# Patient Record
Sex: Female | Born: 1965 | Race: White | Hispanic: No | Marital: Married | Smoking: Current every day smoker
Health system: Southern US, Community
[De-identification: ages and names within clinical notes are randomized; demographics above are authoritative.]

## PROBLEM LIST (undated history)

## (undated) DIAGNOSIS — G4733 Obstructive sleep apnea (adult) (pediatric): Secondary | ICD-10-CM

## (undated) DIAGNOSIS — F17200 Nicotine dependence, unspecified, uncomplicated: Secondary | ICD-10-CM

## (undated) DIAGNOSIS — E039 Hypothyroidism, unspecified: Secondary | ICD-10-CM

## (undated) DIAGNOSIS — J9611 Chronic respiratory failure with hypoxia: Secondary | ICD-10-CM

## (undated) DIAGNOSIS — Z9981 Dependence on supplemental oxygen: Secondary | ICD-10-CM

## (undated) DIAGNOSIS — J449 Chronic obstructive pulmonary disease, unspecified: Secondary | ICD-10-CM

## (undated) DIAGNOSIS — Z6841 Body Mass Index (BMI) 40.0 and over, adult: Secondary | ICD-10-CM

## (undated) DIAGNOSIS — I10 Essential (primary) hypertension: Secondary | ICD-10-CM

## (undated) DIAGNOSIS — Z91148 Patient's other noncompliance with medication regimen for other reason: Secondary | ICD-10-CM

## (undated) DIAGNOSIS — I5030 Unspecified diastolic (congestive) heart failure: Secondary | ICD-10-CM

## (undated) DIAGNOSIS — J45909 Unspecified asthma, uncomplicated: Secondary | ICD-10-CM

## (undated) HISTORY — PX: CHOLECYSTECTOMY: SHX55

## (undated) HISTORY — PX: INNER EAR SURGERY: SHX679

## (undated) HISTORY — DX: Hypothyroidism, unspecified: E03.9

## (undated) HISTORY — PX: FOOT SURGERY: SHX648

## (undated) MED FILL — Medication: Fill #0 | Status: CN

---

## 2004-07-27 ENCOUNTER — Ambulatory Visit: Payer: Self-pay | Admitting: General Surgery

## 2005-09-23 ENCOUNTER — Ambulatory Visit: Payer: Self-pay | Admitting: Family Medicine

## 2005-09-23 ENCOUNTER — Other Ambulatory Visit: Payer: Self-pay

## 2005-10-01 ENCOUNTER — Ambulatory Visit: Payer: Self-pay | Admitting: Podiatry

## 2005-10-07 ENCOUNTER — Ambulatory Visit: Payer: Self-pay | Admitting: Family Medicine

## 2006-03-25 ENCOUNTER — Ambulatory Visit: Payer: Self-pay

## 2006-04-04 ENCOUNTER — Ambulatory Visit: Payer: Self-pay | Admitting: Unknown Physician Specialty

## 2006-05-02 ENCOUNTER — Ambulatory Visit: Payer: Self-pay | Admitting: Unknown Physician Specialty

## 2006-05-12 ENCOUNTER — Emergency Department: Payer: Self-pay | Admitting: Emergency Medicine

## 2009-02-02 ENCOUNTER — Emergency Department: Payer: Self-pay | Admitting: Emergency Medicine

## 2011-01-04 ENCOUNTER — Ambulatory Visit: Payer: Self-pay | Admitting: "Endocrinology

## 2012-09-27 ENCOUNTER — Ambulatory Visit: Payer: Self-pay

## 2013-12-12 ENCOUNTER — Ambulatory Visit: Payer: Self-pay | Admitting: Internal Medicine

## 2014-10-18 ENCOUNTER — Other Ambulatory Visit: Payer: Self-pay | Admitting: Internal Medicine

## 2014-10-18 DIAGNOSIS — R319 Hematuria, unspecified: Secondary | ICD-10-CM

## 2014-10-30 LAB — LIPID PANEL
Cholesterol: 193 mg/dL (ref 0–200)
HDL: 53 mg/dL (ref 35–70)
LDL CALC: 110 mg/dL
TRIGLYCERIDES: 152 mg/dL (ref 40–160)

## 2014-11-06 ENCOUNTER — Ambulatory Visit: Payer: Self-pay | Admitting: Internal Medicine

## 2014-12-16 ENCOUNTER — Other Ambulatory Visit: Payer: Self-pay

## 2014-12-16 ENCOUNTER — Ambulatory Visit
Admission: RE | Admit: 2014-12-16 | Discharge: 2014-12-16 | Disposition: A | Discharge status: Home / Self Care | Payer: Self-pay | Source: Ambulatory Visit | Attending: Internal Medicine | Admitting: Internal Medicine

## 2014-12-16 ENCOUNTER — Ambulatory Visit
Admission: RE | Admit: 2014-12-16 | Discharge: 2014-12-16 | Disposition: A | Discharge status: Home / Self Care | Payer: Self-pay | Source: Ambulatory Visit | Attending: *Deleted | Admitting: *Deleted

## 2014-12-16 DIAGNOSIS — K760 Fatty (change of) liver, not elsewhere classified: Secondary | ICD-10-CM | POA: Insufficient documentation

## 2014-12-16 DIAGNOSIS — R319 Hematuria, unspecified: Secondary | ICD-10-CM | POA: Insufficient documentation

## 2014-12-16 DIAGNOSIS — Z9049 Acquired absence of other specified parts of digestive tract: Secondary | ICD-10-CM | POA: Insufficient documentation

## 2014-12-16 HISTORY — DX: Unspecified asthma, uncomplicated: J45.909

## 2014-12-16 HISTORY — DX: Essential (primary) hypertension: I10

## 2014-12-16 MED ORDER — IOHEXOL 300 MG/ML  SOLN
100.0000 mL | Freq: Once | INTRAMUSCULAR | Status: AC | PRN
  Administered 2014-12-16: 100 mL via INTRAVENOUS

## 2015-02-05 ENCOUNTER — Other Ambulatory Visit: Payer: Self-pay

## 2015-02-12 ENCOUNTER — Ambulatory Visit: Payer: Self-pay | Admitting: Internal Medicine

## 2015-02-12 ENCOUNTER — Ambulatory Visit: Payer: Self-pay

## 2015-02-19 ENCOUNTER — Ambulatory Visit: Payer: Self-pay | Admitting: Internal Medicine

## 2015-02-27 ENCOUNTER — Ambulatory Visit: Payer: Self-pay | Admitting: Ophthalmology

## 2015-03-06 ENCOUNTER — Other Ambulatory Visit: Payer: Self-pay | Admitting: Urology

## 2015-03-06 ENCOUNTER — Ambulatory Visit: Payer: Self-pay | Admitting: Ophthalmology

## 2015-03-06 DIAGNOSIS — R9431 Abnormal electrocardiogram [ECG] [EKG]: Secondary | ICD-10-CM

## 2015-03-13 ENCOUNTER — Encounter
Admission: RE | Admit: 2015-03-13 | Discharge: 2015-03-13 | Disposition: A | Discharge status: Home / Self Care | Payer: Self-pay | Source: Ambulatory Visit | Attending: Urology | Admitting: Urology

## 2015-03-13 DIAGNOSIS — R9431 Abnormal electrocardiogram [ECG] [EKG]: Secondary | ICD-10-CM | POA: Insufficient documentation

## 2015-03-13 LAB — NM MYOCAR MULTI W/SPECT W/WALL MOTION / EF
CHL CUP NUCLEAR SDS: 0
CHL CUP NUCLEAR SSS: 0
CSEPHR: 50 %
LV dias vol: 76 mL
LV sys vol: 16 mL
Peak HR: 86 {beats}/min
Rest HR: 64 {beats}/min
SRS: 3
TID: 0.9

## 2015-03-13 MED ORDER — TECHNETIUM TC 99M SESTAMIBI - CARDIOLITE
13.1300 | Freq: Once | INTRAVENOUS | Status: AC | PRN
  Administered 2015-03-13: 09:00:00 13.13 via INTRAVENOUS

## 2015-03-13 MED ORDER — REGADENOSON 0.4 MG/5ML IV SOLN
0.4000 mg | Freq: Once | INTRAVENOUS | Status: AC
  Administered 2015-03-13: 0.4 mg via INTRAVENOUS

## 2015-03-13 MED ORDER — TECHNETIUM TC 99M SESTAMIBI - CARDIOLITE
29.5100 | Freq: Once | INTRAVENOUS | Status: AC | PRN
  Administered 2015-03-13: 10:00:00 29.51 via INTRAVENOUS

## 2015-03-24 ENCOUNTER — Encounter: Payer: PRIVATE HEALTH INSURANCE | Attending: Internal Medicine | Admitting: Dietician

## 2015-03-24 ENCOUNTER — Encounter: Payer: Self-pay | Admitting: Dietician

## 2015-03-24 DIAGNOSIS — Z713 Dietary counseling and surveillance: Secondary | ICD-10-CM | POA: Insufficient documentation

## 2015-03-24 NOTE — Progress Notes (Signed)
Medical Nutrition Therapy: Visit start time: 1030  end time: 1130  Assessment:  Diagnosis: weight management Past medical history: HTN, severe sleep apnea (using CPAP), hypothyroidism, GERD with hiatal hernia, delayed swallowing, no teeth.  Psychosocial issues/ stress concerns: stress due to living in boarding house; husband drinks Preferred learning method:  Jill Alexanders . Hands-on  Current weight: 187.7lbs  Height: 4'9.25" Medications, supplements: updated list in chart with patient Progress and evaluation: Patient reports recent issues with edema, and has now started on HCTZ. She could not recall the dose, so has not been updated in her medication list.         She is working to follow a low sodium diet, but reports difficulty due to limited finances (gets $173 in SNAP monthly) as well as having no teeth.          She and her husband (who is on disability) are currently living in a boarding house.          Lab report indicates elevated blood glucose of /dl on 1/61/09.   Physical activity: little, due to numbness in feet when she walks.   Dietary Intake:  Usual eating pattern includes 2 meals and 1-2 snacks per day. Dining out frequency: 0-1 meals per week.  Breakfast:  Toast, black coffee, or grits and eggs, occasionally bacon or sausage. sometimes none Snack: none Lunch: sometimes skips; or will eat french fries, or oodles of noodles with slice cheese Snack: none, sometimes naps Supper: fried pork chops with rice with sm amt butter usu no salt, can corn or beets; lasagna; usually no bread unless eaitng hot dogs; occasionally pancakes.  Snack: ranch chips, corn chips, bugles. Occasional ice cream or popcorn- hulless if available; honey-oatmeal-dry milk-peanut butter-balls.  Beverages: water, usually no sodas, some black coffee.   Nutrition Care Education: Topics covered: weight management Basic nutrition: basic food groups, appropriate nutrient balance, appropriate meal and snack  schedule Weight control: benefits of weight control, determining reasonable weight goal, 1200kcal meal plan for weight loss; importance of limiting added fats and sugars in foods. Advanced nutrition: cooking techniques, food label reading Hypertension: identifying high sodium foods, advised limit of  sodium per meal, identifying food sources of potassium, magnesium Other lifestyle changes:  benefits of making changes, options for including physical activity (i.e., chair exercises)  Nutritional Diagnosis:  [place]-3.3 Overweight/obesity As related to hypothyroidism, limited activity, history of excess caloric intake.  As evidenced by patient report, high BMI.  Intervention: Instruction as noted above.   Set goals to eat at regular intervals, eat balanced meals, control food portions, and limit or avoid fried foods.    Encouraged patient to incorporate some chair exercises as able.    Discussed low-cost options for protein sources and fruits and vegetables.   Education Materials given:  . Food lists/ Planning A Balanced Meal . Sample meal pattern/ menus: Quick and Healthy Meal Ideas . Snacking handout . Goals/ instructions  Learner/ who was taught:  . Patient   Level of understanding: . Partial understanding; needs review/ practice  Demonstrated degree of understanding via:   Teach back Learning barriers: . None  Willingness to learn/ readiness for change: . Eager, change in progress   Monitoring and Evaluation:  Dietary intake, exercise, fluid retention and BP control, and body weight      follow up: 04/28/15

## 2015-03-24 NOTE — Patient Instructions (Signed)
   Eat something every 4-5 hours  Include a protein, a healthy carb food, and a vegetable or fruit with each meal.  Do some chair exercises several times a day to boost calorie-burning, metabolism  Measure food portions; keep to 1 cup (fist-size) or less of starchy foods at any one meal. Include generous portions of vegetables to get full.   Cook foods by baking, or sauteing in a small amount of oil. Avoid deep-frying.

## 2015-03-26 ENCOUNTER — Ambulatory Visit: Payer: Self-pay

## 2015-04-02 ENCOUNTER — Ambulatory Visit: Payer: PRIVATE HEALTH INSURANCE | Admitting: Internal Medicine

## 2015-04-28 ENCOUNTER — Encounter: Payer: PRIVATE HEALTH INSURANCE | Attending: Internal Medicine | Admitting: Dietician

## 2015-04-28 DIAGNOSIS — Z713 Dietary counseling and surveillance: Secondary | ICD-10-CM | POA: Insufficient documentation

## 2015-04-28 NOTE — Patient Instructions (Signed)
   Keep a diary of what you eat, include details about portions and any seasoning, sauce, etc. Added.   Keep portions of starches to a fist-size (1 cup) or less. One handful is 1/3 - 1/2 cup. Palm-size portion of meat is about 3oz.   Contact [facility] or [employer] [facility] to see about meeting with a trainer for exercise.   Continue to eat 3 meals a day. Choose foods that are easy to chew without teeth, and make sure to grind and "chew" foods as much as possible before swallowing to improve digestion.

## 2015-04-28 NOTE — Progress Notes (Signed)
Medical Nutrition Therapy: Visit start time: 0930  end time: 1000  Assessment:  Diagnosis: obesity Medical history changes: no changes per patient Psychosocial issues/ stress concerns: no changes, continues to live in boarding house  Current weight: 187.7lbs  Height: 4'9.25" Medications, supplement changes: no changes Progress and evaluation: Patient reports eating smaller portions since previous visit, and avoiding fried foods.           She is including some vegetables or fruit when able to purchase them.          She has not yet increased exercise, but is hoping to meet with a fitness trainer for exercises that won't cause numbness in her legs.           She also reports irregular menses, is perimenopausal.  Physical activity: none  Dietary Intake:  Usual eating pattern includes 2-3 meals and 0-1 snacks per day. Dining out frequency: 0-1 meals per week.  Breakfast: usually oatmeal. Had a slice of cold pizza a few days ago. Occasionally boiled eggs and rice.  Snack: usually none Lunch: sandwich Snack: recently made no-bake oatmeal cookies Supper: husband often cooks. Hot dogs with sauerkraut and baked fries one time, beef stew with rice, variety. Reports feeling full more quickly Snack: sometimes Doritos, small bag, hot tea with milk and 2 tsp sugar, or 1pkt hot chocolate.  Beverages: mostly water  Nutrition Care Education: Topics covered: weight management Basic nutrition:  appropriate meal and snack schedule, general nutrition guidelines    Weight control: food portions, tracking food intake, options for low-cost exercise assistance Other: Discussed swallowing and chewing with no teeth, importance of cutting/chopping food into small bites, using liquids for swallowing, soft/moist foods.   Discussed effects of swallowing un-chewed foods on digestive health and GERD.   Nutritional Diagnosis:  [place]-3.3 Overweight/obesity As related to hypothyroidism, inactivity, history of excess  caloric intake.  As evidenced by patient report, high BMI.  Intervention: Discussion as noted above.    Encouraged patient to begin tracking food intake to increase awareness of eating and maintain control of food portions.    [facility] for [facility] week free use after meeting with trainer.    Education Materials given:  Marland Kitchen. Goals/ instructions  Learner/ who was taught:  . Patient   Level of understanding: Marland Kitchen. Verbalizes/ demonstrates competency   Demonstrated degree of understanding via:   Teach back Learning barriers: . None  Willingness to learn/ readiness for change: . Eager, change in progress   Monitoring and Evaluation:  Dietary intake, exercise, and body weight      follow up: 06/09/15

## 2015-06-09 ENCOUNTER — Ambulatory Visit: Payer: PRIVATE HEALTH INSURANCE | Admitting: Dietician

## 2015-06-16 ENCOUNTER — Encounter: Payer: PRIVATE HEALTH INSURANCE | Attending: Internal Medicine | Admitting: Dietician

## 2015-06-16 DIAGNOSIS — Z713 Dietary counseling and surveillance: Secondary | ICD-10-CM | POA: Insufficient documentation

## 2015-06-16 NOTE — Progress Notes (Signed)
Medical Nutrition Therapy: Visit start time: 1045  end time: 1115  Assessment:  Diagnosis: obesity Medical history changes: none per patient Psychosocial issues/ stress concerns: patient reports busy schedule recently  Current weight: 188.7lbs  Height: 4'9.25" Medications, supplement changes: reviewed list in chart with patient  Progress and evaluation: Patient reports being very busy in the past several weeks, unable to make more diet changes or begin exercise.          She also is unable to afford a varied diet due to tight budget constraints, but is hopeful for some additional income soon.         She reports some weight fluctuation since taking HCTZ.          She is planning to begin working with Systems analystpersonal trainer in January.   Physical activity: none  Dietary Intake:  Usual eating pattern includes 2-3 meals and 1-2 snacks per day. Dining out frequency: 0-1 meals per week.    Once in past 2 weeks -- burger and fries.   Breakfast: crackers or homemade apple turnover, coffee Snack: usually none Lunch: sometimes skips; sandwich or 1/2, or peanut butter crackers Snack: takes afternoon nap 1-3 hours  Sometimes graham crackers if available. Supper: usually baked foods. [facility] meal chicken, potato with butter, peas, corn, small slice pound cake. Could not eat all.      Baked meat with rice or baked potato, fish sticks, sometimes okra (not breaded), steamed vegetables when able to buy.  Snack: sometimes evening snack of 2-3 cookies or graham crackers; flavored oatmeal if wakes up hungry during the night Beverages: water, rarely koolaid with 1/2c sugar for 2 quarts. Coffee black in am 2-3 cups.  Nutrition Care Education: Topics covered: weight management Basic nutrition: appropriate meal and snack schedule, general nutrition guidelines    Weight control:  behavioral changes for weight loss: portion control, structured eating pattern Other lifestyle changes:  Benefits of regular  exercise  Nutritional Diagnosis:  [place]-3.3 Overweight/obesity As related to excess caloric intake, inactivity.  As evidenced by patient report, high BMI.  Intervention: Discussion as noted above.    Patient is having some difficulty implementing changes but has plans to begin with some changes starting in January.   She declined further follow-up at this time due to insurance coverage.   Education Materials given:  Marland Kitchen. Goals/ instructions  Food diary sheets   Learner/ who was taught:  . Patient   Level of understanding: Marland Kitchen. Verbalizes/ demonstrates competency   Demonstrated degree of understanding via:   Teach back Learning barriers: . None   Willingness to learn/ readiness for change: . Acceptance, ready for change   Monitoring and Evaluation:  Dietary intake, exercise, and body weight      follow up: prn

## 2015-06-16 NOTE — Patient Instructions (Signed)
   Keep food portions in control -- not more than 1 cup (ideally less) of starchy foods such as rice, pasta, and potatoes.   Plan to have something to eat every 3-5 hours during the day.   Keep a food diary, listing everything you eat and amounts. This will help you stay aware of what you eat throughout the day.   Contact a physical trainer in January to get started on some exercise.

## 2015-07-09 ENCOUNTER — Ambulatory Visit: Payer: PRIVATE HEALTH INSURANCE

## 2015-07-11 LAB — BASIC METABOLIC PANEL
BUN: 13 mg/dL (ref 4–21)
Creatinine: 0.9 mg/dL (ref <=1.1)
Glucose: 96 mg/dL
Potassium: 4 mmol/L (ref 3.4–5.3)
SODIUM: 139 mmol/L (ref 137–147)

## 2015-07-11 LAB — CBC AND DIFFERENTIAL
HEMATOCRIT: 42 % (ref 36–46)
HEMOGLOBIN: 14.5 g/dL (ref 12.0–16.0)
NEUTROS ABS: 53 /uL
Platelets: 243 10*3/uL (ref 150–399)
WBC: 6.9 10^3/mL

## 2015-07-11 LAB — TSH: TSH: 3.76 u[IU]/mL (ref <=5.90)

## 2015-07-11 LAB — HEMOGLOBIN A1C: HEMOGLOBIN A1C: 6.3

## 2015-07-16 ENCOUNTER — Other Ambulatory Visit: Payer: PRIVATE HEALTH INSURANCE

## 2015-07-23 ENCOUNTER — Ambulatory Visit: Payer: PRIVATE HEALTH INSURANCE | Admitting: Internal Medicine

## 2015-07-23 DIAGNOSIS — G4730 Sleep apnea, unspecified: Secondary | ICD-10-CM | POA: Insufficient documentation

## 2015-07-23 DIAGNOSIS — I10 Essential (primary) hypertension: Secondary | ICD-10-CM | POA: Insufficient documentation

## 2015-07-23 DIAGNOSIS — E039 Hypothyroidism, unspecified: Secondary | ICD-10-CM | POA: Insufficient documentation

## 2015-07-23 LAB — HEPATIC FUNCTION PANEL
ALT: 64 U/L — AB (ref 7–35)
AST: 52 U/L — AB (ref 13–35)
Alkaline Phosphatase: 70 U/L (ref 25–125)
BILIRUBIN, TOTAL: 0.3 mg/dL

## 2015-08-13 ENCOUNTER — Ambulatory Visit: Payer: PRIVATE HEALTH INSURANCE | Admitting: Urology

## 2015-08-13 ENCOUNTER — Ambulatory Visit: Payer: PRIVATE HEALTH INSURANCE | Admitting: Internal Medicine

## 2015-08-20 ENCOUNTER — Ambulatory Visit: Payer: PRIVATE HEALTH INSURANCE

## 2015-08-27 ENCOUNTER — Ambulatory Visit: Payer: PRIVATE HEALTH INSURANCE

## 2015-08-29 DIAGNOSIS — G4730 Sleep apnea, unspecified: Secondary | ICD-10-CM

## 2015-08-29 DIAGNOSIS — E039 Hypothyroidism, unspecified: Secondary | ICD-10-CM

## 2015-08-29 DIAGNOSIS — I10 Essential (primary) hypertension: Secondary | ICD-10-CM

## 2015-09-02 ENCOUNTER — Other Ambulatory Visit: Payer: Self-pay

## 2015-09-02 DIAGNOSIS — R319 Hematuria, unspecified: Secondary | ICD-10-CM

## 2015-09-03 ENCOUNTER — Ambulatory Visit: Payer: PRIVATE HEALTH INSURANCE

## 2015-09-03 DIAGNOSIS — R319 Hematuria, unspecified: Secondary | ICD-10-CM

## 2015-09-04 LAB — URINALYSIS
Bilirubin, UA: NEGATIVE
Glucose, UA: NEGATIVE
Ketones, UA: NEGATIVE
LEUKOCYTES UA: NEGATIVE
Nitrite, UA: NEGATIVE
PH UA: 6.5 (ref 5.0–7.5)
PROTEIN UA: NEGATIVE
RBC UA: NEGATIVE
SPEC GRAV UA: 1.011 (ref 1.005–1.030)
Urobilinogen, Ur: 0.2 mg/dL (ref 0.2–1.0)

## 2015-10-15 ENCOUNTER — Other Ambulatory Visit: Payer: PRIVATE HEALTH INSURANCE

## 2015-10-15 DIAGNOSIS — E079 Disorder of thyroid, unspecified: Secondary | ICD-10-CM

## 2015-10-15 DIAGNOSIS — E119 Type 2 diabetes mellitus without complications: Secondary | ICD-10-CM

## 2015-10-22 ENCOUNTER — Ambulatory Visit: Payer: PRIVATE HEALTH INSURANCE | Admitting: Internal Medicine

## 2015-10-22 ENCOUNTER — Encounter: Payer: Self-pay | Admitting: Internal Medicine

## 2015-10-22 VITALS — BP 144/84 | HR 72 | Temp 97.9°F | Wt 188.0 lb

## 2015-10-22 DIAGNOSIS — E119 Type 2 diabetes mellitus without complications: Secondary | ICD-10-CM

## 2015-10-22 DIAGNOSIS — I10 Essential (primary) hypertension: Secondary | ICD-10-CM

## 2015-10-22 DIAGNOSIS — R7303 Prediabetes: Secondary | ICD-10-CM | POA: Insufficient documentation

## 2015-10-22 DIAGNOSIS — E039 Hypothyroidism, unspecified: Secondary | ICD-10-CM

## 2015-10-22 LAB — CBC WITH DIFFERENTIAL
BASOS: 1 %
Basophils Absolute: 0 10*3/uL (ref 0.0–0.2)
EOS (ABSOLUTE): 0.1 10*3/uL (ref 0.0–0.4)
EOS: 2 %
HEMATOCRIT: 40.8 % (ref 34.0–46.6)
HEMOGLOBIN: 13.8 g/dL (ref 11.1–15.9)
IMMATURE GRANULOCYTES: 1 %
Immature Grans (Abs): 0 10*3/uL (ref 0.0–0.1)
Lymphocytes Absolute: 2.2 10*3/uL (ref 0.7–3.1)
Lymphs: 33 %
MCH: 29.4 pg (ref 26.6–33.0)
MCHC: 33.8 g/dL (ref 31.5–35.7)
MCV: 87 fL (ref 79–97)
MONOCYTES: 5 %
MONOS ABS: 0.4 10*3/uL (ref 0.1–0.9)
NRBC: 0 % (ref 0–0)
Neutrophils Absolute: 3.9 10*3/uL (ref 1.4–7.0)
Neutrophils: 58 %
RBC: 4.69 x10E6/uL (ref 3.77–5.28)
RDW: 14.2 % (ref 12.3–15.4)
WBC: 6.6 10*3/uL (ref 3.4–10.8)

## 2015-10-22 LAB — COMPREHENSIVE METABOLIC PANEL
ALBUMIN: 4.4 g/dL (ref 3.5–5.5)
ALT: 52 IU/L — AB (ref 0–32)
AST: 35 IU/L (ref 0–40)
Albumin/Globulin Ratio: 1.7 (ref 1.2–2.2)
Alkaline Phosphatase: 64 IU/L (ref 39–117)
BUN / CREAT RATIO: 11 (ref 9–23)
BUN: 10 mg/dL (ref 6–24)
Bilirubin Total: 0.3 mg/dL (ref 0.0–1.2)
CALCIUM: 9.4 mg/dL (ref 8.7–10.2)
CHLORIDE: 100 mmol/L (ref 96–106)
CO2: 25 mmol/L (ref 18–29)
CREATININE: 0.89 mg/dL (ref 0.57–1.00)
GFR, EST AFRICAN AMERICAN: 86 mL/min/{1.73_m2} (ref >59)
GFR, EST NON AFRICAN AMERICAN: 75 mL/min/{1.73_m2} (ref >59)
GLUCOSE: 110 mg/dL — AB (ref 65–99)
Globulin, Total: 2.6 g/dL (ref 1.5–4.5)
Potassium: 4.3 mmol/L (ref 3.5–5.2)
Sodium: 142 mmol/L (ref 134–144)
TOTAL PROTEIN: 7 g/dL (ref 6.0–8.5)

## 2015-10-22 LAB — SEDIMENTATION RATE

## 2015-10-22 LAB — HEMOGLOBIN A1C
Est. average glucose Bld gHb Est-mCnc: 140 mg/dL
Hgb A1c MFr Bld: 6.5 % — ABNORMAL HIGH (ref 4.8–5.6)

## 2015-10-22 LAB — GLUCOSE, POCT (MANUAL RESULT ENTRY): POC GLUCOSE: 140 mg/dL — AB (ref 70–99)

## 2015-10-22 LAB — UA/M W/RFLX CULTURE, ROUTINE

## 2015-10-22 LAB — TSH: TSH: 2.43 u[IU]/mL (ref 0.450–4.500)

## 2015-10-22 MED ORDER — HYDROCHLOROTHIAZIDE 25 MG PO TABS: 25.0000 mg | ORAL_TABLET | Freq: Every day | ORAL | Status: DC

## 2015-10-22 NOTE — Assessment & Plan Note (Signed)
At target. 

## 2015-10-22 NOTE — Assessment & Plan Note (Signed)
Stable

## 2015-10-22 NOTE — Assessment & Plan Note (Signed)
A1c at 6.5. Pt is getting more physical activity. Taking a prediabetic class.

## 2015-10-22 NOTE — Progress Notes (Signed)
   Subjective:    Patient ID: Lindsey Benjamin, female    DOB: [birth year], 50 y.o.   MRN: [id number]  HPI  Patient Active Problem List   Diagnosis Date Noted  . Essential hypertension 07/23/2015  . Sleep apnea 07/23/2015  . Hypothyroidism 07/23/2015   Pt presents with a f/u for hypertension. BP this morning was 144/88.  Pt is in a pre-diabetic class at the [facility]. Pt is getting education on nutrition and physical activity. Pt blood sugar this morning was good. Pt has numbness in her legs.    Review of Systems     Objective:   Physical Exam  Constitutional: She is oriented to person, place, and time.  Cardiovascular: Normal rate, regular rhythm and normal heart sounds.   Pulmonary/Chest: Effort normal and breath sounds normal.  Neurological: She is alert and oriented to person, place, and time.      Medication List       This list is accurate as of: 10/22/15  9:55 AM.  Always use your most recent med list.               albuterol (2.5 MG/3ML) 0.083% nebulizer solution  Commonly known as:  PROVENTIL  Take 2.5 mg by nebulization every 6 (six) hours as needed for wheezing or shortness of breath.     amLODipine 5 MG tablet  Commonly known as:  NORVASC  Take 5 mg by mouth.     cetirizine 5 MG tablet  Commonly known as:  ZYRTEC  Take 5 mg by mouth as needed for allergies. Reported on 10/22/2015     FLUoxetine 10 MG capsule  Commonly known as:  PROZAC  Take 10 mg by mouth.     Fluticasone-Salmeterol 100-50 MCG/DOSE Aepb  Commonly known as:  ADVAIR  Inhale 1 puff into the lungs 2 (two) times daily. Reported on 10/22/2015     hydrochlorothiazide 25 MG tablet  Commonly known as:  HYDRODIURIL  Take 25 mg by mouth daily.     levothyroxine 125 MCG tablet  Commonly known as:  SYNTHROID, LEVOTHROID  Take 125 mcg by mouth daily before breakfast.     LORazepam 1 MG tablet  Commonly known as:  ATIVAN  Take 1 mg by mouth.       BP 144/84 mmHg  Pulse 72   Temp(Src) 97.9 F (36.6 C)  Wt 188 lb (85.276 kg)       Assessment & Plan:  Essential hypertension Stable.  Hypothyroidism At target  Prediabetes A1c at 6.5. Pt is getting more physical activity. Taking a prediabetic class.     MD f/u: 3 months Met c, CBC, lipid, a1c

## 2015-11-10 ENCOUNTER — Emergency Department
Admission: EM | Admit: 2015-11-10 | Discharge: 2015-11-10 | Disposition: A | Discharge status: Home / Self Care | Payer: Self-pay | Attending: Emergency Medicine | Admitting: Emergency Medicine

## 2015-11-10 ENCOUNTER — Emergency Department: Payer: Self-pay

## 2015-11-10 ENCOUNTER — Encounter: Payer: Self-pay | Admitting: Emergency Medicine

## 2015-11-10 DIAGNOSIS — E039 Hypothyroidism, unspecified: Secondary | ICD-10-CM | POA: Insufficient documentation

## 2015-11-10 DIAGNOSIS — Z79899 Other long term (current) drug therapy: Secondary | ICD-10-CM | POA: Insufficient documentation

## 2015-11-10 DIAGNOSIS — I10 Essential (primary) hypertension: Secondary | ICD-10-CM | POA: Insufficient documentation

## 2015-11-10 DIAGNOSIS — Z7951 Long term (current) use of inhaled steroids: Secondary | ICD-10-CM | POA: Insufficient documentation

## 2015-11-10 DIAGNOSIS — F17210 Nicotine dependence, cigarettes, uncomplicated: Secondary | ICD-10-CM | POA: Insufficient documentation

## 2015-11-10 DIAGNOSIS — J40 Bronchitis, not specified as acute or chronic: Secondary | ICD-10-CM | POA: Insufficient documentation

## 2015-11-10 MED ORDER — AZITHROMYCIN 250 MG PO TABS
ORAL_TABLET | ORAL | Status: AC
Start: 2015-11-10 — End: 2015-11-15

## 2015-11-10 MED ORDER — PSEUDOEPH-BROMPHEN-DM 30-2-10 MG/5ML PO SYRP: 5.0000 mL | ORAL_SOLUTION | Freq: Four times a day (QID) | ORAL | Status: DC | PRN

## 2015-11-10 NOTE — ED Provider Notes (Signed)
[facility] Emergency Department Provider Note   ____________________________________________  Time seen: Approximately 7:26 AM  I have reviewed the triage vital signs and the nursing notes.   HISTORY  Chief Complaint URI    HPI Lindsey Benjamin is a 50 y.o. female patient complaining of productive cough for 2 weeks. Patient states only mild transient relief with over-the-counter cough medications. Patient denies any fever or chills associated with this complaint. Patient denies any nausea vomiting diarrhea. Patient rates her pain discomfort as 8/10. No other palliative measures for this complaint.   Past Medical History  Diagnosis Date  . Asthma   . Hypertension   . Hypothyroidism     Patient Active Problem List   Diagnosis Date Noted  . Prediabetes 10/22/2015  . Essential hypertension 07/23/2015  . Sleep apnea 07/23/2015  . Hypothyroidism 07/23/2015    History reviewed. No pertinent past surgical history.  Current Outpatient Rx  Name  Route  Sig  Dispense  Refill  . albuterol (PROVENTIL) (2.5 MG/3ML) 0.083% nebulizer solution   Nebulization   Take 2.5 mg by nebulization every 6 (six) hours as needed for wheezing or shortness of breath.         Marland Kitchen amLODipine (NORVASC) 5 MG tablet   Oral   Take 5 mg by mouth.         Marland Kitchen azithromycin (ZITHROMAX Z-PAK) 250 MG tablet      Take 2 tablets (500 mg) on  Day 1,  followed by 1 tablet (250 mg) once daily on Days 2 through 5.   6 each   0   . brompheniramine-pseudoephedrine-DM 30-2-10 MG/5ML syrup   Oral   Take 5 mLs by mouth 4 (four) times daily as needed.   120 mL   0   . cetirizine (ZYRTEC) 5 MG tablet   Oral   Take 5 mg by mouth as needed for allergies. Reported on 10/22/2015         . FLUoxetine (PROZAC) 10 MG capsule   Oral   Take 10 mg by mouth.         . Fluticasone-Salmeterol (ADVAIR) 100-50 MCG/DOSE AEPB   Inhalation   Inhale 1 puff into the lungs 2 (two) times  daily. Reported on 10/22/2015         . hydrochlorothiazide (HYDRODIURIL) 25 MG tablet   Oral   Take 1 tablet (25 mg total) by mouth daily.   90 tablet   3   . levothyroxine (SYNTHROID, LEVOTHROID) 125 MCG tablet   Oral   Take 125 mcg by mouth daily before breakfast.         . LORazepam (ATIVAN) 1 MG tablet   Oral   Take 1 mg by mouth.           Allergies Ceclor; Levaquin; Penicillins; and Chamomile  History reviewed. No pertinent family history.  Social History Social History  Substance Use Topics  . Smoking status: Current Every Day Smoker -- 0.50 packs/day    Types: Cigarettes  . Smokeless tobacco: None  . Alcohol Use: 0.0 - 0.6 oz/week    0-1 Standard drinks or equivalent per week     Comment: occasionally    Review of Systems Constitutional: No fever/chills Eyes: No visual changes. ENT: No sore throat. Cardiovascular: Denies chest pain. Respiratory: Denies shortness of breath. Gastrointestinal: No abdominal pain.  No nausea, no vomiting.  No diarrhea.  No constipation. Genitourinary: Negative for dysuria. Musculoskeletal: Negative for back pain. Skin: Negative for rash.  Neurological: Negative for headaches, focal weakness or numbness. Endocrine:Hypertension, diabetes, and hypothyroidism. Allergic/Immunilogical: See medication list ____________________________________________   PHYSICAL EXAM:  VITAL SIGNS: ED Triage Vitals  Enc Vitals Group     BP 11/10/15 0709 136/88 mmHg     Pulse Rate 11/10/15 0709 78     Resp 11/10/15 0709 20     Temp 11/10/15 0709 98.2 F (36.8 C)     Temp Source 11/10/15 0709 Oral     SpO2 11/10/15 0709 97 %     Weight 11/10/15 0709 182 lb (82.555 kg)     Height 11/10/15 0709 4\' 9"  (1.448 m)     Head Cir --      Peak Flow --      Pain Score 11/10/15 0709 8     Pain Loc --      Pain Edu? --      Excl. in GC? --     Constitutional: Alert and oriented. Well appearing and in no acute distress. Eyes: Conjunctivae are  normal. PERRL. EOMI. Head: Atraumatic. Nose: No congestion/rhinnorhea. Mouth/Throat: Mucous membranes are moist.  Oropharynx non-erythematous. Neck: No stridor.  No cervical spine tenderness to palpation. Hematological/Lymphatic/Immunilogical: No cervical lymphadenopathy. Cardiovascular: Normal rate, regular rhythm. Grossly normal heart sounds.  Good peripheral circulation. Respiratory: Normal respiratory effort.  No retractions. Lungs bilateral  rhonchi  Gastrointestinal: Soft and nontender. No distention. No abdominal bruits. No CVA tenderness. Musculoskeletal: No lower extremity tenderness nor edema.  No joint effusions. Neurologic:  Normal speech and language. No gross focal neurologic deficits are appreciated. No gait instability. Skin:  Skin is warm, dry and intact. No rash noted. Psychiatric: Mood and affect are normal. Speech and behavior are normal.  ____________________________________________   LABS (all labs ordered are listed, but only abnormal results are displayed)  Labs Reviewed - No data to display ____________________________________________  EKG   ____________________________________________  RADIOLOGY  No acute findings on chest x-ray. ____________________________________________   PROCEDURES  Procedure(s) performed: None  Critical Care performed: No  ____________________________________________   INITIAL IMPRESSION / ASSESSMENT AND PLAN / ED COURSE  Pertinent labs & imaging results that were available during my care of the patient were reviewed by me and considered in my medical decision making (see chart for details).  Bronchitis. Discussed x-ray finding with patient. Patient given a prescription for Zithromax and Bromfed-DM. Patient advised follow-up with family doctor if no improvement 3-5 days. ER condition worsens. ____________________________________________   FINAL CLINICAL IMPRESSION(S) / ED DIAGNOSES  Final diagnoses:  Bronchitis       NEW MEDICATIONS STARTED DURING THIS VISIT:  New Prescriptions   AZITHROMYCIN (ZITHROMAX Z-PAK) 250 MG TABLET    Take 2 tablets (500 mg) on  Day 1,  followed by 1 tablet (250 mg) once daily on Days 2 through 5.   BROMPHENIRAMINE-PSEUDOEPHEDRINE-DM 30-2-10 MG/5ML SYRUP    Take 5 mLs by mouth 4 (four) times daily as needed.     Note:  This document was prepared using Dragon voice recognition software and may include unintentional dictation errors.     Joni Reiningonald K , PA-C 11/10/15 [id number]  Governor Rooksebecca Lord, MD 11/10/15 [phone]

## 2015-11-10 NOTE — ED Notes (Signed)
Pt c/o cold, cough symptoms x 2 weeks; has taken otc cold meds with no relief.

## 2015-11-10 NOTE — ED Notes (Signed)
Cough for about 2 weeks   States min relief with use of her inhaler unsure of fever  And states cough is occasional prod  Clear phlegm

## 2015-12-03 ENCOUNTER — Other Ambulatory Visit: Payer: Self-pay | Admitting: Ophthalmology

## 2015-12-03 DIAGNOSIS — I159 Secondary hypertension, unspecified: Secondary | ICD-10-CM

## 2015-12-03 DIAGNOSIS — G4730 Sleep apnea, unspecified: Secondary | ICD-10-CM

## 2016-01-14 ENCOUNTER — Other Ambulatory Visit: Payer: PRIVATE HEALTH INSURANCE

## 2016-01-14 DIAGNOSIS — R7303 Prediabetes: Secondary | ICD-10-CM

## 2016-01-14 DIAGNOSIS — I10 Essential (primary) hypertension: Secondary | ICD-10-CM

## 2016-01-15 LAB — CBC WITH DIFFERENTIAL/PLATELET
BASOS ABS: 0 10*3/uL (ref 0.0–0.2)
Basos: 1 %
EOS (ABSOLUTE): 0.1 10*3/uL (ref 0.0–0.4)
EOS: 2 %
HEMATOCRIT: 43.1 % (ref 34.0–46.6)
HEMOGLOBIN: 14.4 g/dL (ref 11.1–15.9)
IMMATURE GRANS (ABS): 0 10*3/uL (ref 0.0–0.1)
IMMATURE GRANULOCYTES: 0 %
LYMPHS: 34 %
Lymphocytes Absolute: 2.3 10*3/uL (ref 0.7–3.1)
MCH: 29.8 pg (ref 26.6–33.0)
MCHC: 33.4 g/dL (ref 31.5–35.7)
MCV: 89 fL (ref 79–97)
MONOCYTES: 9 %
Monocytes Absolute: 0.6 10*3/uL (ref 0.1–0.9)
NEUTROS PCT: 54 %
Neutrophils Absolute: 3.7 10*3/uL (ref 1.4–7.0)
Platelets: 206 10*3/uL (ref 150–379)
RBC: 4.83 x10E6/uL (ref 3.77–5.28)
RDW: 14.2 % (ref 12.3–15.4)
WBC: 6.7 10*3/uL (ref 3.4–10.8)

## 2016-01-15 LAB — COMPREHENSIVE METABOLIC PANEL
A/G RATIO: 1.7 (ref 1.2–2.2)
ALBUMIN: 4.3 g/dL (ref 3.5–5.5)
ALK PHOS: 61 IU/L (ref 39–117)
ALT: 50 IU/L — ABNORMAL HIGH (ref 0–32)
AST: 42 IU/L — ABNORMAL HIGH (ref 0–40)
BILIRUBIN TOTAL: 0.3 mg/dL (ref 0.0–1.2)
BUN / CREAT RATIO: 16 (ref 9–23)
BUN: 13 mg/dL (ref 6–24)
CHLORIDE: 97 mmol/L (ref 96–106)
CO2: 22 mmol/L (ref 18–29)
Calcium: 8.9 mg/dL (ref 8.7–10.2)
Creatinine, Ser: 0.82 mg/dL (ref 0.57–1.00)
GFR calc non Af Amer: 83 mL/min/{1.73_m2} (ref >59)
GFR, EST AFRICAN AMERICAN: 95 mL/min/{1.73_m2} (ref >59)
GLOBULIN, TOTAL: 2.5 g/dL (ref 1.5–4.5)
Glucose: 97 mg/dL (ref 65–99)
Potassium: 4.4 mmol/L (ref 3.5–5.2)
SODIUM: 138 mmol/L (ref 134–144)
TOTAL PROTEIN: 6.8 g/dL (ref 6.0–8.5)

## 2016-01-15 LAB — HEMOGLOBIN A1C
Est. average glucose Bld gHb Est-mCnc: 117 mg/dL
Hgb A1c MFr Bld: 5.7 % — ABNORMAL HIGH (ref 4.8–5.6)

## 2016-01-15 LAB — LIPID PANEL
CHOL/HDL RATIO: 4.2 ratio (ref 0.0–4.4)
CHOLESTEROL TOTAL: 208 mg/dL — AB (ref 100–199)
HDL: 49 mg/dL (ref >39)
LDL CALC: 131 mg/dL — AB (ref 0–99)
TRIGLYCERIDES: 141 mg/dL (ref 0–149)
VLDL Cholesterol Cal: 28 mg/dL (ref 5–40)

## 2016-01-20 ENCOUNTER — Ambulatory Visit: Payer: PRIVATE HEALTH INSURANCE

## 2016-01-20 ENCOUNTER — Ambulatory Visit: Payer: Disability Insurance | Attending: Ophthalmology

## 2016-01-20 DIAGNOSIS — G4730 Sleep apnea, unspecified: Secondary | ICD-10-CM

## 2016-01-20 DIAGNOSIS — I159 Secondary hypertension, unspecified: Secondary | ICD-10-CM | POA: Diagnosis not present

## 2016-01-21 ENCOUNTER — Other Ambulatory Visit: Payer: PRIVATE HEALTH INSURANCE

## 2016-01-28 ENCOUNTER — Ambulatory Visit: Payer: PRIVATE HEALTH INSURANCE | Admitting: Internal Medicine

## 2016-01-28 VITALS — BP 153/90 | HR 68 | Temp 98.1°F | Wt 184.0 lb

## 2016-01-28 DIAGNOSIS — R748 Abnormal levels of other serum enzymes: Secondary | ICD-10-CM

## 2016-01-28 NOTE — Patient Instructions (Signed)
Patient to follow up in 3 months with labs prior to [facility], CBC, A1C, Hep A, B & C immunity status.

## 2016-01-28 NOTE — Progress Notes (Signed)
   Subjective:    Patient ID: Lindsey Benjamin, female    DOB: 1965/08/26, 50 y.o.   MRN: [id number]  HPI  Patient Active Problem List   Diagnosis Date Noted  . Prediabetes 10/22/2015  . Essential hypertension 07/23/2015  . Sleep apnea 07/23/2015  . Hypothyroidism 07/23/2015   Patient complains of fatigue.   Review of Systems Blood glucose is doing better. Pulmonary function test appears normal. Patient has abnormal LFT test.    Objective:   Physical Exam  Constitutional: She is oriented to person, place, and time.  Cardiovascular: Normal rate, regular rhythm and normal heart sounds.   Pulmonary/Chest: Effort normal and breath sounds normal.  Neurological: She is alert and oriented to person, place, and time.      Medication List       Accurate as of 01/28/16 10:05 AM. Always use your most recent med list.          albuterol (2.5 MG/3ML) 0.083% nebulizer solution Commonly known as:  PROVENTIL Take 2.5 mg by nebulization every 6 (six) hours as needed for wheezing or shortness of breath.   amLODipine 5 MG tablet Commonly known as:  NORVASC Take 5 mg by mouth.   brompheniramine-pseudoephedrine-DM 30-2-10 MG/5ML syrup Take 5 mLs by mouth 4 (four) times daily as needed.   cetirizine 5 MG tablet Commonly known as:  ZYRTEC Take 5 mg by mouth as needed for allergies. Reported on 10/22/2015   FLUoxetine 10 MG capsule Commonly known as:  PROZAC Take 10 mg by mouth.   Fluticasone-Salmeterol 100-50 MCG/DOSE Aepb Commonly known as:  ADVAIR Inhale 1 puff into the lungs 2 (two) times daily. Reported on 10/22/2015   hydrochlorothiazide 25 MG tablet Commonly known as:  HYDRODIURIL Take 1 tablet (25 mg total) by mouth daily.   levothyroxine 125 MCG tablet Commonly known as:  SYNTHROID, LEVOTHROID Take 125 mcg by mouth daily before breakfast.   LORazepam 1 MG tablet Commonly known as:  ATIVAN Take 1 mg by mouth.      BP (!) 153/90   Pulse 68   Temp 98.1 F (36.7  C)   Wt 184 lb (83.5 kg)   BMI 39.82 kg/m        Assessment & Plan:  Patient to follow up in 3 months with labs prior to [facility], CBC, A1C, Hep A, B & C immunity status.

## 2016-01-29 NOTE — Addendum Note (Signed)
Addended by: Bo Mcclintock on: 01/29/2016 05:29 PM   Modules accepted: Orders

## 2016-03-11 ENCOUNTER — Ambulatory Visit: Payer: Self-pay | Admitting: Ophthalmology

## 2016-03-11 ENCOUNTER — Ambulatory Visit: Payer: PRIVATE HEALTH INSURANCE | Admitting: Ophthalmology

## 2016-05-05 ENCOUNTER — Other Ambulatory Visit: Payer: PRIVATE HEALTH INSURANCE

## 2016-05-12 ENCOUNTER — Ambulatory Visit: Payer: PRIVATE HEALTH INSURANCE | Admitting: Internal Medicine

## 2016-05-12 ENCOUNTER — Other Ambulatory Visit: Payer: PRIVATE HEALTH INSURANCE

## 2016-05-13 ENCOUNTER — Other Ambulatory Visit: Payer: PRIVATE HEALTH INSURANCE

## 2016-05-13 DIAGNOSIS — R748 Abnormal levels of other serum enzymes: Secondary | ICD-10-CM

## 2016-05-14 LAB — CBC WITH DIFFERENTIAL/PLATELET
BASOS ABS: 0 10*3/uL (ref 0.0–0.2)
Basos: 1 %
EOS (ABSOLUTE): 0.1 10*3/uL (ref 0.0–0.4)
Eos: 2 %
HEMOGLOBIN: 14.2 g/dL (ref 11.1–15.9)
Hematocrit: 42.6 % (ref 34.0–46.6)
IMMATURE GRANS (ABS): 0 10*3/uL (ref 0.0–0.1)
Immature Granulocytes: 0 %
LYMPHS: 34 %
Lymphocytes Absolute: 2.2 10*3/uL (ref 0.7–3.1)
MCH: 29.3 pg (ref 26.6–33.0)
MCHC: 33.3 g/dL (ref 31.5–35.7)
MCV: 88 fL (ref 79–97)
MONOCYTES: 7 %
Monocytes Absolute: 0.4 10*3/uL (ref 0.1–0.9)
NEUTROS ABS: 3.7 10*3/uL (ref 1.4–7.0)
Neutrophils: 56 %
Platelets: 231 10*3/uL (ref 150–379)
RBC: 4.84 x10E6/uL (ref 3.77–5.28)
RDW: 14.4 % (ref 12.3–15.4)
WBC: 6.4 10*3/uL (ref 3.4–10.8)

## 2016-05-14 LAB — HEPATITIS A ANTIBODY, IGM: Hep A IgM: NEGATIVE

## 2016-05-14 LAB — COMPREHENSIVE METABOLIC PANEL
ALT: 41 IU/L — AB (ref 0–32)
AST: 37 IU/L (ref 0–40)
Albumin/Globulin Ratio: 1.6 (ref 1.2–2.2)
Albumin: 4.4 g/dL (ref 3.5–5.5)
Alkaline Phosphatase: 65 IU/L (ref 39–117)
BILIRUBIN TOTAL: 0.4 mg/dL (ref 0.0–1.2)
BUN/Creatinine Ratio: 13 (ref 9–23)
BUN: 11 mg/dL (ref 6–24)
CALCIUM: 9.1 mg/dL (ref 8.7–10.2)
CHLORIDE: 95 mmol/L — AB (ref 96–106)
CO2: 26 mmol/L (ref 18–29)
CREATININE: 0.84 mg/dL (ref 0.57–1.00)
GFR, EST AFRICAN AMERICAN: 92 mL/min/{1.73_m2} (ref >59)
GFR, EST NON AFRICAN AMERICAN: 80 mL/min/{1.73_m2} (ref >59)
GLUCOSE: 137 mg/dL — AB (ref 65–99)
Globulin, Total: 2.8 g/dL (ref 1.5–4.5)
Potassium: 3.5 mmol/L (ref 3.5–5.2)
Sodium: 138 mmol/L (ref 134–144)
TOTAL PROTEIN: 7.2 g/dL (ref 6.0–8.5)

## 2016-05-14 LAB — HEMOGLOBIN A1C
ESTIMATED AVERAGE GLUCOSE: 126 mg/dL
HEMOGLOBIN A1C: 6 % — AB (ref 4.8–5.6)

## 2016-05-14 LAB — HEPATITIS B CORE ANTIBODY, IGM: HEP B C IGM: NEGATIVE

## 2016-05-14 LAB — HEPATITIS C ANTIBODY

## 2016-05-19 ENCOUNTER — Encounter: Payer: Self-pay | Admitting: Internal Medicine

## 2016-05-19 ENCOUNTER — Ambulatory Visit: Payer: PRIVATE HEALTH INSURANCE | Admitting: Internal Medicine

## 2016-05-19 VITALS — BP 186/99 | HR 98 | Temp 97.7°F | Wt 187.0 lb

## 2016-05-19 DIAGNOSIS — I10 Essential (primary) hypertension: Secondary | ICD-10-CM

## 2016-05-19 MED ORDER — AMLODIPINE BESYLATE 5 MG PO TABS: 10.0000 mg | ORAL_TABLET | Freq: Every day | ORAL | 1 refills | Status: DC

## 2016-05-19 NOTE — Patient Instructions (Addendum)
  BP medication increased to 10mg  from 5mg  because of increase in BP and intolerance to ACE inhibitors because of cough.    Pt f/u in 1 month w/ labs

## 2016-05-19 NOTE — Progress Notes (Signed)
   Subjective:    Patient ID: Lindsey Benjamin, female    DOB: 08/25/1965, 50 y.o.   MRN: [id number]  HPI   Pt f/u for lab results.  Pt reports [facility] on [facility]iat[facility]. Pt BP elevated at 186/99.  No other complaints.   Patient Active Problem List   Diagnosis Date Noted  . Prediabetes 10/22/2015  . Essential hypertension 07/23/2015  . Sleep apnea 07/23/2015  . Hypothyroidism 07/23/2015   Current Outpatient Prescriptions on File Prior to Visit  Medication Sig Dispense Refill  . albuterol (PROVENTIL) (2.5 MG/3ML) 0.083% nebulizer solution Take 2.5 mg by nebulization every 6 (six) hours as needed for wheezing or shortness of breath.    Marland Kitchen. amLODipine (NORVASC) 5 MG tablet Take 5 mg by mouth.    . cetirizine (ZYRTEC) 5 MG tablet Take 5 mg by mouth as needed for allergies. Reported on 10/22/2015    . FLUoxetine (PROZAC) 10 MG capsule Take 10 mg by mouth.    . Fluticasone-Salmeterol (ADVAIR) 100-50 MCG/DOSE AEPB Inhale 1 puff into the lungs 2 (two) times daily. Reported on 10/22/2015    . hydrochlorothiazide (HYDRODIURIL) 25 MG tablet Take 1 tablet (25 mg total) by mouth daily. 90 tablet 3  . levothyroxine (SYNTHROID, LEVOTHROID) 125 MCG tablet Take 125 mcg by mouth daily before breakfast.    . LORazepam (ATIVAN) 1 MG tablet Take 1 mg by mouth.     No current facility-administered medications on file prior to visit.      Review of Systems     Objective:   Physical Exam  Constitutional: She is oriented to person, place, and time.  Cardiovascular: Normal rate, regular rhythm and normal heart sounds.   Pulmonary/Chest: Effort normal and breath sounds normal.  Neurological: She is alert and oriented to person, place, and time.    BP (!) 186/99   Pulse 98   Temp 97.7 F (36.5 C) (Oral)   Wt 187 lb (84.8 kg)   LMP 11/17/2015 (Within Weeks)   BMI 40.47 kg/m   BP rechecked at 148/94.     Assessment & Plan:   BP medication increased to 10mg   because of increase in BP and intolerance to ACE inhibitors because of cough.    Pt f/u in 1 month w/ labs: T4/TSH, Clean catch Ua, UA culture  Please have her note and lab results sent to [facility].

## 2016-06-16 ENCOUNTER — Other Ambulatory Visit: Payer: PRIVATE HEALTH INSURANCE

## 2016-06-16 DIAGNOSIS — I10 Essential (primary) hypertension: Secondary | ICD-10-CM

## 2016-06-17 ENCOUNTER — Other Ambulatory Visit: Payer: PRIVATE HEALTH INSURANCE

## 2016-06-17 DIAGNOSIS — I10 Essential (primary) hypertension: Secondary | ICD-10-CM

## 2016-06-17 LAB — URINALYSIS, ROUTINE W REFLEX MICROSCOPIC
Bilirubin, UA: NEGATIVE
GLUCOSE, UA: NEGATIVE
KETONES UA: NEGATIVE
LEUKOCYTES UA: NEGATIVE
Nitrite, UA: NEGATIVE
PROTEIN UA: NEGATIVE
RBC, UA: NEGATIVE
SPEC GRAV UA: 1.007 (ref 1.005–1.030)
Urobilinogen, Ur: 0.2 mg/dL (ref 0.2–1.0)
pH, UA: 6 (ref 5.0–7.5)

## 2016-06-18 LAB — T3 UPTAKE: T3 Uptake Ratio: 28 % (ref 24–39)

## 2016-06-18 LAB — TSH: TSH: 7.98 u[IU]/mL — ABNORMAL HIGH (ref 0.450–4.500)

## 2016-06-23 ENCOUNTER — Encounter: Payer: Self-pay | Admitting: Internal Medicine

## 2016-06-23 ENCOUNTER — Ambulatory Visit: Payer: PRIVATE HEALTH INSURANCE | Admitting: Internal Medicine

## 2016-06-23 VITALS — BP 128/84 | HR 70 | Temp 98.2°F | Wt 190.0 lb

## 2016-06-23 DIAGNOSIS — E7800 Pure hypercholesterolemia, unspecified: Secondary | ICD-10-CM

## 2016-06-23 DIAGNOSIS — E039 Hypothyroidism, unspecified: Secondary | ICD-10-CM

## 2016-06-23 NOTE — Patient Instructions (Signed)
RTC in 3 months labs met c, Ua, CBC, tsh, T4, and lipid.

## 2016-06-23 NOTE — Progress Notes (Signed)
   Subjective:    Patient ID: Lindsey Benjamin, female    DOB: January 22, 1966, 50 y.o.   MRN: [id number]  HPI BP (!) 160/87   Pulse 70   Temp 98.2 F (36.8 C) (Oral)   Wt 190 lb (86.2 kg)   LMP 12/23/2015 (Within Weeks)   BMI 41.12 kg/m   Allergies as of 06/23/2016      Reactions   Ceclor [cefaclor] Hives   Penicillins    Chamomile Rash   Levaquin [levofloxacin In D5w] Rash   Peel her skin   Tomato Rash      Medication List       Accurate as of 06/23/16 10:57 AM. Always use your most recent med list.          acetaminophen 500 MG tablet Commonly known as:  TYLENOL Take 500 mg by mouth every 6 (six) hours as needed.   albuterol (2.5 MG/3ML) 0.083% nebulizer solution Commonly known as:  PROVENTIL Take 2.5 mg by nebulization every 6 (six) hours as needed for wheezing or shortness of breath.   amLODipine 5 MG tablet Commonly known as:  NORVASC Take 2 tablets (10 mg total) by mouth daily.   cetirizine 5 MG tablet Commonly known as:  ZYRTEC Take 10 mg by mouth as needed for allergies. Reported on 10/22/2015   FLUoxetine 10 MG capsule Commonly known as:  PROZAC Take 10 mg by mouth.   Fluticasone-Salmeterol 100-50 MCG/DOSE Aepb Commonly known as:  ADVAIR Inhale 1 puff into the lungs 2 (two) times daily. Reported on 10/22/2015   hydrochlorothiazide 25 MG tablet Commonly known as:  HYDRODIURIL Take 1 tablet (25 mg total) by mouth daily.   levothyroxine 125 MCG tablet Commonly known as:  SYNTHROID, LEVOTHROID Take 125 mcg by mouth daily before breakfast.   LORazepam 1 MG tablet Commonly known as:  ATIVAN Take 1 mg by mouth.      Pt presents with elevated bp. We increased bp meds last Ov. Thyroid lab elevated.     Review of Systems     Objective:   Physical Exam  Constitutional: She is oriented to person, place, and time.  Cardiovascular: Normal rate, regular rhythm and normal heart sounds.   Pulmonary/Chest: Effort normal and breath sounds normal.   Neurological: She is alert and oriented to person, place, and time. She has normal reflexes.          Plan:  Rechecking bp today. Bp changed to 128/84. Pt needs to be careful with salt in diet. Thyroid is not elevated enough to change med dosage. Pt is overweight which is why liver enzymes could be elevated. RTC in 3 months. Labs prior tsh, t4, met c, cbc, Ua, and lipid.

## 2016-07-28 ENCOUNTER — Ambulatory Visit: Payer: PRIVATE HEALTH INSURANCE

## 2016-07-28 VITALS — Wt 188.0 lb

## 2016-07-28 DIAGNOSIS — E118 Type 2 diabetes mellitus with unspecified complications: Secondary | ICD-10-CM

## 2016-08-13 NOTE — Progress Notes (Unsigned)
Patient came to the [facility] Class at [facility]. 

## 2016-09-01 ENCOUNTER — Ambulatory Visit: Payer: PRIVATE HEALTH INSURANCE

## 2016-09-10 ENCOUNTER — Telehealth: Payer: Self-pay | Admitting: Pharmacist

## 2016-09-10 NOTE — Telephone Encounter (Signed)
Faxed refill request to [facility] for Prozac 10mg  - Take 1 capsule once daily, #120.

## 2016-09-15 ENCOUNTER — Other Ambulatory Visit: Payer: PRIVATE HEALTH INSURANCE

## 2016-09-15 DIAGNOSIS — E039 Hypothyroidism, unspecified: Secondary | ICD-10-CM

## 2016-09-15 DIAGNOSIS — E7800 Pure hypercholesterolemia, unspecified: Secondary | ICD-10-CM

## 2016-09-15 DIAGNOSIS — R7303 Prediabetes: Secondary | ICD-10-CM

## 2016-09-16 LAB — CBC WITH DIFFERENTIAL
BASOS ABS: 0.1 10*3/uL (ref 0.0–0.2)
Basos: 1 %
EOS (ABSOLUTE): 0.1 10*3/uL (ref 0.0–0.4)
EOS: 1 %
HEMATOCRIT: 42.8 % (ref 34.0–46.6)
HEMOGLOBIN: 14.1 g/dL (ref 11.1–15.9)
IMMATURE GRANS (ABS): 0 10*3/uL (ref 0.0–0.1)
IMMATURE GRANULOCYTES: 0 %
LYMPHS ABS: 2.6 10*3/uL (ref 0.7–3.1)
LYMPHS: 37 %
MCH: 29.3 pg (ref 26.6–33.0)
MCHC: 32.9 g/dL (ref 31.5–35.7)
MCV: 89 fL (ref 79–97)
MONOCYTES: 9 %
Monocytes Absolute: 0.7 10*3/uL (ref 0.1–0.9)
Neutrophils Absolute: 3.6 10*3/uL (ref 1.4–7.0)
Neutrophils: 52 %
RBC: 4.81 x10E6/uL (ref 3.77–5.28)
RDW: 13.8 % (ref 12.3–15.4)
WBC: 7 10*3/uL (ref 3.4–10.8)

## 2016-09-16 LAB — COMPREHENSIVE METABOLIC PANEL
A/G RATIO: 1.7 (ref 1.2–2.2)
ALBUMIN: 4.3 g/dL (ref 3.5–5.5)
ALT: 43 IU/L — ABNORMAL HIGH (ref 0–32)
AST: 34 IU/L (ref 0–40)
Alkaline Phosphatase: 73 IU/L (ref 39–117)
BILIRUBIN TOTAL: 0.3 mg/dL (ref 0.0–1.2)
BUN/Creatinine Ratio: 16 (ref 9–23)
BUN: 14 mg/dL (ref 6–24)
CHLORIDE: 96 mmol/L (ref 96–106)
CO2: 30 mmol/L — ABNORMAL HIGH (ref 18–29)
Calcium: 9.1 mg/dL (ref 8.7–10.2)
Creatinine, Ser: 0.88 mg/dL (ref 0.57–1.00)
GFR, EST AFRICAN AMERICAN: 87 mL/min/{1.73_m2} (ref >59)
GFR, EST NON AFRICAN AMERICAN: 75 mL/min/{1.73_m2} (ref >59)
Globulin, Total: 2.5 g/dL (ref 1.5–4.5)
Glucose: 107 mg/dL — ABNORMAL HIGH (ref 65–99)
POTASSIUM: 4.2 mmol/L (ref 3.5–5.2)
Sodium: 138 mmol/L (ref 134–144)
TOTAL PROTEIN: 6.8 g/dL (ref 6.0–8.5)

## 2016-09-16 LAB — URINALYSIS
BILIRUBIN UA: NEGATIVE
Glucose, UA: NEGATIVE
Ketones, UA: NEGATIVE
NITRITE UA: NEGATIVE
PH UA: 7 (ref 5.0–7.5)
PROTEIN UA: NEGATIVE
SPEC GRAV UA: 1.014 (ref 1.005–1.030)
UUROB: 0.2 mg/dL (ref 0.2–1.0)

## 2016-09-16 LAB — TSH: TSH: 13.4 u[IU]/mL — ABNORMAL HIGH (ref 0.450–4.500)

## 2016-09-16 LAB — LIPID PANEL
CHOLESTEROL TOTAL: 191 mg/dL (ref 100–199)
Chol/HDL Ratio: 4.4 ratio units (ref 0.0–4.4)
HDL: 43 mg/dL (ref >39)
LDL CALC: 99 mg/dL (ref 0–99)
Triglycerides: 246 mg/dL — ABNORMAL HIGH (ref 0–149)
VLDL CHOLESTEROL CAL: 49 mg/dL — AB (ref 5–40)

## 2016-09-16 LAB — HEMOGLOBIN A1C
Est. average glucose Bld gHb Est-mCnc: 128 mg/dL
HEMOGLOBIN A1C: 6.1 % — AB (ref 4.8–5.6)

## 2016-09-16 LAB — T4: T4, Total: 7.6 ug/dL (ref 4.5–12.0)

## 2016-09-22 ENCOUNTER — Ambulatory Visit: Payer: PRIVATE HEALTH INSURANCE | Admitting: Internal Medicine

## 2016-09-29 ENCOUNTER — Other Ambulatory Visit: Payer: Self-pay | Admitting: Internal Medicine

## 2016-10-06 ENCOUNTER — Encounter: Payer: Self-pay | Admitting: Internal Medicine

## 2016-10-06 ENCOUNTER — Ambulatory Visit: Payer: PRIVATE HEALTH INSURANCE | Admitting: Internal Medicine

## 2016-10-06 VITALS — BP 169/101 | HR 72 | Temp 98.0°F | Wt 190.0 lb

## 2016-10-06 DIAGNOSIS — E119 Type 2 diabetes mellitus without complications: Secondary | ICD-10-CM

## 2016-10-06 DIAGNOSIS — I10 Essential (primary) hypertension: Secondary | ICD-10-CM

## 2016-10-06 LAB — GLUCOSE, POCT (MANUAL RESULT ENTRY): POC Glucose: 133 mg/dl — AB (ref 70–99)

## 2016-10-06 MED ORDER — AMLODIPINE BESYLATE 5 MG PO TABS: 10.0000 mg | ORAL_TABLET | Freq: Every day | ORAL | 3 refills | Status: DC

## 2016-10-06 MED ORDER — LEVOTHYROXINE SODIUM 175 MCG PO TABS: 175.0000 ug | ORAL_TABLET | Freq: Every day | ORAL | 3 refills | Status: DC

## 2016-10-06 NOTE — Patient Instructions (Signed)
F/u in 4 mo w/ labs

## 2016-10-06 NOTE — Progress Notes (Signed)
   Subjective:    Patient ID: Lindsey Benjamin, female    DOB: 04/08/1966, 51 y.o.   MRN: [id number]  HPI   Pt presents for 3 mo f/u  Pt complains of hip pain and osteoarthritis.  Pt reports stopped taking HCTZ b/c of excessive urinating.  Pt reports CPAP machine hasn't been working and so she hasn't been using it at night.   Patient Active Problem List   Diagnosis Date Noted  . Prediabetes 10/22/2015  . Essential hypertension 07/23/2015  . Sleep apnea 07/23/2015  . Hypothyroidism 07/23/2015   Allergies as of 10/06/2016      Reactions   Penicillins Anaphylaxis   Ceclor [cefaclor] Hives   Chamomile Rash   Levaquin [levofloxacin In D5w] Rash   Peel her skin   Tomato Rash      Medication List       Accurate as of 10/06/16  9:49 AM. Always use your most recent med list.          acetaminophen 500 MG tablet Commonly known as:  TYLENOL Take 500 mg by mouth every 6 (six) hours as needed.   albuterol (2.5 MG/3ML) 0.083% nebulizer solution Commonly known as:  PROVENTIL Take 2.5 mg by nebulization every 6 (six) hours as needed for wheezing or shortness of breath.   amLODipine 5 MG tablet Commonly known as:  NORVASC Take 2 tablets (10 mg total) by mouth daily.   cetirizine 5 MG tablet Commonly known as:  ZYRTEC Take 10 mg by mouth as needed for allergies. Reported on 10/22/2015   FLUoxetine 10 MG capsule Commonly known as:  PROZAC Take 10 mg by mouth.   Fluticasone-Salmeterol 100-50 MCG/DOSE Aepb Commonly known as:  ADVAIR Inhale 1 puff into the lungs 2 (two) times daily. Reported on 10/22/2015   levothyroxine 125 MCG tablet Commonly known as:  SYNTHROID, LEVOTHROID TAKE 1 TABLET BY MOUTH EVERY DAY   LORazepam 1 MG tablet Commonly known as:  ATIVAN Take 1 mg by mouth.        Review of Systems     Objective:   Physical Exam  Constitutional: She is oriented to person, place, and time.  Cardiovascular: Normal rate, regular rhythm and normal heart sounds.    Pulmonary/Chest: Effort normal and breath sounds normal.  Neurological: She is alert and oriented to person, place, and time.    BP (!) 169/101   Pulse 72   Temp 98 F (36.7 C) (Oral)   Wt 190 lb (86.2 kg)   BMI 41.12 kg/m   Rechecked BP at 158/80. BP borderline elevated Off her diuretic b/c aggrevates her bladder incontinence. Would like to restart HCTZ 3 times/week, M, W, F.      Assessment & Plan:   F/u in 4 mo w/ labs: CBC, Met C, TSH

## 2016-12-16 ENCOUNTER — Telehealth: Payer: Self-pay | Admitting: Pharmacy Technician

## 2016-12-16 NOTE — Telephone Encounter (Signed)
Patient eligible to receive medication assistance at [facility] until 09/02/17 as long as eligibility requirements continue to be met.   J.  Care Manager [facility] 

## 2016-12-24 ENCOUNTER — Telehealth: Payer: Self-pay | Admitting: Pharmacist

## 2016-12-24 NOTE — Telephone Encounter (Signed)
12/24/16 Faxed Refill request to [facility] for Prozac 10mg  (Take 1 capsule by mouth daily).Forde RadonAJ

## 2016-12-29 ENCOUNTER — Telehealth: Payer: Self-pay | Admitting: Pharmacy Technician

## 2016-12-29 NOTE — Telephone Encounter (Signed)
Patient eligible to receive medication assistance at [facility] until 08/05/17 as long as eligibility requirements continue to be met.   J.  Care Manager [facility] 

## 2017-01-11 ENCOUNTER — Ambulatory Visit: Payer: PRIVATE HEALTH INSURANCE | Admitting: Adult Health Nurse Practitioner

## 2017-01-11 VITALS — BP 187/102 | HR 72 | Temp 98.2°F | Wt 186.3 lb

## 2017-01-11 DIAGNOSIS — I10 Essential (primary) hypertension: Secondary | ICD-10-CM

## 2017-01-11 DIAGNOSIS — J329 Chronic sinusitis, unspecified: Secondary | ICD-10-CM | POA: Insufficient documentation

## 2017-01-11 DIAGNOSIS — J0110 Acute frontal sinusitis, unspecified: Secondary | ICD-10-CM

## 2017-01-11 NOTE — Progress Notes (Signed)
Patient: Lindsey Benjamin Female    DOB: Sep 18, 1965   50 y.o.   MRN: [id number] Visit Date: 01/11/2017  Today's Provider: [facility]   Chief Complaint  Patient presents with  . Ear Drainage    right ear   Subjective:    HPI   Last visit BP was 169/101.  HCTZ was restarted taking- pt states that she is not taking due to wearing CPAP and she states it "regulates her BP".  Fu next month with labs.   Pt states that she has had pale yellow frothy drainage from the r ear with a slight odor.  Started a month ago, denies pain- states its just aggravating.  Denies fever, chills, N/V/D.  Does take zyrtec PRN- not taking recently.  Increased sneezing.  Lots of sinus drainage.    Allergies  Allergen Reactions  . Penicillins Anaphylaxis  . Ceclor [Cefaclor] Hives  . Levaquin [Levofloxacin In D5w] Rash    Peel her skin  . Tomato Rash   Previous Medications   ACETAMINOPHEN (TYLENOL) 500 MG TABLET    Take 500 mg by mouth every 6 (six) hours as needed.   ALBUTEROL (PROVENTIL) (2.5 MG/3ML) 0.083% NEBULIZER SOLUTION    Take 2.5 mg by nebulization every 6 (six) hours as needed for wheezing or shortness of breath.   AMLODIPINE (NORVASC) 5 MG TABLET    Take 2 tablets (10 mg total) by mouth daily.   CETIRIZINE (ZYRTEC) 5 MG TABLET    Take 10 mg by mouth as needed for allergies. Reported on 10/22/2015   FLUOXETINE (PROZAC) 10 MG CAPSULE    Take 10 mg by mouth.   FLUTICASONE-SALMETEROL (ADVAIR) 100-50 MCG/DOSE AEPB    Inhale 1 puff into the lungs 2 (two) times daily. Reported on 10/22/2015   LEVOTHYROXINE (SYNTHROID, LEVOTHROID) 175 MCG TABLET    Take 1 tablet (175 mcg total) by mouth daily before breakfast.   LORAZEPAM (ATIVAN) 1 MG TABLET    Take 1 mg by mouth.    Review of Systems  All other systems reviewed and are negative.   Social History  Substance Use Topics  . Smoking status: Current Every Day Smoker    Packs/day: 0.50    Years: 20.00    Types: Cigarettes  .  Smokeless tobacco: Never Used  . Alcohol use 0.0 - 0.6 oz/week     Comment: occasionally   Objective:   BP (!) 187/102   Pulse 72   Temp 98.2 F (36.8 C)   Wt 186 lb 4.8 oz (84.5 kg)   BMI 40.31 kg/m   Physical Exam  Constitutional: She appears well-developed and well-nourished.  HENT:  Head: Normocephalic and atraumatic.  Right Ear: Hearing, tympanic membrane, external ear and ear canal normal. No drainage.  Left Ear: Hearing, tympanic membrane, external ear and ear canal normal. No drainage.  Nose: Right sinus exhibits frontal sinus tenderness. Right sinus exhibits no maxillary sinus tenderness. Left sinus exhibits frontal sinus tenderness. Left sinus exhibits no maxillary sinus tenderness.  Eyes: Pupils are equal, round, and reactive to light.  Cardiovascular: Normal rate, regular rhythm and normal heart sounds.   Pulmonary/Chest: Effort normal and breath sounds normal.  Lymphadenopathy:    She has no cervical adenopathy.  Vitals reviewed.       Assessment & Plan:         HTN:   Not controlled.  Goal BP <140/90.  Restart HCTZ daily.  Encourage low salt diet and exercise.  FU as directed.  Sinusitis: Take Z-pack as indicated. Zyrtec given x 14 days.  Supportive care.    [facility]   [facility]

## 2017-01-13 ENCOUNTER — Telehealth: Payer: Self-pay

## 2017-01-13 NOTE — Telephone Encounter (Signed)
Received PAP application from [facility] for Ventolin and Advair placed for provider to sign. 

## 2017-01-18 IMAGING — CR DG CHEST 2V
1 series · 2 of 2 positions shown · non-contrast
Comparison: February 02, 2009

CLINICAL DATA: Cough and fever with headache and dizziness for 2
weeks

EXAM:
CHEST  2 VIEW

[Series 1: dg chest 2 view · 0.14mm/px · 2 of 2 slices shown]
[im 1/2]
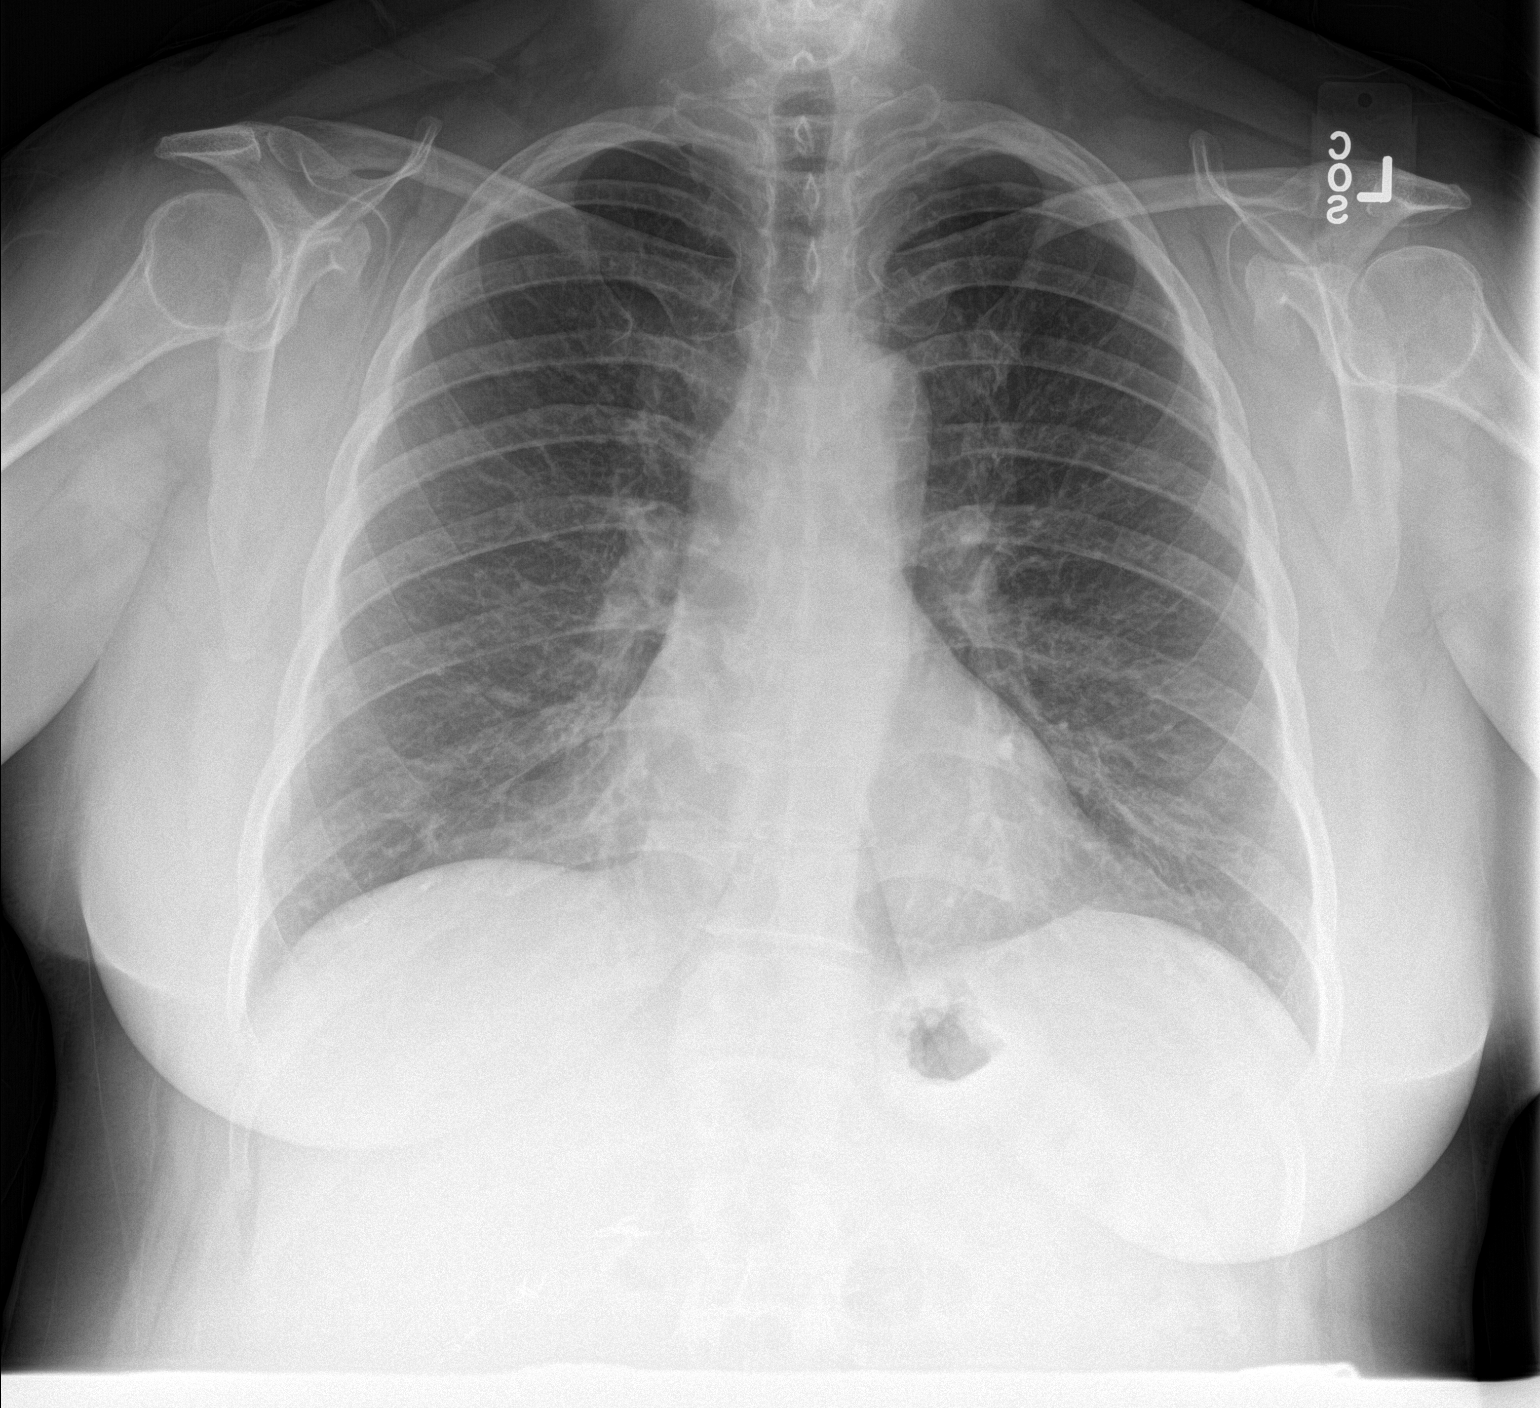
[im 2/2]
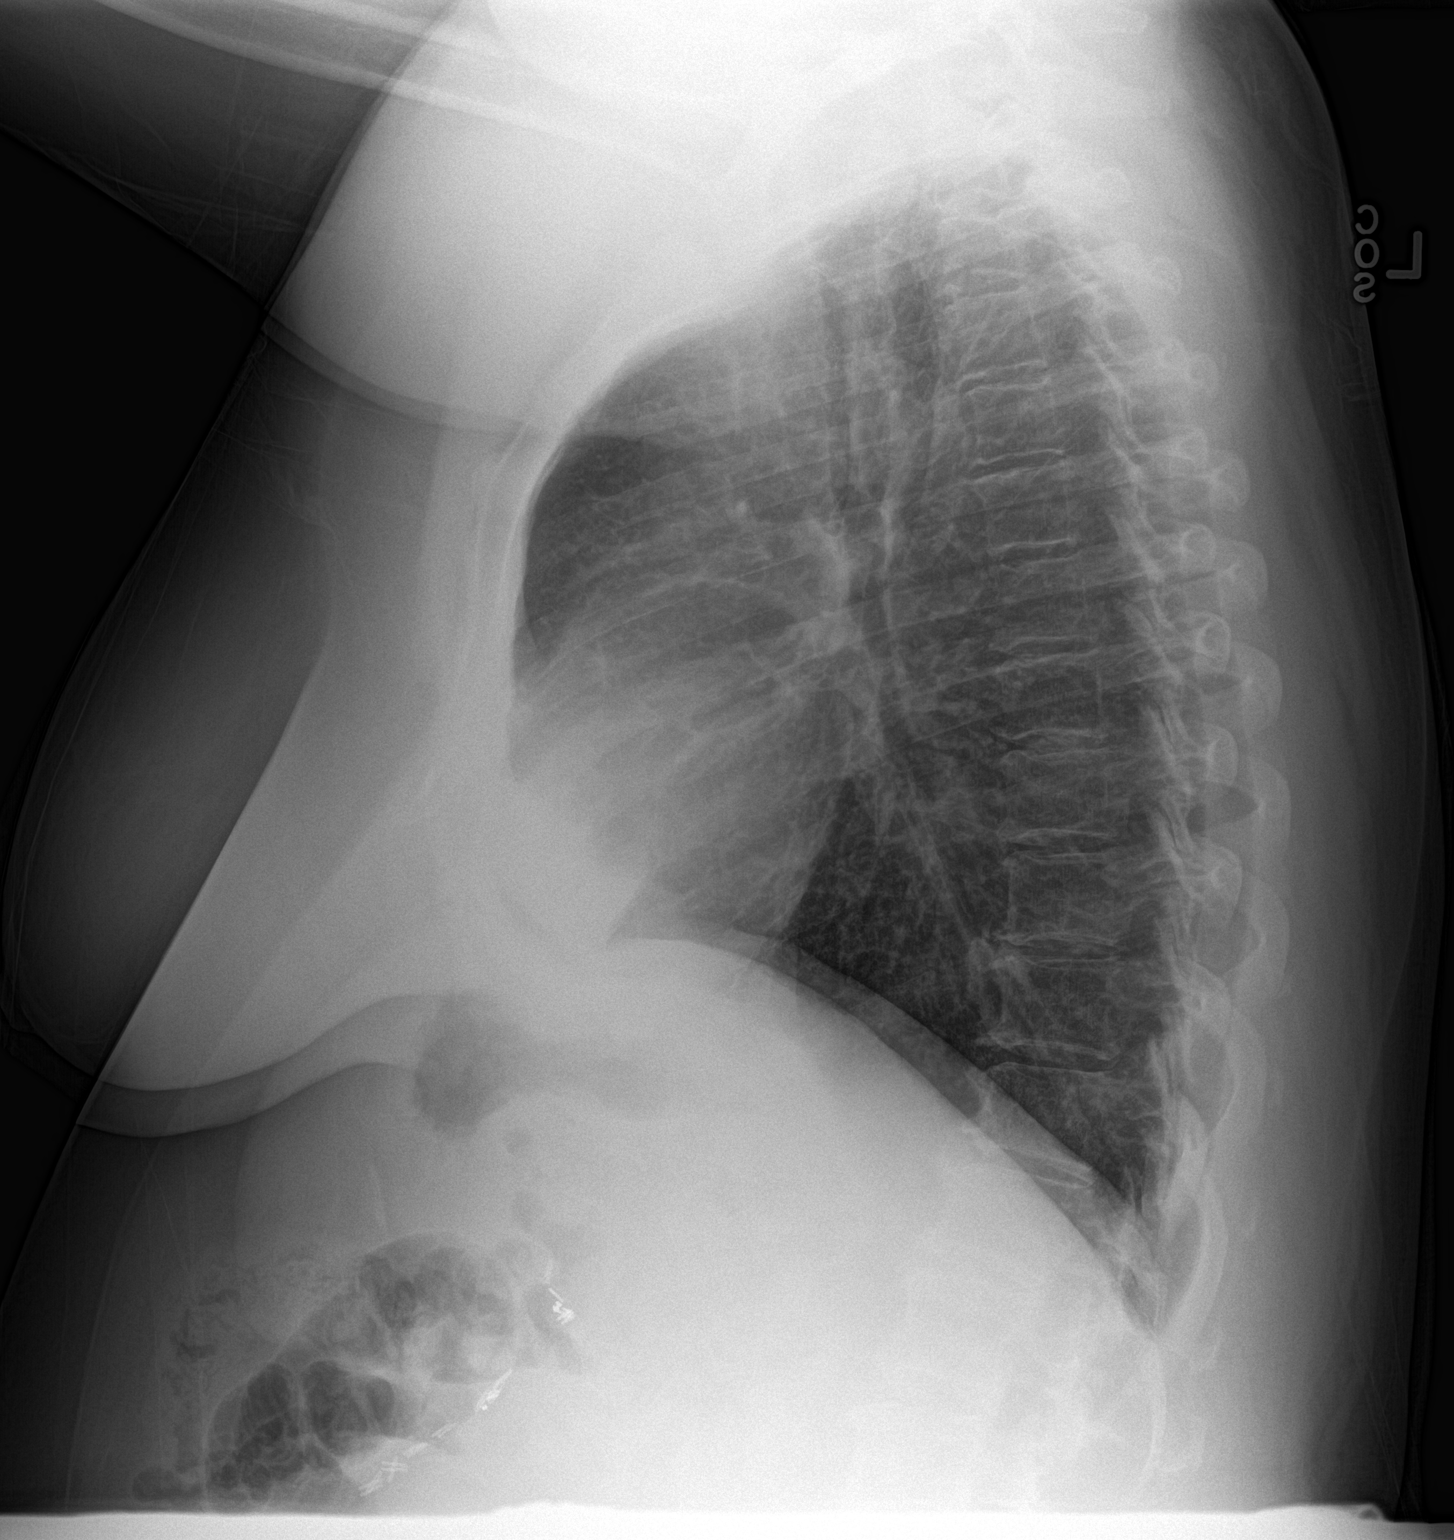

[2 of 2 positions shown; findings below may reference images not displayed]

FINDINGS: Lungs are clear. The heart size and pulmonary vascularity are
normal. No adenopathy. No bone lesions. There are surgical clips in
the upper abdomen.
IMPRESSION: No edema or consolidation.

## 2017-01-18 NOTE — Telephone Encounter (Signed)
Placed signed application/script in [facility] folder for pickup. 

## 2017-01-26 ENCOUNTER — Other Ambulatory Visit: Payer: PRIVATE HEALTH INSURANCE

## 2017-01-26 DIAGNOSIS — I10 Essential (primary) hypertension: Secondary | ICD-10-CM

## 2017-01-27 ENCOUNTER — Telehealth: Payer: Self-pay | Admitting: Pharmacist

## 2017-01-27 LAB — COMPREHENSIVE METABOLIC PANEL
A/G RATIO: 1.6 (ref 1.2–2.2)
ALT: 92 IU/L — ABNORMAL HIGH (ref 0–32)
AST: 68 IU/L — AB (ref 0–40)
Albumin: 4.1 g/dL (ref 3.5–5.5)
Alkaline Phosphatase: 78 IU/L (ref 39–117)
BUN / CREAT RATIO: 15 (ref 9–23)
BUN: 11 mg/dL (ref 6–24)
Bilirubin Total: 0.4 mg/dL (ref 0.0–1.2)
CALCIUM: 8.8 mg/dL (ref 8.7–10.2)
CO2: 25 mmol/L (ref 20–29)
Chloride: 99 mmol/L (ref 96–106)
Creatinine, Ser: 0.75 mg/dL (ref 0.57–1.00)
GFR, EST AFRICAN AMERICAN: 105 mL/min/{1.73_m2} (ref >59)
GFR, EST NON AFRICAN AMERICAN: 91 mL/min/{1.73_m2} (ref >59)
GLOBULIN, TOTAL: 2.6 g/dL (ref 1.5–4.5)
Glucose: 146 mg/dL — ABNORMAL HIGH (ref 65–99)
POTASSIUM: 4.1 mmol/L (ref 3.5–5.2)
SODIUM: 139 mmol/L (ref 134–144)
Total Protein: 6.7 g/dL (ref 6.0–8.5)

## 2017-01-27 LAB — CBC
HEMATOCRIT: 41.8 % (ref 34.0–46.6)
Hemoglobin: 13.5 g/dL (ref 11.1–15.9)
MCH: 27.7 pg (ref 26.6–33.0)
MCHC: 32.3 g/dL (ref 31.5–35.7)
MCV: 86 fL (ref 79–97)
Platelets: 197 10*3/uL (ref 150–379)
RBC: 4.87 x10E6/uL (ref 3.77–5.28)
RDW: 14 % (ref 12.3–15.4)
WBC: 6 10*3/uL (ref 3.4–10.8)

## 2017-01-27 LAB — TSH: TSH: 0.631 u[IU]/mL (ref 0.450–4.500)

## 2017-01-27 NOTE — Telephone Encounter (Signed)
01/27/17 Faxed [facility] Re Enrollment application with patient's signature page and scripts to complete Re Enrollment for Advair 100/50 Inhale 1 puff every 12 hours & Ventolin HFA Inhale 2 puffs four times a day.Forde RadonAJ

## 2017-02-02 ENCOUNTER — Ambulatory Visit: Payer: PRIVATE HEALTH INSURANCE | Admitting: Internal Medicine

## 2017-02-10 ENCOUNTER — Ambulatory Visit: Payer: PRIVATE HEALTH INSURANCE | Admitting: Ophthalmology

## 2017-02-23 ENCOUNTER — Ambulatory Visit: Payer: PRIVATE HEALTH INSURANCE | Admitting: Internal Medicine

## 2017-02-23 VITALS — BP 178/101 | HR 74 | Temp 98.4°F | Wt 188.7 lb

## 2017-02-23 DIAGNOSIS — I10 Essential (primary) hypertension: Secondary | ICD-10-CM

## 2017-02-23 DIAGNOSIS — H9211 Otorrhea, right ear: Secondary | ICD-10-CM

## 2017-02-23 DIAGNOSIS — R7303 Prediabetes: Secondary | ICD-10-CM

## 2017-02-23 LAB — GLUCOSE, POCT (MANUAL RESULT ENTRY): POC Glucose: 121 mg/dl — AB (ref 70–99)

## 2017-02-23 MED ORDER — SULFAMETHOXAZOLE-TRIMETHOPRIM 800-160 MG PO TABS: 1.0000 | ORAL_TABLET | Freq: Two times a day (BID) | ORAL | 0 refills | Status: AC

## 2017-02-23 MED ORDER — LOSARTAN POTASSIUM 50 MG PO TABS: 50.0000 mg | ORAL_TABLET | Freq: Every day | ORAL | 4 refills | Status: DC

## 2017-02-23 NOTE — Progress Notes (Signed)
   Subjective:    Patient ID: Lindsey Benjamin, female    DOB: 06/20/1966, 51 y.o.   MRN: [id number]   HPI  Pt has been having problems with right ear drainage. She was put on antibiotics, but the drainage has come back. Pt describes drainage as a dark color with a bad odor. Pt previously ruptured her right ear drum.   Pt is experiencing pain in her right shoulder.     Ear Drainage     Patient Active Problem List   Diagnosis Date Noted  . Sinusitis 01/11/2017  . Prediabetes 10/22/2015  . Essential hypertension 07/23/2015  . Sleep apnea 07/23/2015  . Hypothyroidism 07/23/2015    Allergies as of 02/23/2017      Reactions   Penicillins Anaphylaxis   Ceclor [cefaclor] Hives   Levaquin [levofloxacin In D5w] Rash   Peel her skin   Tomato Rash      Medication List       Accurate as of 02/23/17  9:23 AM. Always use your most recent med list.          acetaminophen 500 MG tablet Commonly known as:  TYLENOL Take 500 mg by mouth every 6 (six) hours as needed.   albuterol (2.5 MG/3ML) 0.083% nebulizer solution Commonly known as:  PROVENTIL Take 2.5 mg by nebulization every 6 (six) hours as needed for wheezing or shortness of breath.   amLODipine 5 MG tablet Commonly known as:  NORVASC Take 2 tablets (10 mg total) by mouth daily.   cetirizine 5 MG tablet Commonly known as:  ZYRTEC Take 10 mg by mouth as needed for allergies. Reported on 10/22/2015   FLUoxetine 10 MG capsule Commonly known as:  PROZAC Take 10 mg by mouth.   Fluticasone-Salmeterol 100-50 MCG/DOSE Aepb Commonly known as:  ADVAIR Inhale 1 puff into the lungs 2 (two) times daily. Reported on 10/22/2015   levothyroxine 175 MCG tablet Commonly known as:  SYNTHROID, LEVOTHROID Take 1 tablet (175 mcg total) by mouth daily before breakfast.   LORazepam 1 MG tablet Commonly known as:  ATIVAN Take 1 mg by mouth.            Discharge Care Instructions        Start     Ordered   02/23/17 0000  POCT  Glucose (CBG)     02/23/17 0918       Review of Systems     Objective:   Physical Exam  Constitutional: She is oriented to person, place, and time.  Cardiovascular: Normal rate, regular rhythm and normal heart sounds.   Pulmonary/Chest: Effort normal and breath sounds normal.  Neurological: She is alert and oriented to person, place, and time.   Right tempratic membrane has a lot of scar tissue. Clear drainage in right ear.   BP (!) 178/101 (BP Location: Left Arm, Patient Position: Sitting, Cuff Size: Normal)   Pulse 74   Temp 98.4 F (36.9 C)   Wt 188 lb 11.2 oz (85.6 kg)   BMI 40.83 kg/m    Manual BP recorded during encounter: 160/96     Assessment & Plan:     F/U in 4 weeks for BP. No labs needed for F/U visit.  Start losartan 50 mg once daily; Pt is intolerant of lisinopril and HCTZ.  Start bactrim ds, septra ds for ear drainage

## 2017-03-10 ENCOUNTER — Ambulatory Visit: Payer: PRIVATE HEALTH INSURANCE | Admitting: Ophthalmology

## 2017-03-16 ENCOUNTER — Telehealth: Payer: Self-pay | Admitting: Pharmacist

## 2017-03-16 NOTE — Telephone Encounter (Signed)
03/16/17 Faxed [employer] re enrollment application for Prozac 10mg  Take once capsule by mouth daily, # 120.Forde RadonAJ

## 2017-03-29 ENCOUNTER — Telehealth: Payer: Self-pay | Admitting: Pharmacist

## 2017-03-29 NOTE — Telephone Encounter (Signed)
03/29/17 Placed refill online with [facility] for Advair & Ventolin, to release 05/02/17, order# [id number].Forde Radon

## 2017-04-13 ENCOUNTER — Ambulatory Visit: Payer: PRIVATE HEALTH INSURANCE | Admitting: Internal Medicine

## 2017-04-13 ENCOUNTER — Encounter: Payer: Self-pay | Admitting: Internal Medicine

## 2017-04-13 VITALS — BP 157/97 | HR 77 | Temp 98.1°F | Wt 193.1 lb

## 2017-04-13 DIAGNOSIS — I10 Essential (primary) hypertension: Secondary | ICD-10-CM

## 2017-04-13 DIAGNOSIS — E119 Type 2 diabetes mellitus without complications: Secondary | ICD-10-CM

## 2017-04-13 LAB — POCT GLUCOSE (DEVICE FOR HOME USE): Glucose Fasting, POC: 107 mg/dL — AB (ref 70–99)

## 2017-04-13 MED ORDER — LOSARTAN POTASSIUM 50 MG PO TABS: 100.0000 mg | ORAL_TABLET | Freq: Every day | ORAL | 3 refills | Status: DC

## 2017-04-13 NOTE — Progress Notes (Signed)
   Subjective:    Patient ID: Lindsey Benjamin, female    DOB: May 19, 1966, 51 y.o.   MRN: [id number]  HPI  Pt reports no issues with BP medication (losartan ), but she has diarrhea from the antibiotics.  Pt reports that her right ear is painful and is still draining.   Patient Active Problem List   Diagnosis Date Noted  . Sinusitis 01/11/2017  . Prediabetes 10/22/2015  . Essential hypertension 07/23/2015  . Sleep apnea 07/23/2015  . Hypothyroidism 07/23/2015   Allergies as of 04/13/2017      Reactions   Penicillins Anaphylaxis   Lisinopril Cough   Ceclor [cefaclor] Hives   Levaquin [levofloxacin In D5w] Rash   Peel her skin   Tomato Rash      Medication List       Accurate as of 04/13/17  9:46 AM. Always use your most recent med list.          acetaminophen 500 MG tablet Commonly known as:  TYLENOL Take 500 mg by mouth every 6 (six) hours as needed.   albuterol (2.5 MG/3ML) 0.083% nebulizer solution Commonly known as:  PROVENTIL Take 2.5 mg by nebulization every 6 (six) hours as needed for wheezing or shortness of breath.   amLODipine 5 MG tablet Commonly known as:  NORVASC Take 2 tablets (10 mg total) by mouth daily.   cetirizine 5 MG tablet Commonly known as:  ZYRTEC Take 10 mg by mouth as needed for allergies. Reported on 10/22/2015   FLUoxetine 10 MG capsule Commonly known as:  PROZAC Take 10 mg by mouth.   Fluticasone-Salmeterol 100-50 MCG/DOSE Aepb Commonly known as:  ADVAIR Inhale 1 puff into the lungs 2 (two) times daily. Reported on 10/22/2015   levothyroxine 175 MCG tablet Commonly known as:  SYNTHROID, LEVOTHROID Take 1 tablet (175 mcg total) by mouth daily before breakfast.   LORazepam 1 MG tablet Commonly known as:  ATIVAN Take 1 mg by mouth.   losartan 50 MG tablet Commonly known as:  COZAAR Take 1 tablet (50 mg total) by mouth daily.         Review of Systems     Objective:   Physical Exam  Constitutional: She is  oriented to person, place, and time.  Cardiovascular: Normal rate, regular rhythm and normal heart sounds.   Pulmonary/Chest: Effort normal and breath sounds normal.  Neurological: She is alert and oriented to person, place, and time.     BP (!) 157/97   Pulse 77   Temp 98.1 F (36.7 C) (Oral)   Wt 193 lb 1.6 oz (87.6 kg)   BMI 41.79 kg/m     BP taken manually in room: 150/88 (sitting)       Assessment & Plan:    Increase losartan  to  daily for BP.  Follow up in 2 months with labs a week prior.  Provide a referral for [facility] care [facility] for ENT specialist (chronic otitis of left ear)

## 2017-04-20 ENCOUNTER — Ambulatory Visit: Payer: Self-pay | Attending: Oncology | Admitting: *Deleted

## 2017-04-20 ENCOUNTER — Ambulatory Visit
Admission: RE | Admit: 2017-04-20 | Discharge: 2017-04-20 | Disposition: A | Discharge status: Home / Self Care | Payer: Self-pay | Source: Ambulatory Visit | Attending: Oncology | Admitting: Oncology

## 2017-04-20 ENCOUNTER — Encounter: Payer: Self-pay | Admitting: *Deleted

## 2017-04-20 VITALS — BP 171/113 | HR 70 | Temp 97.7°F | Ht <= 58 in | Wt 189.0 lb

## 2017-04-20 DIAGNOSIS — Z0000 Encounter for general adult medical examination without abnormal findings: Secondary | ICD-10-CM

## 2017-04-20 NOTE — Patient Instructions (Signed)
Gave patient hand-out, Women Staying Healthy, Active and Well from [facility], with education on breast health, pap smears, heart and colon health. 

## 2017-04-20 NOTE — Progress Notes (Signed)
Subjective:     Patient ID: Lindsey Benjamin, female   DOB: 03/21/1966, 51 y.o.   MRN: [id number]  HPI   Review of Systems     Objective:   Physical Exam  Pulmonary/Chest: Right breast exhibits no inverted nipple, no mass, no nipple discharge, no skin change and no tenderness. Left breast exhibits no inverted nipple, no mass, no nipple discharge, no skin change and no tenderness. Breasts are asymmetrical.  Right breast is larger than the left  Abdominal: There is no splenomegaly.    Genitourinary: No labial fusion. There is no rash, tenderness, lesion or injury on the right labia. There is no rash, tenderness, lesion or injury on the left labia. Cervix exhibits no motion tenderness, no discharge and no friability. Right adnexum displays no mass, no tenderness and no fullness. Left adnexum displays no mass, no tenderness and no fullness. No erythema, tenderness or bleeding in the vagina. No foreign body in the vagina. No signs of injury around the vagina. No vaginal discharge found.         Assessment:     51 year old White female referred to [facility] by [facility] for clinical breast exam, pap and mammogram.  Clinical breast exam unremarkable.  Taught self breast awareness.  Specimen collected for pap without difficulty.  Patient refused rectal exam.  States "I have had diarrhea for 3 days".  Blood pressure elevated at 171/113.  She is to recheck her blood pressure at [facility] or [facility], and if remains higher than 140/90 she is to follow-up with her primary care provider.  Hand out on hypertention given to patient. Patient has been screened for eligibility.  She does not have any insurance, Medicare or Medicaid.  She also meets financial eligibility.  Hand-out given on the Affordable Care Act.    Plan:     Screening mammogram ordered.

## 2017-04-26 LAB — PAP LB AND HPV HIGH-RISK
HPV, HIGH-RISK: NEGATIVE
PAP Smear Comment: 0

## 2017-05-11 ENCOUNTER — Encounter: Payer: Self-pay | Admitting: *Deleted

## 2017-05-11 NOTE — Progress Notes (Signed)
Letter mailed to inform patient of her normal mammogram and pap smear.  To follow up with next mammo in one year, and pap in 5 years. [facility] to [place].

## 2017-06-06 ENCOUNTER — Telehealth: Payer: Self-pay | Admitting: Pharmacist

## 2017-06-06 NOTE — Telephone Encounter (Signed)
06/06/17 Placed refill online with [facility] for Advair 100/50 & Ventolin HFA, to ship 07/04/2017, order# [id number].Forde RadonAJ

## 2017-07-06 ENCOUNTER — Other Ambulatory Visit: Payer: PRIVATE HEALTH INSURANCE

## 2017-07-06 DIAGNOSIS — I10 Essential (primary) hypertension: Secondary | ICD-10-CM

## 2017-07-07 LAB — COMPREHENSIVE METABOLIC PANEL
A/G RATIO: 1.7 (ref 1.2–2.2)
ALBUMIN: 4.5 g/dL (ref 3.5–5.5)
ALT: 71 IU/L — ABNORMAL HIGH (ref 0–32)
AST: 59 IU/L — ABNORMAL HIGH (ref 0–40)
Alkaline Phosphatase: 81 IU/L (ref 39–117)
BUN/Creatinine Ratio: 14 (ref 9–23)
BUN: 12 mg/dL (ref 6–24)
Bilirubin Total: 0.3 mg/dL (ref 0.0–1.2)
CALCIUM: 9 mg/dL (ref 8.7–10.2)
CO2: 22 mmol/L (ref 20–29)
CREATININE: 0.86 mg/dL (ref 0.57–1.00)
Chloride: 102 mmol/L (ref 96–106)
GFR, EST AFRICAN AMERICAN: 89 mL/min/{1.73_m2} (ref >59)
GFR, EST NON AFRICAN AMERICAN: 77 mL/min/{1.73_m2} (ref >59)
GLOBULIN, TOTAL: 2.6 g/dL (ref 1.5–4.5)
Glucose: 100 mg/dL — ABNORMAL HIGH (ref 65–99)
POTASSIUM: 4.6 mmol/L (ref 3.5–5.2)
SODIUM: 141 mmol/L (ref 134–144)
TOTAL PROTEIN: 7.1 g/dL (ref 6.0–8.5)

## 2017-07-07 LAB — CBC
HEMATOCRIT: 42.4 % (ref 34.0–46.6)
Hemoglobin: 13.9 g/dL (ref 11.1–15.9)
MCH: 28.7 pg (ref 26.6–33.0)
MCHC: 32.8 g/dL (ref 31.5–35.7)
MCV: 87 fL (ref 79–97)
Platelets: 205 10*3/uL (ref 150–379)
RBC: 4.85 x10E6/uL (ref 3.77–5.28)
RDW: 14.6 % (ref 12.3–15.4)
WBC: 5.2 10*3/uL (ref 3.4–10.8)

## 2017-07-13 ENCOUNTER — Ambulatory Visit: Payer: PRIVATE HEALTH INSURANCE | Admitting: Internal Medicine

## 2017-07-13 ENCOUNTER — Encounter: Payer: Self-pay | Admitting: Internal Medicine

## 2017-07-13 VITALS — BP 151/95 | HR 83 | Temp 98.2°F | Wt 195.2 lb

## 2017-07-13 DIAGNOSIS — I10 Essential (primary) hypertension: Secondary | ICD-10-CM

## 2017-07-13 MED ORDER — HYDROCHLOROTHIAZIDE 25 MG PO TABS: 25.0000 mg | ORAL_TABLET | Freq: Every day | ORAL | 3 refills | Status: DC

## 2017-07-13 MED ORDER — LEVOTHYROXINE SODIUM 175 MCG PO TABS: 175.0000 ug | ORAL_TABLET | Freq: Every day | ORAL | 3 refills | Status: DC

## 2017-07-13 MED ORDER — PANTOPRAZOLE SODIUM 20 MG PO TBEC: 20.0000 mg | DELAYED_RELEASE_TABLET | Freq: Every day | ORAL | Status: AC

## 2017-07-13 NOTE — Progress Notes (Signed)
   Subjective:    Patient ID: Lindsey Benjamin, female    DOB: 05/10/1966, 52 y.o.   MRN: [id number]  HPI   Pt is here for a 3 month f/u. Pt presents with symptoms related to acid reflux. She states that she gets a burning sensation in her throat after she eats. The symptoms will go away after a while.   Pt has pain her right shoulder. She previously had a rotator cuff injury and broke her right arm.  Pt continues to have high BP despite taking 100mg /daily of losartan.    Allergies as of 07/13/2017      Reactions   Penicillins Anaphylaxis   Lisinopril Cough   Ceclor [cefaclor] Hives   Levaquin [levofloxacin In D5w] Rash   Peel her skin   Tomato Rash      Medication List        Accurate as of 07/13/17 10:16 AM. Always use your most recent med list.          acetaminophen 500 MG tablet Commonly known as:  TYLENOL Take 500 mg by mouth every 6 (six) hours as needed.   albuterol (2.5 MG/3ML) 0.083% nebulizer solution Commonly known as:  PROVENTIL Take 2.5 mg by nebulization every 6 (six) hours as needed for wheezing or shortness of breath.   cetirizine 5 MG tablet Commonly known as:  ZYRTEC Take 10 mg by mouth as needed for allergies. Reported on 10/22/2015   FLUoxetine 10 MG capsule Commonly known as:  PROZAC Take 10 mg by mouth.   Fluticasone-Salmeterol 100-50 MCG/DOSE Aepb Commonly known as:  ADVAIR Inhale 1 puff into the lungs 2 (two) times daily. Reported on 10/22/2015   levothyroxine 175 MCG tablet Commonly known as:  SYNTHROID, LEVOTHROID Take 1 tablet (175 mcg total) by mouth daily before breakfast.   LORazepam 1 MG tablet Commonly known as:  ATIVAN Take 1 mg by mouth.   losartan 50 MG tablet Commonly known as:  COZAAR Take 2 tablets (100 mg total) by mouth daily.      Patient Active Problem List   Diagnosis Date Noted  . Sinusitis 01/11/2017  . Prediabetes 10/22/2015  . Essential hypertension 07/23/2015  . Sleep apnea 07/23/2015  . Hypothyroidism  07/23/2015     Review of Systems     Objective:   Physical Exam  Constitutional: She is oriented to person, place, and time.  Cardiovascular: Normal rate, regular rhythm and normal heart sounds.  Pulmonary/Chest: Effort normal and breath sounds normal.  Neurological: She is alert and oriented to person, place, and time.    BP (!) 151/95 (BP Location: Left Arm, Patient Position: Sitting, Cuff Size: Normal)   Pulse 83   Temp 98.2 F (36.8 C) (Oral)   Wt 195 lb 3.2 oz (88.5 kg)   LMP 07/18/2016 (Exact Date)   BMI 43.76 kg/m   BP taken manually in room: 156/84    Assessment & Plan:   Pt continues to have high BP despite taking 100mg /daily of losartan.Several months ago pt was switched to losartan to attempt to reduce swelling in her feet. This seems to have helped the swelling, but her BP is still not at target. Prescribed hydrochlorothiazide for BP.   F/u in 3 months with labs a week prior: CMET, CBC, Lipid panel, TSH, UA, Heptatitis A,B, C immunity status  Prescribed protonix 20 mg/daily for acid reflux symptoms.

## 2017-07-19 ENCOUNTER — Other Ambulatory Visit: Payer: Self-pay

## 2017-07-19 DIAGNOSIS — K219 Gastro-esophageal reflux disease without esophagitis: Secondary | ICD-10-CM

## 2017-07-19 MED ORDER — PANTOPRAZOLE SODIUM 20 MG PO TBEC: 20.0000 mg | DELAYED_RELEASE_TABLET | Freq: Every day | ORAL | 12 refills | Status: DC

## 2017-08-17 ENCOUNTER — Telehealth: Payer: Self-pay | Admitting: Pharmacy Technician

## 2017-08-17 NOTE — Telephone Encounter (Signed)
Patient still needs to provide 2018 tax return.  Betty J. Kluttz Care Manager [facility] 

## 2017-09-29 ENCOUNTER — Telehealth: Payer: Self-pay | Admitting: Pharmacist

## 2017-09-29 NOTE — Telephone Encounter (Signed)
09/29/2017 8:52:17 AM - Advair 100/50 & Ventolin refills  09/29/17 Placed refill with [facility] online for Advair 100/50 & Ventolin HFA, to release 10/12/17, order#[id number].Forde RadonAJ

## 2017-10-05 ENCOUNTER — Other Ambulatory Visit: Payer: PRIVATE HEALTH INSURANCE

## 2017-10-05 DIAGNOSIS — I10 Essential (primary) hypertension: Secondary | ICD-10-CM

## 2017-10-07 LAB — URINALYSIS
BILIRUBIN UA: NEGATIVE
GLUCOSE, UA: NEGATIVE
Ketones, UA: NEGATIVE
LEUKOCYTES UA: NEGATIVE
Nitrite, UA: NEGATIVE
PROTEIN UA: NEGATIVE
RBC UA: NEGATIVE
SPEC GRAV UA: 1.011 (ref 1.005–1.030)
Urobilinogen, Ur: 0.2 mg/dL (ref 0.2–1.0)
pH, UA: 7 (ref 5.0–7.5)

## 2017-10-07 LAB — HEPATITIS B CORE ANTIBODY, TOTAL: HEP B C TOTAL AB: NEGATIVE

## 2017-10-07 LAB — HEPATITIS A ANTIBODY, TOTAL: Hep A Total Ab: NEGATIVE

## 2017-10-07 LAB — HEPATITIS C ANTIBODY

## 2017-10-11 ENCOUNTER — Other Ambulatory Visit: Payer: PRIVATE HEALTH INSURANCE

## 2017-10-11 DIAGNOSIS — Z0000 Encounter for general adult medical examination without abnormal findings: Secondary | ICD-10-CM

## 2017-10-12 ENCOUNTER — Ambulatory Visit: Payer: PRIVATE HEALTH INSURANCE | Admitting: Internal Medicine

## 2017-10-12 LAB — CBC
Hematocrit: 41.2 % (ref 34.0–46.6)
Hemoglobin: 13.9 g/dL (ref 11.1–15.9)
MCH: 28.6 pg (ref 26.6–33.0)
MCHC: 33.7 g/dL (ref 31.5–35.7)
MCV: 85 fL (ref 79–97)
PLATELETS: 201 10*3/uL (ref 150–379)
RBC: 4.86 x10E6/uL (ref 3.77–5.28)
RDW: 14.6 % (ref 12.3–15.4)
WBC: 6.8 10*3/uL (ref 3.4–10.8)

## 2017-10-12 LAB — LIPID PANEL
CHOL/HDL RATIO: 4.7 ratio — AB (ref 0.0–4.4)
Cholesterol, Total: 213 mg/dL — ABNORMAL HIGH (ref 100–199)
HDL: 45 mg/dL (ref >39)
LDL CALC: 131 mg/dL — AB (ref 0–99)
TRIGLYCERIDES: 187 mg/dL — AB (ref 0–149)
VLDL Cholesterol Cal: 37 mg/dL (ref 5–40)

## 2017-10-12 LAB — COMPREHENSIVE METABOLIC PANEL
ALK PHOS: 88 IU/L (ref 39–117)
ALT: 68 IU/L — ABNORMAL HIGH (ref 0–32)
AST: 47 IU/L — AB (ref 0–40)
Albumin/Globulin Ratio: 1.5 (ref 1.2–2.2)
Albumin: 4.3 g/dL (ref 3.5–5.5)
BUN/Creatinine Ratio: 16 (ref 9–23)
BUN: 17 mg/dL (ref 6–24)
Bilirubin Total: 0.4 mg/dL (ref 0.0–1.2)
CO2: 25 mmol/L (ref 20–29)
CREATININE: 1.07 mg/dL — AB (ref 0.57–1.00)
Calcium: 9.3 mg/dL (ref 8.7–10.2)
Chloride: 95 mmol/L — ABNORMAL LOW (ref 96–106)
GFR calc Af Amer: 68 mL/min/{1.73_m2} (ref >59)
GFR calc non Af Amer: 59 mL/min/{1.73_m2} — ABNORMAL LOW (ref >59)
GLUCOSE: 108 mg/dL — AB (ref 65–99)
Globulin, Total: 2.9 g/dL (ref 1.5–4.5)
Potassium: 4 mmol/L (ref 3.5–5.2)
SODIUM: 139 mmol/L (ref 134–144)
Total Protein: 7.2 g/dL (ref 6.0–8.5)

## 2017-10-12 LAB — TSH: TSH: 1.86 u[IU]/mL (ref 0.450–4.500)

## 2017-10-19 ENCOUNTER — Ambulatory Visit: Payer: PRIVATE HEALTH INSURANCE | Admitting: Internal Medicine

## 2017-10-19 ENCOUNTER — Encounter: Payer: Self-pay | Admitting: Internal Medicine

## 2017-10-19 VITALS — BP 147/93 | HR 78 | Wt 201.7 lb

## 2017-10-19 DIAGNOSIS — I10 Essential (primary) hypertension: Secondary | ICD-10-CM

## 2017-10-19 DIAGNOSIS — K760 Fatty (change of) liver, not elsewhere classified: Secondary | ICD-10-CM

## 2017-10-19 NOTE — Progress Notes (Signed)
   Subjective:    Patient ID: Lindsey Benjamin, female    DOB: 07/03/1966, 52 y.o.   MRN: [id number]  HPI   Pt is here for a f/u on labs. No new complaints.  Allergies as of 10/19/2017      Reactions   Penicillins Anaphylaxis   Lisinopril Cough   Ceclor [cefaclor] Hives   Levaquin [levofloxacin In D5w] Rash   Peel her skin   Tomato Rash      Medication List        Accurate as of 10/19/17  9:32 AM. Always use your most recent med list.          acetaminophen 500 MG tablet Commonly known as:  TYLENOL Take 500 mg by mouth every 6 (six) hours as needed.   cetirizine 5 MG tablet Commonly known as:  ZYRTEC Take 10 mg by mouth as needed for allergies. Reported on 10/22/2015   FLUoxetine 10 MG capsule Commonly known as:  PROZAC Take 10 mg by mouth.   Fluticasone-Salmeterol 100-50 MCG/DOSE Aepb Commonly known as:  ADVAIR Inhale 1 puff into the lungs 2 (two) times daily. Reported on 10/22/2015   hydrochlorothiazide 25 MG tablet Commonly known as:  HYDRODIURIL Take 1 tablet (25 mg total) by mouth daily. Take 1/2 tablet daily.   levothyroxine 175 MCG tablet Commonly known as:  SYNTHROID, LEVOTHROID Take 1 tablet (175 mcg total) by mouth daily before breakfast.   LORazepam 1 MG tablet Commonly known as:  ATIVAN Take 1 mg by mouth.   losartan 50 MG tablet Commonly known as:  COZAAR Take 2 tablets (100 mg total) by mouth daily.   pantoprazole 20 MG tablet Commonly known as:  PROTONIX Take 1 tablet (20 mg total) by mouth daily.   VENTOLIN HFA 108 (90 Base) MCG/ACT inhaler Generic drug:  albuterol Inhale into the lungs.      Patient Active Problem List   Diagnosis Date Noted  . Sinusitis 01/11/2017  . Prediabetes 10/22/2015  . Essential hypertension 07/23/2015  . Sleep apnea 07/23/2015  . Hypothyroidism 07/23/2015     Review of Systems     Objective:   Physical Exam  Constitutional: She is oriented to person, place, and time.  Cardiovascular: Normal  rate, regular rhythm and normal heart sounds.  Pulmonary/Chest: Effort normal and breath sounds normal.  Neurological: She is alert and oriented to person, place, and time.    BP (!) 147/93   Pulse 78   Wt 201 lb 11.2 oz (91.5 kg)   BMI 45.22 kg/m         Assessment & Plan:   BP continues to be mildly elevated. Because of frequent urination, she is reluctant to increase diuretic.   Hopefully she will qualify for Medicaid within the next couple of months to establish a primary care physician.   F/u in 3 months with labs a week prior.

## 2017-12-15 ENCOUNTER — Telehealth: Payer: Self-pay | Admitting: Pharmacist

## 2017-12-15 NOTE — Telephone Encounter (Signed)
12/15/2017 12:15:35 PM - [employer] renewal  12/15/17 Receive notice from [employer] for patient to renewal, patient has signed her portion of application, printed scripts for Ventolin & Advair 100/50[facility].Forde RadonAJ

## 2017-12-23 ENCOUNTER — Telehealth: Payer: Self-pay | Admitting: Pharmacist

## 2017-12-23 NOTE — Telephone Encounter (Signed)
12/23/2017 9:30:17 AM - Ventolin & Advair to [facility]  12/23/17 Faxed [facility] renewal application with scripts for Ventolin HFA 90mcg Inhale 2 puffs four times a day, & Advair 100/50 mcg Inhale 1 puff every 12 hours. Patient ID# [id number].Forde RadonAJ

## 2018-01-11 ENCOUNTER — Other Ambulatory Visit: Payer: PRIVATE HEALTH INSURANCE

## 2018-01-11 DIAGNOSIS — E119 Type 2 diabetes mellitus without complications: Secondary | ICD-10-CM

## 2018-01-11 DIAGNOSIS — I10 Essential (primary) hypertension: Secondary | ICD-10-CM

## 2018-01-12 LAB — COMPREHENSIVE METABOLIC PANEL
A/G RATIO: 1.8 (ref 1.2–2.2)
ALBUMIN: 4.6 g/dL (ref 3.5–5.5)
ALK PHOS: 82 IU/L (ref 39–117)
ALT: 54 IU/L — ABNORMAL HIGH (ref 0–32)
AST: 43 IU/L — ABNORMAL HIGH (ref 0–40)
BILIRUBIN TOTAL: 0.4 mg/dL (ref 0.0–1.2)
BUN / CREAT RATIO: 14 (ref 9–23)
BUN: 14 mg/dL (ref 6–24)
CHLORIDE: 97 mmol/L (ref 96–106)
CO2: 24 mmol/L (ref 20–29)
CREATININE: 0.99 mg/dL (ref 0.57–1.00)
Calcium: 9.4 mg/dL (ref 8.7–10.2)
GFR calc Af Amer: 75 mL/min/{1.73_m2} (ref >59)
GFR calc non Af Amer: 65 mL/min/{1.73_m2} (ref >59)
GLOBULIN, TOTAL: 2.5 g/dL (ref 1.5–4.5)
Glucose: 112 mg/dL — ABNORMAL HIGH (ref 65–99)
POTASSIUM: 4.1 mmol/L (ref 3.5–5.2)
SODIUM: 139 mmol/L (ref 134–144)
Total Protein: 7.1 g/dL (ref 6.0–8.5)

## 2018-01-12 LAB — CBC
HEMATOCRIT: 44 % (ref 34.0–46.6)
Hemoglobin: 14.2 g/dL (ref 11.1–15.9)
MCH: 28.7 pg (ref 26.6–33.0)
MCHC: 32.3 g/dL (ref 31.5–35.7)
MCV: 89 fL (ref 79–97)
Platelets: 214 10*3/uL (ref 150–450)
RBC: 4.95 x10E6/uL (ref 3.77–5.28)
RDW: 13.8 % (ref 12.3–15.4)
WBC: 6.1 10*3/uL (ref 3.4–10.8)

## 2018-01-12 LAB — HEMOGLOBIN A1C
Est. average glucose Bld gHb Est-mCnc: 146 mg/dL
Hgb A1c MFr Bld: 6.7 % — ABNORMAL HIGH (ref 4.8–5.6)

## 2018-01-18 ENCOUNTER — Ambulatory Visit: Payer: PRIVATE HEALTH INSURANCE | Admitting: Internal Medicine

## 2018-02-01 ENCOUNTER — Ambulatory Visit: Payer: PRIVATE HEALTH INSURANCE | Admitting: Internal Medicine

## 2018-02-01 ENCOUNTER — Encounter: Payer: Self-pay | Admitting: Internal Medicine

## 2018-02-01 VITALS — BP 174/95 | Temp 98.2°F | Ht <= 58 in | Wt 203.9 lb

## 2018-02-01 DIAGNOSIS — R053 Chronic cough: Secondary | ICD-10-CM

## 2018-02-01 DIAGNOSIS — M25511 Pain in right shoulder: Secondary | ICD-10-CM

## 2018-02-01 DIAGNOSIS — R05 Cough: Secondary | ICD-10-CM

## 2018-02-01 DIAGNOSIS — I10 Essential (primary) hypertension: Secondary | ICD-10-CM

## 2018-02-01 DIAGNOSIS — E039 Hypothyroidism, unspecified: Secondary | ICD-10-CM

## 2018-02-01 MED ORDER — HYDROCHLOROTHIAZIDE 25 MG PO TABS: 25.0000 mg | ORAL_TABLET | Freq: Every day | ORAL | 3 refills | Status: DC

## 2018-02-01 NOTE — Progress Notes (Signed)
   Subjective:    Patient ID: Lindsey Benjamin, female    DOB: 07/01/1966, 52 y.o.   MRN: [id number]  HPI  Pt reports pain in her R arm and is triggered by cold temperature. Pain is worse at bed time. Pain can worsen with movement.   Pt's cough has persisted.   Pt's BP remains at nontherapeutic level.    Review of Systems   Patient Active Problem List   Diagnosis Date Noted  . Hepatic steatosis 10/19/2017  . Sinusitis 01/11/2017  . Prediabetes 10/22/2015  . Essential hypertension 07/23/2015  . Sleep apnea 07/23/2015  . Hypothyroidism 07/23/2015   Allergies as of 02/01/2018      Reactions   Penicillins Anaphylaxis   Lisinopril Cough   Ceclor [cefaclor] Hives   Levaquin [levofloxacin In D5w] Rash   Peel her skin   Tomato Rash      Medication List        Accurate as of 02/01/18 11:03 AM. Always use your most recent med list.          acetaminophen 500 MG tablet Commonly known as:  TYLENOL Take 500 mg by mouth every 6 (six) hours as needed.   cetirizine 5 MG tablet Commonly known as:  ZYRTEC Take 10 mg by mouth as needed for allergies. Reported on 10/22/2015   FLUoxetine 10 MG capsule Commonly known as:  PROZAC Take 10 mg by mouth.   Fluticasone-Salmeterol 100-50 MCG/DOSE Aepb Commonly known as:  ADVAIR Inhale 1 puff into the lungs 2 (two) times daily. Reported on 10/22/2015   hydrochlorothiazide 25 MG tablet Commonly known as:  HYDRODIURIL Take 1 tablet (25 mg total) by mouth daily. Take 1/2 tablet daily.   levothyroxine 175 MCG tablet Commonly known as:  SYNTHROID, LEVOTHROID Take 1 tablet (175 mcg total) by mouth daily before breakfast.   LORazepam 1 MG tablet Commonly known as:  ATIVAN Take 1 mg by mouth.   losartan 50 MG tablet Commonly known as:  COZAAR Take 2 tablets (100 mg total) by mouth daily.   pantoprazole 20 MG tablet Commonly known as:  PROTONIX Take 1 tablet (20 mg total) by mouth daily.   VENTOLIN HFA 108 (90 Base) MCG/ACT  inhaler Generic drug:  albuterol Inhale into the lungs.           Objective:   Physical Exam  Constitutional: She is oriented to person, place, and time.  Cardiovascular: Normal rate, regular rhythm and normal heart sounds.  Pulmonary/Chest: Effort normal and breath sounds normal.  Neurological: She is alert and oriented to person, place, and time.   Examination of R arm: normal with good ROM in shoulder  BP recorded in exam room @ 11:10 AM: 180/90  BP (!) 174/95 (BP Location: Left Arm, Patient Position: Sitting)   Temp 98.2 F (36.8 C) (Oral)   Ht 4' 9.75" (1.467 m)   Wt 203 lb 14.4 oz (92.5 kg)   BMI 42.98 kg/m        Assessment & Plan:   Ordered X-ray of R Shoulder for one month of unexplained pain and chest X-ray for chronic cough.   Increase HCTZ to 25 mg daily. She was previously taking 1/2 a tablet (12.5 mg).   Return in 3 months with labs week prior.

## 2018-02-09 ENCOUNTER — Ambulatory Visit: Payer: PRIVATE HEALTH INSURANCE | Admitting: Ophthalmology

## 2018-02-16 ENCOUNTER — Ambulatory Visit
Admission: RE | Admit: 2018-02-16 | Discharge: 2018-02-16 | Disposition: A | Discharge status: Home / Self Care | Payer: Self-pay | Source: Ambulatory Visit | Attending: Internal Medicine | Admitting: Internal Medicine

## 2018-02-16 DIAGNOSIS — R053 Chronic cough: Secondary | ICD-10-CM

## 2018-02-16 DIAGNOSIS — R05 Cough: Secondary | ICD-10-CM | POA: Insufficient documentation

## 2018-02-16 DIAGNOSIS — M25511 Pain in right shoulder: Secondary | ICD-10-CM | POA: Insufficient documentation

## 2018-02-16 DIAGNOSIS — R918 Other nonspecific abnormal finding of lung field: Secondary | ICD-10-CM | POA: Insufficient documentation

## 2018-02-16 DIAGNOSIS — M25711 Osteophyte, right shoulder: Secondary | ICD-10-CM | POA: Insufficient documentation

## 2018-02-24 ENCOUNTER — Other Ambulatory Visit: Payer: Self-pay | Admitting: Internal Medicine

## 2018-02-24 DIAGNOSIS — I10 Essential (primary) hypertension: Secondary | ICD-10-CM

## 2018-03-07 ENCOUNTER — Telehealth: Payer: Self-pay | Admitting: Pharmacist

## 2018-03-07 NOTE — Telephone Encounter (Signed)
03/07/2018 12:15:17 PM - Advair & Ventolin refills  03/07/18 placed refill online with [facility] for Ventolin HFA & Advair 100/50, to release 03/20/18, Order# [id number].Forde Radon

## 2018-03-08 ENCOUNTER — Telehealth: Payer: Self-pay | Admitting: Internal Medicine

## 2018-03-08 NOTE — Telephone Encounter (Signed)
Called pt to discuss re-scheduling two appointments in October 2019. Left message for pt to return our phone call at her earliest convenience.

## 2018-04-05 ENCOUNTER — Other Ambulatory Visit: Payer: PRIVATE HEALTH INSURANCE

## 2018-04-05 DIAGNOSIS — E039 Hypothyroidism, unspecified: Secondary | ICD-10-CM

## 2018-04-05 DIAGNOSIS — I10 Essential (primary) hypertension: Secondary | ICD-10-CM

## 2018-04-06 LAB — COMPREHENSIVE METABOLIC PANEL
A/G RATIO: 1.7 (ref 1.2–2.2)
ALT: 101 IU/L — ABNORMAL HIGH (ref 0–32)
AST: 68 IU/L — ABNORMAL HIGH (ref 0–40)
Albumin: 4.5 g/dL (ref 3.5–5.5)
Alkaline Phosphatase: 98 IU/L (ref 39–117)
BILIRUBIN TOTAL: 0.4 mg/dL (ref 0.0–1.2)
BUN/Creatinine Ratio: 15 (ref 9–23)
BUN: 15 mg/dL (ref 6–24)
CALCIUM: 9.3 mg/dL (ref 8.7–10.2)
CO2: 28 mmol/L (ref 20–29)
Chloride: 97 mmol/L (ref 96–106)
Creatinine, Ser: 0.98 mg/dL (ref 0.57–1.00)
GFR, EST AFRICAN AMERICAN: 76 mL/min/{1.73_m2} (ref >59)
GFR, EST NON AFRICAN AMERICAN: 66 mL/min/{1.73_m2} (ref >59)
GLOBULIN, TOTAL: 2.6 g/dL (ref 1.5–4.5)
Glucose: 117 mg/dL — ABNORMAL HIGH (ref 65–99)
POTASSIUM: 4.5 mmol/L (ref 3.5–5.2)
SODIUM: 140 mmol/L (ref 134–144)
TOTAL PROTEIN: 7.1 g/dL (ref 6.0–8.5)

## 2018-04-06 LAB — TSH: TSH: 2.23 u[IU]/mL (ref 0.450–4.500)

## 2018-04-06 LAB — HEMOGLOBIN A1C
Est. average glucose Bld gHb Est-mCnc: 143 mg/dL
Hgb A1c MFr Bld: 6.6 % — ABNORMAL HIGH (ref 4.8–5.6)

## 2018-04-12 ENCOUNTER — Encounter: Payer: Self-pay | Admitting: Internal Medicine

## 2018-04-12 ENCOUNTER — Ambulatory Visit: Payer: PRIVATE HEALTH INSURANCE | Admitting: Internal Medicine

## 2018-04-12 VITALS — BP 196/97 | HR 68 | Temp 98.1°F | Resp 14 | Wt 206.0 lb

## 2018-04-12 DIAGNOSIS — E039 Hypothyroidism, unspecified: Secondary | ICD-10-CM

## 2018-04-12 DIAGNOSIS — R0602 Shortness of breath: Secondary | ICD-10-CM

## 2018-04-12 DIAGNOSIS — I10 Essential (primary) hypertension: Secondary | ICD-10-CM

## 2018-04-12 NOTE — Progress Notes (Signed)
Subjective:    Patient ID: Lindsey Benjamin, female    DOB: [birth year], 52 y.o.   MRN: [id number]  HPI   Pt has a broken ear drum from an ear infection. Her tympanic membrane repair surgery is scheduled.   Pt reports that she is still smoking cigarettes and experiencing SOB.   Pt has hx of sleep apnea and using a machine while sleeping.   No chief complaint on file.  Review of Systems Patient Active Problem List   Diagnosis Date Noted  . Hepatic steatosis 10/19/2017  . Sinusitis 01/11/2017  . Prediabetes 10/22/2015  . Essential hypertension 07/23/2015  . Sleep apnea 07/23/2015  . Hypothyroidism 07/23/2015   Allergies as of 04/12/2018      Reactions   Penicillins Anaphylaxis   Lisinopril Cough   Ceclor [cefaclor] Hives   Levaquin [levofloxacin In D5w] Rash   Peel her skin   Tomato Rash      Medication List        Accurate as of 04/12/18 11:52 AM. Always use your most recent med list.          acetaminophen 500 MG tablet Commonly known as:  TYLENOL Take 500 mg by mouth every 6 (six) hours as needed.   cetirizine 5 MG tablet Commonly known as:  ZYRTEC Take 10 mg by mouth as needed for allergies. Reported on 10/22/2015   cetirizine 10 MG tablet Commonly known as:  ZYRTEC Take 1 tablet by mouth daily.   FLUoxetine 10 MG capsule Commonly known as:  PROZAC Take 10 mg by mouth.   Fluticasone-Salmeterol 100-50 MCG/DOSE Aepb Commonly known as:  ADVAIR Inhale 1 puff into the lungs 2 (two) times daily. Reported on 10/22/2015   hydrochlorothiazide 25 MG tablet Commonly known as:  HYDRODIURIL Take 1 tablet (25 mg total) by mouth daily.   levothyroxine 175 MCG tablet Commonly known as:  SYNTHROID, LEVOTHROID Take 1 tablet (175 mcg total) by mouth daily before breakfast.   LORazepam 1 MG tablet Commonly known as:  ATIVAN Take 1 mg by mouth.   losartan 50 MG tablet Commonly known as:  COZAAR TAKE TWO TABLETS BY MOUTH EVERY DAY   pantoprazole 20 MG  tablet Commonly known as:  PROTONIX Take 1 tablet (20 mg total) by mouth daily.   VENTOLIN HFA 108 (90 Base) MCG/ACT inhaler Generic drug:  albuterol Inhale into the lungs.          Objective:   Physical Exam  Constitutional: She is oriented to person, place, and time.  Cardiovascular: Normal rate, regular rhythm and normal heart sounds.  Pulmonary/Chest: Effort normal and breath sounds normal. She has no wheezes.  Neurological: She is alert and oriented to person, place, and time.  Peripheral edema present.   BP (!) 196/97 (BP Location: Right Wrist, Patient Position: Sitting)   Pulse 68   Temp 98.1 F (36.7 C)   Resp 14   Wt 206 lb (93.4 kg)   SpO2 96%   BMI 43.43 kg/m        Assessment & Plan:   Hypertension: No changes to medication until further evaluation after tympanic membrane repair surgery April 25, 2018. Despite increasing BP medications at last visit, BP remains uncontrolled and increasing. Pt asked to bring in at-home BP cuff at next appointment.   Shortness of Breath: Referral for pulmonologist at next appointment after tympanic membrane repairsurgery April 25, 2018.   Hypothyroidism: Controlled.   04/05/2018 Lab Results were reviewed during the encounter.   Return  in 8 weeks with no labs prior.

## 2018-04-12 NOTE — Progress Notes (Signed)
Wrong note, not this note.

## 2018-04-26 ENCOUNTER — Other Ambulatory Visit: Payer: PRIVATE HEALTH INSURANCE

## 2018-05-03 ENCOUNTER — Ambulatory Visit: Payer: PRIVATE HEALTH INSURANCE | Admitting: Internal Medicine

## 2018-06-14 ENCOUNTER — Ambulatory Visit: Payer: PRIVATE HEALTH INSURANCE | Admitting: Internal Medicine

## 2018-06-14 ENCOUNTER — Encounter: Payer: Self-pay | Admitting: Internal Medicine

## 2018-06-14 VITALS — BP 164/99 | HR 71 | Temp 98.1°F | Ht <= 58 in | Wt 200.5 lb

## 2018-06-14 DIAGNOSIS — I10 Essential (primary) hypertension: Secondary | ICD-10-CM

## 2018-06-14 DIAGNOSIS — K219 Gastro-esophageal reflux disease without esophagitis: Secondary | ICD-10-CM

## 2018-06-14 DIAGNOSIS — K449 Diaphragmatic hernia without obstruction or gangrene: Secondary | ICD-10-CM

## 2018-06-14 DIAGNOSIS — E039 Hypothyroidism, unspecified: Secondary | ICD-10-CM

## 2018-06-14 MED ORDER — PANTOPRAZOLE SODIUM 40 MG PO TBEC: 40.0000 mg | DELAYED_RELEASE_TABLET | Freq: Every day | ORAL | 3 refills | Status: DC

## 2018-06-14 NOTE — Progress Notes (Signed)
Subjective:    Patient ID: Lindsey Benjamin, female    DOB: 07/13/1965, 52 y.o.   MRN: [id number]  HPI   Pt is a [age] year old female who is at [facility] for a follow up appointment. Pt's skin graft, ear s  Chief Complaint  Patient presents with  . Follow-up    BP check and pt concerned about acid reflux     Review of Systems Patient Active Problem List   Diagnosis Date Noted  . Hepatic steatosis 10/19/2017  . Sinusitis 01/11/2017  . Prediabetes 10/22/2015  . Essential hypertension 07/23/2015  . Sleep apnea 07/23/2015  . Hypothyroidism 07/23/2015   Allergies as of 06/14/2018      Reactions   Penicillins Anaphylaxis   Lisinopril Cough   Ceclor [cefaclor] Hives   Levaquin [levofloxacin In D5w] Rash   Peel her skin   Tomato Rash      Medication List        Accurate as of 06/14/18 10:40 AM. Always use your most recent med list.          acetaminophen 500 MG tablet Commonly known as:  TYLENOL Take 500 mg by mouth every 6 (six) hours as needed.   cetirizine 5 MG tablet Commonly known as:  ZYRTEC Take 10 mg by mouth as needed for allergies. Reported on 10/22/2015   cetirizine 10 MG tablet Commonly known as:  ZYRTEC Take 1 tablet by mouth daily.   FLUoxetine 10 MG capsule Commonly known as:  PROZAC Take 10 mg by mouth.   Fluticasone-Salmeterol 100-50 MCG/DOSE Aepb Commonly known as:  ADVAIR Inhale 1 puff into the lungs 2 (two) times daily. Reported on 10/22/2015   hydrochlorothiazide 25 MG tablet Commonly known as:  HYDRODIURIL Take 1 tablet (25 mg total) by mouth daily.   levothyroxine 175 MCG tablet Commonly known as:  SYNTHROID, LEVOTHROID Take 1 tablet (175 mcg total) by mouth daily before breakfast.   LORazepam 1 MG tablet Commonly known as:  ATIVAN Take 1 mg by mouth.   losartan 50 MG tablet Commonly known as:  COZAAR TAKE TWO TABLETS BY MOUTH EVERY DAY   pantoprazole 20 MG tablet Commonly known as:  PROTONIX Take 1 tablet (20 mg  total) by mouth daily.   VENTOLIN HFA 108 (90 Base) MCG/ACT inhaler Generic drug:  albuterol Inhale into the lungs.          Objective:   Physical Exam  Constitutional: She is oriented to person, place, and time.  Cardiovascular: Normal rate, regular rhythm and normal heart sounds.  Pulmonary/Chest: Effort normal and breath sounds normal.  Neurological: She is alert and oriented to person, place, and time.    BP (!) 164/99   Pulse 71   Temp 98.1 F (36.7 C) (Oral)   Ht 4\' 10"  (1.473 m)   Wt 200 lb 8 oz (90.9 kg)   LMP 07/07/2016 (Within Weeks)   BMI 41.90 kg/m        Assessment & Plan:   1. Essential hypertension Pt unable to tolerate arm BP cuff and uses wrist BP cuff. Diastolic readings are still staying above 90, but pt prefers to not start any additional hypertension.  Continue to stress importance of low-sodium diet and will continue to encourage her to consider additional medication at future visits.  Check in 3 Months:  - Comprehensive metabolic panel; Future - CBC; Future - Urinalysis; Future - Lipid panel; Future  2. Hypothyroidism, unspecified type Check in 3 Months: - TSH; Future  3. Hiatal hernia with gastroesophageal reflux Pt is not experiencing much relief even when sleeping at an incline. Increase Protonix from 20 mg daily to 4o mg daily.   Start Today:  - pantoprazole (PROTONIX) 40 MG tablet; Take 1 tablet (40 mg total) by mouth daily.  Dispense: 90 tablet; Refill: 3   Follow up in 3 months with labs a week prior.

## 2018-06-23 ENCOUNTER — Telehealth: Payer: Self-pay | Admitting: Pharmacist

## 2018-06-23 NOTE — Telephone Encounter (Signed)
06/23/2018 3:55:46 PM - Anora Ellipta refill  °06/23/18 Placed refill online with [facility] for Anora Ellipta 62.5-25 mcg, to release 08/01/18 order# [id number]  ° °

## 2018-08-16 ENCOUNTER — Telehealth: Payer: Self-pay | Admitting: Pharmacy Technician

## 2018-08-16 ENCOUNTER — Ambulatory Visit: Payer: Self-pay | Attending: Oncology | Admitting: *Deleted

## 2018-08-16 ENCOUNTER — Encounter: Payer: Self-pay | Admitting: *Deleted

## 2018-08-16 ENCOUNTER — Other Ambulatory Visit: Payer: Self-pay

## 2018-08-16 ENCOUNTER — Ambulatory Visit
Admission: RE | Admit: 2018-08-16 | Discharge: 2018-08-16 | Disposition: A | Discharge status: Home / Self Care | Payer: Self-pay | Source: Ambulatory Visit | Attending: Oncology | Admitting: Oncology

## 2018-08-16 VITALS — BP 160/104 | HR 69 | Temp 96.3°F | Ht <= 58 in | Wt 201.0 lb

## 2018-08-16 DIAGNOSIS — Z0000 Encounter for general adult medical examination without abnormal findings: Secondary | ICD-10-CM

## 2018-08-16 NOTE — Progress Notes (Signed)
  Subjective:     Patient ID: Lindsey Benjamin, female   DOB: 02-11-1966, 53 y.o.   MRN: [id number]  HPI   Review of Systems     Objective:   Physical Exam Chest:     Breasts:        Right: No swelling, bleeding, inverted nipple, mass, nipple discharge, skin change or tenderness.        Left: No swelling, bleeding, inverted nipple, mass, nipple discharge, skin change or tenderness.    Lymphadenopathy:     Upper Body:     Right upper body: No supraclavicular or axillary adenopathy.     Left upper body: No supraclavicular or axillary adenopathy.        Assessment:     53 year old White female returns to [facility] for annual screening.  Clinical breast with redness noted under bilateral breast.  Patient states she sweats under her breast.  Encouraged patient to wear a good support bra and to keep the area dry.  She states she cannot afford support bras and wears a "cami".  States she usually uses deodorant under her breast for the sweating, but could not today due to getting her mammogram.  Taught self breast awareness.  Last pap on 04/20/17 was negative / negative.  Next pap due in 2023.  Patient has been screened for eligibility.  She does not have any insurance, Medicare or Medicaid.  She also meets financial eligibility.  Hand-out given on the Affordable Care Act. Risk Assessment    Risk Scores      08/16/2018   Last edited by: Scarlett Presto, RN   5-year risk: 1.1 %   Lifetime risk: 7.4 %            Plan:     Screening mammogram ordered.  Will follow-up per [facility] protocol.

## 2018-08-16 NOTE — Telephone Encounter (Signed)
Received 2020 proof of income.  Patient eligible to receive medication assistance at [facility] as long as eligibility requirements continue to be met.  [facility]

## 2018-08-16 NOTE — Patient Instructions (Signed)
Gave patient hand-out, Women Staying Healthy, Active and Well from [facility], with education on breast health, pap smears, heart and colon health. 

## 2018-08-17 ENCOUNTER — Encounter: Payer: Self-pay | Admitting: *Deleted

## 2018-08-17 NOTE — Progress Notes (Signed)
Letter mailed from the [facility] to inform patient of her normal mammogram results.  Patient is to follow-up with annual screening in one year.  [facility] to [facility]. 

## 2018-08-18 ENCOUNTER — Telehealth: Payer: Self-pay | Admitting: Pharmacist

## 2018-08-18 NOTE — Telephone Encounter (Signed)
08/18/2018 10:21:32 AM - Prozac refill  08/18/2018 Faxed [employer] refill request for Prozac 10mg  Take 1 capsule by mouth every day, #120.Forde Radon

## 2018-08-28 ENCOUNTER — Telehealth: Payer: Self-pay | Admitting: Pharmacist

## 2018-08-28 NOTE — Telephone Encounter (Signed)
08/28/2018 2:39:50 PM - Advair & Ventolin scripts to [facility]  08/28/2018 Printed scripts for Advair 100/50 & Ventolin HFA 90-taking to [facility] for provider to sign for refill.Forde Radon

## 2018-09-06 ENCOUNTER — Other Ambulatory Visit: Payer: PRIVATE HEALTH INSURANCE

## 2018-09-06 DIAGNOSIS — I10 Essential (primary) hypertension: Secondary | ICD-10-CM

## 2018-09-06 DIAGNOSIS — E039 Hypothyroidism, unspecified: Secondary | ICD-10-CM

## 2018-09-07 ENCOUNTER — Encounter: Payer: Self-pay | Admitting: Pharmacist

## 2018-09-07 LAB — COMPREHENSIVE METABOLIC PANEL
ALK PHOS: 104 IU/L (ref 39–117)
ALT: 63 IU/L — ABNORMAL HIGH (ref 0–32)
AST: 42 IU/L — ABNORMAL HIGH (ref 0–40)
Albumin/Globulin Ratio: 1.6 (ref 1.2–2.2)
Albumin: 4.6 g/dL (ref 3.8–4.9)
BUN/Creatinine Ratio: 15 (ref 9–23)
BUN: 16 mg/dL (ref 6–24)
Bilirubin Total: 0.4 mg/dL (ref 0.0–1.2)
CO2: 24 mmol/L (ref 20–29)
Calcium: 9.7 mg/dL (ref 8.7–10.2)
Chloride: 98 mmol/L (ref 96–106)
Creatinine, Ser: 1.06 mg/dL — ABNORMAL HIGH (ref 0.57–1.00)
GFR calc Af Amer: 68 mL/min/{1.73_m2} (ref >59)
GFR calc non Af Amer: 59 mL/min/{1.73_m2} — ABNORMAL LOW (ref >59)
Globulin, Total: 2.8 g/dL (ref 1.5–4.5)
Glucose: 108 mg/dL — ABNORMAL HIGH (ref 65–99)
Potassium: 4.9 mmol/L (ref 3.5–5.2)
Sodium: 138 mmol/L (ref 134–144)
Total Protein: 7.4 g/dL (ref 6.0–8.5)

## 2018-09-07 LAB — LIPID PANEL
CHOLESTEROL TOTAL: 224 mg/dL — AB (ref 100–199)
Chol/HDL Ratio: 5.6 ratio — ABNORMAL HIGH (ref 0.0–4.4)
HDL: 40 mg/dL (ref >39)
LDL Calculated: 145 mg/dL — ABNORMAL HIGH (ref 0–99)
Triglycerides: 193 mg/dL — ABNORMAL HIGH (ref 0–149)
VLDL Cholesterol Cal: 39 mg/dL (ref 5–40)

## 2018-09-07 LAB — CBC
Hematocrit: 42.6 % (ref 34.0–46.6)
Hemoglobin: 14.6 g/dL (ref 11.1–15.9)
MCH: 29.4 pg (ref 26.6–33.0)
MCHC: 34.3 g/dL (ref 31.5–35.7)
MCV: 86 fL (ref 79–97)
PLATELETS: 231 10*3/uL (ref 150–450)
RBC: 4.96 x10E6/uL (ref 3.77–5.28)
RDW: 13.6 % (ref 11.7–15.4)
WBC: 6.7 10*3/uL (ref 3.4–10.8)

## 2018-09-07 LAB — SPECIMEN STATUS REPORT

## 2018-09-07 LAB — TSH: TSH: 4.1 u[IU]/mL (ref 0.450–4.500)

## 2018-09-08 ENCOUNTER — Telehealth: Payer: Self-pay | Admitting: Pharmacist

## 2018-09-08 NOTE — Telephone Encounter (Signed)
09/08/2018 11:46:49 AM - Refills-Ventolin & Advair 100/50  09/08/2018 Faxed scripts to [facility] for refills on Ventolin HFA Inhale 2 puffs four times a day & Advair 100/50 Inhale 1 puff every 12 hours.Forde Radon

## 2018-09-11 LAB — URINALYSIS, ROUTINE W REFLEX MICROSCOPIC
Bilirubin, UA: NEGATIVE
Glucose, UA: NEGATIVE
Ketones, UA: NEGATIVE
Leukocytes, UA: NEGATIVE
Nitrite, UA: NEGATIVE
Protein, UA: NEGATIVE
RBC, UA: NEGATIVE
Urobilinogen, Ur: 0.2 mg/dL (ref 0.2–1.0)
pH, UA: 7 (ref 5.0–7.5)

## 2018-09-11 LAB — SPECIMEN STATUS REPORT

## 2018-09-13 ENCOUNTER — Ambulatory Visit: Payer: PRIVATE HEALTH INSURANCE | Admitting: Internal Medicine

## 2018-09-19 ENCOUNTER — Other Ambulatory Visit: Payer: Self-pay

## 2018-09-19 ENCOUNTER — Ambulatory Visit: Payer: Self-pay | Admitting: Pharmacist

## 2018-09-19 DIAGNOSIS — Z79899 Other long term (current) drug therapy: Secondary | ICD-10-CM

## 2018-09-19 NOTE — Progress Notes (Signed)
  [facility] Visit Note  Patient: Lindsey Benjamin MRN: [id number] Date of Birth: 12/04/1965 PCP: Department, [facility]   Lindsey Benjamin 53 y.o. female presents for a Medication Management visit today.  There were no vitals taken for this visit.  Patient Information   Past Medical History:  Diagnosis Date  . Asthma   . Hypertension   . Hypothyroidism      No past surgical history on file.   Family History  Problem Relation Age of Onset  . Breast cancer Neg Hx     New Diagnoses (since last visit):            Social History   Substance and Sexual Activity  Alcohol Use Yes  . Alcohol/week: 0.0 - 1.0 standard drinks   Comment: occasionally      Social History   Tobacco Use  Smoking Status Current Every Day Smoker  . Packs/day: 0.50  . Years: 20.00  . Pack years: 10.00  . Types: Cigarettes  Smokeless Tobacco Never Used      Health Maintenance  Topic Date Due  . PNEUMOCOCCAL POLYSACCHARIDE VACCINE AGE 15-64 HIGH RISK  08/13/1965  . FOOT EXAM  08/13/1973  . OPHTHALMOLOGY EXAM  08/13/1973  . HIV Screening  08/13/1978  . TETANUS/TDAP  08/13/1982  . COLONOSCOPY  08/13/2013  . HEMOGLOBIN A1C  10/05/2018  . PAP SMEAR-Modifier  04/20/2020  . MAMMOGRAM  08/16/2020  . INFLUENZA VACCINE  Completed  . Hepatitis C Screening  Completed     Assessment and Plan: 1. Hypertension (losartan, HCTZ): has BP machine 120-130/80s. States it gets high when she has pain. Is only taking HCTZ as needed due to frequent urination. Discussed that it is a daily medication and is not intended for as needed use unless directed by a physician.  2. Smoking: has tried to chantix in the past but caused severe N/V. Is not ready to quit at this time due to being around many folks in her residence smoking.  3. Hypothyroidism (levothyroxine): endorses she is taking on an empty stomach prior to eating.  This was a telephone encounter as patient overslept  this AM. She is seeing PCP tomorrow (labs done on 3/4), and has scheduled ear surgery on Monday. Patient had no further questions regarding medications.  Mauri Reading, PharmD Pharmacy Resident  09/19/2018 9:39 AM

## 2018-09-20 ENCOUNTER — Encounter: Payer: Self-pay | Admitting: Internal Medicine

## 2018-09-20 ENCOUNTER — Ambulatory Visit: Payer: PRIVATE HEALTH INSURANCE | Admitting: Internal Medicine

## 2018-09-20 ENCOUNTER — Other Ambulatory Visit: Payer: Self-pay

## 2018-09-20 VITALS — BP 197/129 | HR 75 | Ht <= 58 in | Wt 196.7 lb

## 2018-09-20 DIAGNOSIS — E039 Hypothyroidism, unspecified: Secondary | ICD-10-CM

## 2018-09-20 DIAGNOSIS — I10 Essential (primary) hypertension: Secondary | ICD-10-CM

## 2018-09-20 DIAGNOSIS — J302 Other seasonal allergic rhinitis: Secondary | ICD-10-CM

## 2018-09-20 DIAGNOSIS — R7303 Prediabetes: Secondary | ICD-10-CM

## 2018-09-20 MED ORDER — LOSARTAN POTASSIUM 100 MG PO TABS: 100.0000 mg | ORAL_TABLET | Freq: Every day | ORAL | 3 refills | Status: DC

## 2018-09-20 MED ORDER — AMLODIPINE BESYLATE 5 MG PO TABS: 5.0000 mg | ORAL_TABLET | Freq: Every day | ORAL | 1 refills | Status: DC

## 2018-09-20 MED ORDER — CETIRIZINE HCL 10 MG PO TABS: 10.0000 mg | ORAL_TABLET | Freq: Every day | ORAL | 1 refills | Status: DC

## 2018-09-20 NOTE — Progress Notes (Signed)
Subjective:    Patient ID: Lindsey Benjamin, female    DOB: Jun 11, 1966, 53 y.o.   MRN: [id number]  HPI   Patient is a 53 year old female who presents for a six month follow up. Patient reports that she is scheduled to have surgery on her ear at [facility] on Monday, 09/25/2018.    Chief Complaint  Patient presents with  . Follow-up    high blood pressure, review labs   Review of Systems Patient Active Problem List   Diagnosis Date Noted  . Hepatic steatosis 10/19/2017  . Sinusitis 01/11/2017  . Prediabetes 10/22/2015  . Essential hypertension 07/23/2015  . Sleep apnea 07/23/2015  . Hypothyroidism 07/23/2015   Allergies as of 09/20/2018      Reactions   Penicillins Anaphylaxis   Lisinopril Cough   Ceclor [cefaclor] Hives   Zantac [ranitidine Hcl]    hives   Levaquin [levofloxacin In D5w] Rash   Peel her skin   Tomato Rash      Medication List       Accurate as of September 20, 2018 10:31 AM. Always use your most recent med list.        acetaminophen 500 MG tablet Commonly known as:  TYLENOL Take 500 mg by mouth every 6 (six) hours as needed.   cetirizine 10 MG tablet Commonly known as:  ZYRTEC Take 1 tablet by mouth daily.   FLUoxetine 10 MG capsule Commonly known as:  PROZAC Take 10 mg by mouth.   Fluticasone-Salmeterol 100-50 MCG/DOSE Aepb Commonly known as:  ADVAIR Inhale 1 puff into the lungs 2 (two) times daily. Reported on 10/22/2015   hydrochlorothiazide 25 MG tablet Commonly known as:  HYDRODIURIL Take 1 tablet (25 mg total) by mouth daily.   levothyroxine 175 MCG tablet Commonly known as:  SYNTHROID, LEVOTHROID Take 1 tablet (175 mcg total) by mouth daily before breakfast.   LORazepam 1 MG tablet Commonly known as:  ATIVAN Take 1 mg by mouth.   losartan 50 MG tablet Commonly known as:  COZAAR TAKE TWO TABLETS BY MOUTH EVERY DAY   pantoprazole 40 MG tablet Commonly known as:  Protonix Take 1 tablet (40 mg total) by mouth daily.   Ventolin HFA  108 (90 Base) MCG/ACT inhaler Generic drug:  albuterol Inhale into the lungs.         Objective:   Physical Exam  BP (!) 197/129 (BP Location: Left Arm, Patient Position: Sitting)   Pulse 75   Ht 4\' 9"  (1.448 m)   Wt 196 lb 11.2 oz (89.2 kg)   SpO2 95%   BMI 42.57 kg/m     Assessment & Plan:   1. Essential hypertension Not at target.  START TODAY: - amLODipine (NORVASC) 5 MG tablet; Take 1 tablet (5 mg total) by mouth daily.  Dispense: 90 tablet; Refill: 1  Refill Today:  - losartan (COZAAR) 100 MG tablet; Take 1 tablet (100 mg total) by mouth daily.  Dispense: 90 tablet; Refill: 3  Check in 2 Months:  - Comprehensive metabolic panel; Future  2. Seasonal allergies Patient reports symptoms such as watery eyes, itchy eyes, cough, and sneezing.   Refill Today:  - cetirizine (ZYRTEC) 10 MG tablet; Take 1 tablet (10 mg total) by mouth daily.  Dispense: 90 tablet; Refill: 1  3. Hypothyroidism, unspecified type Controlled. Last TSH was 4.100 (09/06/2018)   Check in 2 Months:  - TSH; Future  4. Prediabetes Last Glucose was 108 (09/06/2018)   Check in 2  Months: - Hemoglobin A1c; Future    RTE in May with labs a week prior.

## 2018-10-24 ENCOUNTER — Other Ambulatory Visit: Payer: Self-pay

## 2018-10-24 MED ORDER — LEVOTHYROXINE SODIUM 175 MCG PO TABS: 175.0000 ug | ORAL_TABLET | Freq: Every day | ORAL | 0 refills | Status: DC

## 2018-10-24 NOTE — Progress Notes (Signed)
Sent Rx for 30 days until repeat TSH is done on 5/6 and followup appt with Dr Candelaria Stagers on 5/13.

## 2018-11-08 ENCOUNTER — Other Ambulatory Visit: Payer: PRIVATE HEALTH INSURANCE

## 2018-11-08 ENCOUNTER — Other Ambulatory Visit: Payer: Self-pay

## 2018-11-08 DIAGNOSIS — E039 Hypothyroidism, unspecified: Secondary | ICD-10-CM

## 2018-11-08 DIAGNOSIS — I10 Essential (primary) hypertension: Secondary | ICD-10-CM

## 2018-11-08 DIAGNOSIS — R7303 Prediabetes: Secondary | ICD-10-CM

## 2018-11-10 LAB — COMPREHENSIVE METABOLIC PANEL
ALT: 39 IU/L — ABNORMAL HIGH (ref 0–32)
AST: 39 IU/L (ref 0–40)
Albumin/Globulin Ratio: 1.6 (ref 1.2–2.2)
Albumin: 4.6 g/dL (ref 3.8–4.9)
Alkaline Phosphatase: 84 IU/L (ref 39–117)
BUN/Creatinine Ratio: 10 (ref 9–23)
BUN: 9 mg/dL (ref 6–24)
Bilirubin Total: 0.4 mg/dL (ref 0.0–1.2)
CO2: 19 mmol/L — ABNORMAL LOW (ref 20–29)
Calcium: 9 mg/dL (ref 8.7–10.2)
Chloride: 101 mmol/L (ref 96–106)
Creatinine, Ser: 0.86 mg/dL (ref 0.57–1.00)
GFR calc Af Amer: 88 mL/min/{1.73_m2} (ref >59)
GFR calc non Af Amer: 76 mL/min/{1.73_m2} (ref >59)
Globulin, Total: 2.8 g/dL (ref 1.5–4.5)
Glucose: 117 mg/dL — ABNORMAL HIGH (ref 65–99)
Potassium: 4.3 mmol/L (ref 3.5–5.2)
Sodium: 139 mmol/L (ref 134–144)
Total Protein: 7.4 g/dL (ref 6.0–8.5)

## 2018-11-10 LAB — HEMOGLOBIN A1C
Est. average glucose Bld gHb Est-mCnc: 134 mg/dL
Hgb A1c MFr Bld: 6.3 % — ABNORMAL HIGH (ref 4.8–5.6)

## 2018-11-10 LAB — TSH: TSH: 19.34 u[IU]/mL — ABNORMAL HIGH (ref 0.450–4.500)

## 2018-11-15 ENCOUNTER — Ambulatory Visit: Payer: PRIVATE HEALTH INSURANCE | Admitting: Internal Medicine

## 2018-11-15 DIAGNOSIS — I10 Essential (primary) hypertension: Secondary | ICD-10-CM

## 2018-11-15 DIAGNOSIS — E039 Hypothyroidism, unspecified: Secondary | ICD-10-CM

## 2018-11-15 NOTE — Progress Notes (Signed)
Subjective:    Patient ID: Lindsey Benjamin, female    DOB: 1966/01/19, 53 y.o.   MRN: [id number]  HPI  Patient is a 53 year old female who presents for a telephonic follow up. Patient states that she has not been able to take Amlodipine 5 mg because she tries to swallow it and it dissolves in her mouth inducing her to vomit. She states that she is having issues with her sleep apnea and needs to get a new PAP machine. Patient also explained that she only received a 30 day supply of Synthroid from pharmacy and she sometimes skips a dose when she's feeling good.    Review of Systems   Patient Active Problem List   Diagnosis Date Noted  . Hepatic steatosis 10/19/2017  . Sinusitis 01/11/2017  . Prediabetes 10/22/2015  . Essential hypertension 07/23/2015  . Sleep apnea 07/23/2015  . Hypothyroidism 07/23/2015   Allergies as of 11/15/2018      Reactions   Penicillins Anaphylaxis   Lisinopril Cough   Ceclor [cefaclor] Hives   Zantac [ranitidine Hcl]    hives   Levaquin [levofloxacin In D5w] Rash   Peel her skin   Tomato Rash      Medication List       Accurate as of Nov 15, 2018  9:46 AM. If you have any questions, ask your nurse or doctor.        acetaminophen 500 MG tablet Commonly known as:  TYLENOL Take 500 mg by mouth every 6 (six) hours as needed.   amLODipine 5 MG tablet Commonly known as:  NORVASC Take 1 tablet (5 mg total) by mouth daily.   cetirizine 10 MG tablet Commonly known as:  ZYRTEC Take 1 tablet (10 mg total) by mouth daily.   FLUoxetine 10 MG capsule Commonly known as:  PROZAC Take 10 mg by mouth.   Fluticasone-Salmeterol 100-50 MCG/DOSE Aepb Commonly known as:  ADVAIR Inhale 1 puff into the lungs 2 (two) times daily. Reported on 10/22/2015   hydrochlorothiazide 25 MG tablet Commonly known as:  HYDRODIURIL Take 1 tablet (25 mg total) by mouth daily. What changed:  additional instructions   levothyroxine 175 MCG tablet Commonly known as:   SYNTHROID Take 1 tablet (175 mcg total) by mouth daily before breakfast for 30 days.   LORazepam 1 MG tablet Commonly known as:  ATIVAN Take 1 mg by mouth.   losartan 100 MG tablet Commonly known as:  COZAAR Take 1 tablet (100 mg total) by mouth daily.   pantoprazole 40 MG tablet Commonly known as:  Protonix Take 1 tablet (40 mg total) by mouth daily.   Ventolin HFA 108 (90 Base) MCG/ACT inhaler Generic drug:  albuterol Inhale into the lungs.          Objective:   Physical Exam  Telephonic Visit       Assessment & Plan:   1. Essential hypertension Patient has been unable to check BP at home because her machine needs batteries. She will try to get new batteries tomorrow. She stopped taking her Amlodipine 5 mg because she states she has difficulty swallowing it. Suggested that patient try to take amlodipine with applesauce or piece of bread to help with swallowing. Check BP at next lab appointment.     2. Hypothyroidism, unspecified type TSH increased to 19.340, encouraged patient to take all doses of prescribed  Synthroid on time. Will evaluate  medication dosage after next lab check.   Recheck in 2 Weeks:  -  T4 AND TSH; Future     FU in 2 weeks for labs and BP check

## 2018-11-17 ENCOUNTER — Telehealth: Payer: Self-pay | Admitting: Pharmacist

## 2018-11-17 NOTE — Telephone Encounter (Signed)
VENTOLIN HFA RENEWAL GSK -- 11/17/2018 Faxed GSK renewal application for Ventolin HFA Inhale 2 puffs four times a day.Forde Radon

## 2018-11-29 ENCOUNTER — Other Ambulatory Visit: Payer: PRIVATE HEALTH INSURANCE

## 2018-11-30 ENCOUNTER — Telehealth: Payer: Self-pay | Admitting: Pharmacist

## 2018-11-30 NOTE — Telephone Encounter (Signed)
11/30/2018 9:17:43 AM - Advair 100/50 & Ventolin refills  11/30/2018 Placed refills online with [facility] for Advair 100/50 & Ventolin HFA, to ship 12/18/2018, order# [id number].Forde Radon

## 2018-12-13 ENCOUNTER — Other Ambulatory Visit: Payer: PRIVATE HEALTH INSURANCE

## 2018-12-13 ENCOUNTER — Other Ambulatory Visit: Payer: Self-pay

## 2018-12-20 ENCOUNTER — Other Ambulatory Visit: Payer: PRIVATE HEALTH INSURANCE

## 2019-01-10 ENCOUNTER — Other Ambulatory Visit: Payer: PRIVATE HEALTH INSURANCE

## 2019-01-10 ENCOUNTER — Other Ambulatory Visit: Payer: Self-pay

## 2019-01-10 DIAGNOSIS — E039 Hypothyroidism, unspecified: Secondary | ICD-10-CM

## 2019-01-11 LAB — T4 AND TSH
T4, Total: 13.1 ug/dL — ABNORMAL HIGH (ref 4.5–12.0)
TSH: 4.16 u[IU]/mL (ref 0.450–4.500)

## 2019-01-16 ENCOUNTER — Other Ambulatory Visit: Payer: Self-pay | Admitting: Internal Medicine

## 2019-02-15 ENCOUNTER — Ambulatory Visit: Payer: PRIVATE HEALTH INSURANCE | Admitting: Ophthalmology

## 2019-03-28 ENCOUNTER — Telehealth: Payer: Self-pay | Admitting: Pharmacist

## 2019-03-28 NOTE — Telephone Encounter (Signed)
03/28/2019 10:49:47 AM - Advair & Ventolin time for renewal  03/28/2019 Mailing [place] form to patient to sign & return to renew for Advair & Ventolin--printed scripts for these meds also for Benjamine Mola to sign tomorrow.Delos Haring

## 2019-04-04 ENCOUNTER — Other Ambulatory Visit: Payer: Self-pay

## 2019-04-04 ENCOUNTER — Ambulatory Visit: Payer: PRIVATE HEALTH INSURANCE | Admitting: Internal Medicine

## 2019-04-04 DIAGNOSIS — G4730 Sleep apnea, unspecified: Secondary | ICD-10-CM

## 2019-04-04 DIAGNOSIS — J0191 Acute recurrent sinusitis, unspecified: Secondary | ICD-10-CM

## 2019-04-04 DIAGNOSIS — E039 Hypothyroidism, unspecified: Secondary | ICD-10-CM

## 2019-04-04 DIAGNOSIS — I10 Essential (primary) hypertension: Secondary | ICD-10-CM

## 2019-04-04 MED ORDER — LEVOTHYROXINE SODIUM 175 MCG PO TABS: ORAL_TABLET | ORAL | 0 refills | Status: DC

## 2019-04-04 MED ORDER — ATENOLOL 25 MG PO TABS: 25.0000 mg | ORAL_TABLET | Freq: Every day | ORAL | 3 refills | Status: DC

## 2019-04-04 NOTE — Progress Notes (Signed)
   Subjective:    Patient ID: Lindsey Benjamin, female    DOB: 09-04-1965, 53 y.o.   MRN: [id number]  HPI  Patient is a 53 year old female presenting for a telephonic visit. Patient reports she has been tracking her blood pressure. Patient reports she has a scheduled CAT scan to view her ear drum.   Review of Systems      Patient Active Problem List   Diagnosis Date Noted  . Hepatic steatosis 10/19/2017  . Sinusitis 01/11/2017  . Prediabetes 10/22/2015  . Essential hypertension 07/23/2015  . Sleep apnea 07/23/2015  . Hypothyroidism 07/23/2015   Allergies as of 04/04/2019      Reactions   Penicillins Anaphylaxis   Lisinopril Cough   Ceclor [cefaclor] Hives   Zantac [ranitidine Hcl]    hives   Levaquin [levofloxacin In D5w] Rash   Peel her skin   Tomato Rash      Medication List       Accurate as of April 04, 2019 11:52 AM. If you have any questions, ask your nurse or doctor.        STOP taking these medications   amLODipine 5 MG tablet Commonly known as: NORVASC Stopped by: Tawni Millers, MD     TAKE these medications   acetaminophen 500 MG tablet Commonly known as: TYLENOL Take 500 mg by mouth every 6 (six) hours as needed.   atenolol 25 MG tablet Commonly known as: TENORMIN Take 1 tablet (25 mg total) by mouth daily. Started by: Tawni Millers, MD   cetirizine 10 MG tablet Commonly known as: ZYRTEC Take 1 tablet (10 mg total) by mouth daily.   FLUoxetine 10 MG capsule Commonly known as: PROZAC Take 10 mg by mouth.   Fluticasone-Salmeterol 100-50 MCG/DOSE Aepb Commonly known as: ADVAIR Inhale 1 puff into the lungs 2 (two) times daily. Reported on 10/22/2015   hydrochlorothiazide 25 MG tablet Commonly known as: HYDRODIURIL Take 1 tablet (25 mg total) by mouth daily. What changed: additional instructions   levothyroxine 175 MCG tablet Commonly known as: SYNTHROID TAKE ONE TABLET BY MOUTH EVERY DAY BEFORE BREAKFAST   LORazepam 1 MG tablet  Commonly known as: ATIVAN Take 1 mg by mouth.   losartan 100 MG tablet Commonly known as: COZAAR Take 1 tablet (100 mg total) by mouth daily.   pantoprazole 40 MG tablet Commonly known as: Protonix Take 1 tablet (40 mg total) by mouth daily.   Ventolin HFA 108 (90 Base) MCG/ACT inhaler Generic drug: albuterol Inhale into the lungs.       Objective:   Physical Exam   No PE - telephonic visit     Assessment & Plan:  1. Essential Hypertension Patient reporst she is doing well with diastolic blood pressure in the upper 80s and pulse rate in the 70s. Despite efforts we have been unable to improve the nausea associated with amolpodipine. Will replace it with atenolol 25mg  daily and evaluate in a few weeks.   2. Sinuses Ongoing evaluation due to perforated ear drums by [facility] specialist.  3. Sleep apnea Pt has received new machine. She is working on getting a new face mask.  4. Hypothyroidism Labs in July appear to be stable    Labs scheduled for next week. Has fu appt with me in January with labs a week prior  No other med changes recommended.

## 2019-04-06 ENCOUNTER — Telehealth: Payer: Self-pay | Admitting: Pharmacist

## 2019-04-06 NOTE — Telephone Encounter (Signed)
04/06/2019 10:10:09 AM - [place] renewal faxed for Ventolin & Advair  [phone] Faxed GSK application for renewal on Advair 100/50 Inhale 1 puff every 12 hours, & Ventolin HFA 35mcg Inhale 2 puffs four times a day.Delos Haring

## 2019-04-11 ENCOUNTER — Other Ambulatory Visit: Payer: Self-pay | Admitting: Gerontology

## 2019-04-11 ENCOUNTER — Other Ambulatory Visit: Payer: Self-pay

## 2019-04-11 ENCOUNTER — Other Ambulatory Visit: Payer: PRIVATE HEALTH INSURANCE

## 2019-04-11 DIAGNOSIS — Z0000 Encounter for general adult medical examination without abnormal findings: Secondary | ICD-10-CM

## 2019-04-11 DIAGNOSIS — E039 Hypothyroidism, unspecified: Secondary | ICD-10-CM

## 2019-04-11 DIAGNOSIS — I10 Essential (primary) hypertension: Secondary | ICD-10-CM

## 2019-04-11 NOTE — Progress Notes (Unsigned)
cmp

## 2019-04-12 ENCOUNTER — Ambulatory Visit: Payer: PRIVATE HEALTH INSURANCE

## 2019-04-12 LAB — COMPREHENSIVE METABOLIC PANEL
ALT: 64 IU/L — ABNORMAL HIGH (ref 0–32)
AST: 55 IU/L — ABNORMAL HIGH (ref 0–40)
Albumin/Globulin Ratio: 1.5 (ref 1.2–2.2)
Albumin: 4.2 g/dL (ref 3.8–4.9)
Alkaline Phosphatase: 97 IU/L (ref 39–117)
BUN/Creatinine Ratio: 14 (ref 9–23)
BUN: 14 mg/dL (ref 6–24)
Bilirubin Total: 0.6 mg/dL (ref 0.0–1.2)
CO2: 26 mmol/L (ref 20–29)
Calcium: 8.9 mg/dL (ref 8.7–10.2)
Chloride: 100 mmol/L (ref 96–106)
Creatinine, Ser: 1.01 mg/dL — ABNORMAL HIGH (ref 0.57–1.00)
GFR calc Af Amer: 72 mL/min/{1.73_m2} (ref >59)
GFR calc non Af Amer: 63 mL/min/{1.73_m2} (ref >59)
Globulin, Total: 2.8 g/dL (ref 1.5–4.5)
Glucose: 127 mg/dL — ABNORMAL HIGH (ref 65–99)
Potassium: 4.3 mmol/L (ref 3.5–5.2)
Sodium: 140 mmol/L (ref 134–144)
Total Protein: 7 g/dL (ref 6.0–8.5)

## 2019-04-12 LAB — HEMOGLOBIN A1C
Est. average glucose Bld gHb Est-mCnc: 143 mg/dL
Hgb A1c MFr Bld: 6.6 % — ABNORMAL HIGH (ref 4.8–5.6)

## 2019-04-12 LAB — T4: T4, Total: 14.8 ug/dL — ABNORMAL HIGH (ref 4.5–12.0)

## 2019-04-12 LAB — TSH: TSH: 2.1 u[IU]/mL (ref 0.450–4.500)

## 2019-04-18 ENCOUNTER — Ambulatory Visit: Payer: Self-pay | Admitting: Internal Medicine

## 2019-04-25 ENCOUNTER — Telehealth: Payer: Self-pay | Admitting: Pharmacist

## 2019-04-25 NOTE — Telephone Encounter (Signed)
04/25/2019 2:05:26 PM - Ventolin refill online with [facility]  04/25/2019 Placed refill online with [facility] for Ventolin, to ship 05/15/2019, order# [id number].Delos Haring

## 2019-04-30 ENCOUNTER — Encounter: Payer: Self-pay | Admitting: Podiatry

## 2019-04-30 ENCOUNTER — Ambulatory Visit (INDEPENDENT_AMBULATORY_CARE_PROVIDER_SITE_OTHER): Payer: Self-pay | Admitting: Podiatry

## 2019-04-30 ENCOUNTER — Other Ambulatory Visit: Payer: Self-pay

## 2019-04-30 DIAGNOSIS — M79674 Pain in right toe(s): Secondary | ICD-10-CM

## 2019-04-30 DIAGNOSIS — B351 Tinea unguium: Secondary | ICD-10-CM

## 2019-04-30 DIAGNOSIS — L608 Other nail disorders: Secondary | ICD-10-CM

## 2019-04-30 DIAGNOSIS — M79675 Pain in left toe(s): Secondary | ICD-10-CM

## 2019-04-30 DIAGNOSIS — Q72892 Other reduction defects of left lower limb: Secondary | ICD-10-CM | POA: Insufficient documentation

## 2019-04-30 NOTE — Progress Notes (Signed)
Complaint:  Visit Type: Patient presents  to my office for  preventative foot care services. Complaint: Patient states" my nails have grown long and thick and become painful to walk and wear shoes" .  She says she had nail surgery on both great toes at [facility].   The patient presents for preventative foot care services.  Podiatric Exam: Vascular: dorsalis pedis and posterior tibial pulses are palpable bilateral. Capillary return is immediate. Temperature gradient is WNL. Skin turgor WNL  Sensorium: Normal Semmes Weinstein monofilament test. Normal tactile sensation bilaterally. Nail Exam: Pt has thick disfigured discolored nails with subungual debris noted bilateral entire nail hallux through fifth toenails Ulcer Exam: There is no evidence of ulcer or pre-ulcerative changes or infection. Orthopedic Exam: Muscle tone and strength are WNL. No limitations in general ROM. No crepitus or effusions noted. Foot type and digits show no abnormalities. 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 Skin: No Porokeratosis. No infection or ulcers  Diagnosis:  Onychomycosis,   Pincer nails  B/L., Pain in right toe, pain in left toes  Treatment & Plan Procedures and Treatment: Consent by patient was obtained for treatment procedures.   Debridement of mycotic and hypertrophic toenails, 1 through 5 bilateral and clearing of subungual debris. No ulceration, no infection noted. Patient said dhe pulled out piece of nail right hallux this morning. Return Visit-Office Procedure: Patient instructed to return to the office for a follow up visit 3 months for continued evaluation and treatment.    Gardiner Barefoot DPM

## 2019-06-07 ENCOUNTER — Other Ambulatory Visit: Payer: Self-pay | Admitting: Internal Medicine

## 2019-07-11 ENCOUNTER — Other Ambulatory Visit: Payer: PRIVATE HEALTH INSURANCE

## 2019-07-11 ENCOUNTER — Other Ambulatory Visit: Payer: Self-pay

## 2019-07-11 DIAGNOSIS — Z0000 Encounter for general adult medical examination without abnormal findings: Secondary | ICD-10-CM

## 2019-07-12 LAB — COMPREHENSIVE METABOLIC PANEL
ALT: 57 IU/L — ABNORMAL HIGH (ref 0–32)
AST: 57 IU/L — ABNORMAL HIGH (ref 0–40)
Albumin/Globulin Ratio: 1.6 (ref 1.2–2.2)
Albumin: 4.3 g/dL (ref 3.8–4.9)
Alkaline Phosphatase: 81 IU/L (ref 39–117)
BUN/Creatinine Ratio: 16 (ref 9–23)
BUN: 15 mg/dL (ref 6–24)
Bilirubin Total: 0.4 mg/dL (ref 0.0–1.2)
CO2: 26 mmol/L (ref 20–29)
Calcium: 9.1 mg/dL (ref 8.7–10.2)
Chloride: 98 mmol/L (ref 96–106)
Creatinine, Ser: 0.96 mg/dL (ref 0.57–1.00)
GFR calc Af Amer: 77 mL/min/{1.73_m2} (ref >59)
GFR calc non Af Amer: 67 mL/min/{1.73_m2} (ref >59)
Globulin, Total: 2.7 g/dL (ref 1.5–4.5)
Glucose: 122 mg/dL — ABNORMAL HIGH (ref 65–99)
Potassium: 4.6 mmol/L (ref 3.5–5.2)
Sodium: 138 mmol/L (ref 134–144)
Total Protein: 7 g/dL (ref 6.0–8.5)

## 2019-07-12 LAB — HEMOGLOBIN A1C
Est. average glucose Bld gHb Est-mCnc: 137 mg/dL
Hgb A1c MFr Bld: 6.4 % — ABNORMAL HIGH (ref 4.8–5.6)

## 2019-07-12 LAB — TSH: TSH: 7.64 u[IU]/mL — ABNORMAL HIGH (ref 0.450–4.500)

## 2019-07-12 LAB — T4: T4, Total: 10.5 ug/dL (ref 4.5–12.0)

## 2019-07-13 ENCOUNTER — Other Ambulatory Visit: Payer: Self-pay

## 2019-07-16 ENCOUNTER — Other Ambulatory Visit: Payer: Self-pay | Admitting: *Deleted

## 2019-07-16 DIAGNOSIS — Z0000 Encounter for general adult medical examination without abnormal findings: Secondary | ICD-10-CM

## 2019-07-17 ENCOUNTER — Ambulatory Visit: Payer: Self-pay

## 2019-07-18 ENCOUNTER — Ambulatory Visit: Payer: Self-pay | Admitting: Internal Medicine

## 2019-07-30 ENCOUNTER — Ambulatory Visit: Payer: Self-pay | Admitting: Podiatry

## 2019-08-01 ENCOUNTER — Other Ambulatory Visit: Payer: Self-pay

## 2019-08-01 ENCOUNTER — Ambulatory Visit: Payer: Self-pay | Admitting: Internal Medicine

## 2019-08-01 VITALS — BP 191/97 | HR 60 | Temp 96.4°F | Ht <= 58 in | Wt 200.9 lb

## 2019-08-01 DIAGNOSIS — E039 Hypothyroidism, unspecified: Secondary | ICD-10-CM

## 2019-08-01 DIAGNOSIS — I10 Essential (primary) hypertension: Secondary | ICD-10-CM

## 2019-08-01 DIAGNOSIS — K760 Fatty (change of) liver, not elsewhere classified: Secondary | ICD-10-CM

## 2019-08-01 DIAGNOSIS — G4730 Sleep apnea, unspecified: Secondary | ICD-10-CM

## 2019-08-01 NOTE — Progress Notes (Signed)
Subjective:    Patient ID: Lindsey Benjamin, female    DOB: [birth year], 54 y.o.   MRN: [id number]  HPI  Patient is a 54 year old female presenting for a telephonic follow up of hypertension. She recently had an ear surgery. She feels that it went well. Pt has a FU with surgeon in March.   Review of Systems Patient Active Problem List   Diagnosis Date Noted  . Pain due to onychomycosis of toenails of both feet 04/30/2019  . Pincer nail deformity 04/30/2019  . 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 Hepatic steatosis 10/19/2017  . Sinusitis 01/11/2017  . Prediabetes 10/22/2015  . Essential hypertension 07/23/2015  . Sleep apnea 07/23/2015  . Hypothyroidism 07/23/2015   Allergies as of 08/01/2019      Reactions   Penicillins Anaphylaxis   Ranitidine Hives   Lisinopril Cough   Ceclor [cefaclor] Hives   Ofloxacin    Zantac [ranitidine Hcl]    hives   Levaquin [levofloxacin In D5w] Rash   Peel her skin   Levofloxacin Other (See Comments), Rash   Tomato Rash      Medication List       Accurate as of August 01, 2019 10:07 AM. If you have any questions, ask your nurse or doctor.        acetaminophen 500 MG tablet Commonly known as: TYLENOL Take 500 mg by mouth every 6 (six) hours as needed.   amLODipine 5 MG tablet Commonly known as: NORVASC Take by mouth.   atenolol 25 MG tablet Commonly known as: TENORMIN Take 1 tablet (25 mg total) by mouth daily.   cetirizine 10 MG tablet Commonly known as: ZYRTEC Take 1 tablet (10 mg total) by mouth daily.   FLUoxetine 10 MG capsule Commonly known as: PROZAC Take 10 mg by mouth.   Fluticasone-Salmeterol 100-50 MCG/DOSE Aepb Commonly known as: ADVAIR Inhale 1 puff into the lungs 2 (two) times daily. Reported on 10/22/2015   hydrochlorothiazide 25 MG tablet Commonly known as: HYDRODIURIL Take 1 tablet (25 mg total) by mouth daily. What changed: additional instructions   levothyroxine 175 MCG tablet Commonly  known as: SYNTHROID TAKE ONE TABLET BY MOUTH EVERY DAY BEFORE BREAKFAST   LORazepam 1 MG tablet Commonly known as: ATIVAN Take 1 mg by mouth.   losartan 100 MG tablet Commonly known as: COZAAR Take 1 tablet (100 mg total) by mouth daily.   pantoprazole 40 MG tablet Commonly known as: Protonix Take 1 tablet (40 mg total) by mouth daily.   Ventolin HFA 108 (90 Base) MCG/ACT inhaler Generic drug: albuterol Inhale into the lungs.          Objective:   Physical Exam  No PE      Assessment & Plan:  1. Essential hypertension Pt reports BP are somewhat improved as she does self monitoring. Her reports are not at target. Had surgery in December at [facility] and their documented BP is 152/77 post-op. Pt is contemplating sinus corrective surgery as it has been suggested by her [facility] doctors. It may help correct her sleep apnea as well as her BP. - Lipid Profile; Future - Comp Met (CMET); Future - CBC; Future - HgB A1c; Future - T4 AND TSH; Future - Gamma GT; Future  2. Sleep apnea, unspecified type Recently got a new CPAP machine but did not function satisfactory and it was returned. She is still using her old apparatus OK.  3. Hepatic steatosis Mild liver function abnormalities persist in the  labs.   4. Hypothyroidism, unspecified type Current labs suggest medication is OK. Continue working on weight reduction and continue on current medications.   RTC in 3 months with labs a week prior.

## 2019-08-24 ENCOUNTER — Telehealth: Payer: Self-pay | Admitting: Pharmacy Technician

## 2019-08-24 NOTE — Telephone Encounter (Signed)
Received 2021 proof of income.  Patient eligible to receive medication assistance at [facility] until re-certification ends in 2022, and as long as eligibility requirements continue to be met.   J.  Care Manager [facility] 

## 2019-09-19 ENCOUNTER — Encounter: Payer: Self-pay | Admitting: Internal Medicine

## 2019-09-24 ENCOUNTER — Other Ambulatory Visit: Payer: Self-pay

## 2019-09-26 ENCOUNTER — Other Ambulatory Visit: Payer: Self-pay

## 2019-09-26 DIAGNOSIS — H539 Unspecified visual disturbance: Secondary | ICD-10-CM

## 2019-09-26 NOTE — Progress Notes (Signed)
Referring patient to [facility]

## 2019-10-02 ENCOUNTER — Ambulatory Visit: Payer: Self-pay

## 2019-10-08 ENCOUNTER — Other Ambulatory Visit: Payer: Self-pay

## 2019-10-08 ENCOUNTER — Ambulatory Visit (INDEPENDENT_AMBULATORY_CARE_PROVIDER_SITE_OTHER): Payer: Self-pay | Admitting: Podiatry

## 2019-10-08 ENCOUNTER — Encounter: Payer: Self-pay | Admitting: Podiatry

## 2019-10-08 VITALS — Temp 98.1°F

## 2019-10-08 DIAGNOSIS — B351 Tinea unguium: Secondary | ICD-10-CM

## 2019-10-08 DIAGNOSIS — Q72892 Other reduction defects of left lower limb: Secondary | ICD-10-CM

## 2019-10-08 DIAGNOSIS — M79674 Pain in right toe(s): Secondary | ICD-10-CM

## 2019-10-08 DIAGNOSIS — M79675 Pain in left toe(s): Secondary | ICD-10-CM

## 2019-10-08 DIAGNOSIS — L608 Other nail disorders: Secondary | ICD-10-CM

## 2019-10-08 NOTE — Progress Notes (Signed)
This patient returns to the office for evaluation and treatment of long thick painful nails .  This patient is unable to trim his own nails since the patient cannot reach the feet.  Patient says the nails are painful walking and wearing her shoes. She returns for preventive foot care services.  General Appearance  Alert, conversant and in no acute stress.  Vascular  Dorsalis pedis and posterior tibial  pulses are palpable  bilaterally.  Capillary return is within normal limits  bilaterally. Temperature is within normal limits  bilaterally.  Neurologic  Senn-Weinstein monofilament wire test within normal limits  bilaterally. Muscle power within normal limits bilaterally.  Nails Thick disfigured discolored nails with subungual debris  from hallux to fifth toes bilaterally.  Pincer nails 1,2  B/L. No evidence of bacterial infection or drainage bilaterally.  Orthopedic  No limitations of motion  feet .  No crepitus or effusions noted.  No bony pathology or digital deformities noted. Btachymetatarsia 4th met left foot.  Skin  normotropic skin with no porokeratosis noted bilaterally.  No signs of infections or ulcers noted.     Onychomycosis  Pain in toes right foot  Pain in toes left foot  Debridement  of nails  1-5  B/L with a nail nipper.  Nails were then filed using a dremel tool with no incidents.    RTC   prn   Gardiner Barefoot DPM

## 2019-10-15 ENCOUNTER — Ambulatory Visit: Payer: Self-pay

## 2019-10-15 ENCOUNTER — Other Ambulatory Visit: Payer: Self-pay

## 2019-10-15 DIAGNOSIS — Z79899 Other long term (current) drug therapy: Secondary | ICD-10-CM

## 2019-10-15 NOTE — Progress Notes (Signed)
[facility] Visit Note  Patient: Lindsey Benjamin MRN: [id number] Date of Birth: [birth year] PCP: Virl Axe, MD   [name] 54 y.o. female presents for a telephone medication therapy management visit with the pharmacist today. Patient identity verified using two patient identifiers.  There were no vitals taken for this visit.  Patient Information   Past Medical History:  Diagnosis Date  . Asthma   . Hypertension   . Hypothyroidism       Past Surgical History:  Procedure Laterality Date  . CESAREAN SECTION    . CHOLECYSTECTOMY       Family History  Problem Relation Age of Onset  . Breast cancer Neg Hx     New Diagnoses (since last visit): Denies any new medical diagnoses  Family Support: Good  Lifestyle Diet: Breakfast:cereal Lunch:sandwich Dinner:bratwurst and potatoes Drinks:water  Exercise: Patient reports walking some but leg pain is a limitation to exercise    Social History   Substance and Sexual Activity  Alcohol Use Yes  . Alcohol/week: 0.0 - 1.0 standard drinks   Comment: occasionally   History of alcohol abuse in past per patient. Now drinks infrequently.    Social History   Tobacco Use  Smoking Status Current Every Day Smoker  . Packs/day: 0.50  . Years: 20.00  . Pack years: 10.00  . Types: Cigarettes  Smokeless Tobacco Never Used   Currently smoking 1/2 PPD. Not interested in quitting at this time.    Health Maintenance  Topic Date Due  . OPHTHALMOLOGY EXAM  Never done  . HIV Screening  Never done  . TETANUS/TDAP  Never done  . COLONOSCOPY  Never done  . HEMOGLOBIN A1C  01/08/2020  . INFLUENZA VACCINE  02/03/2020  . FOOT EXAM  04/10/2020  . PAP SMEAR-Modifier  04/20/2020  . MAMMOGRAM  08/16/2020  . PNEUMOCOCCAL POLYSACCHARIDE VACCINE AGE 23-64 HIGH RISK  Completed  . Hepatitis C Screening  Completed   Outpatient Encounter Medications as of 10/15/2019  Medication Sig  . acetaminophen (TYLENOL)  500 MG tablet Take 500 mg by mouth every 6 (six) hours as needed.  Marland Kitchen atenolol (TENORMIN) 25 MG tablet   . cetirizine (ZYRTEC) 10 MG tablet Take 1 tablet (10 mg total) by mouth daily.  Marland Kitchen FLUoxetine (PROZAC) 10 MG capsule Take 10 mg by mouth.  . Fluticasone Propionate (FLONASE NA) OTC  . hydrochlorothiazide (HYDRODIURIL) 25 MG tablet Take 1 tablet (25 mg total) by mouth daily. (Patient taking differently: Take 25 mg by mouth daily. As needed)  . levothyroxine (SYNTHROID) 175 MCG tablet TAKE ONE TABLET BY MOUTH EVERY DAY BEFORE BREAKFAST  . LORazepam (ATIVAN) 1 MG tablet Take 1 mg by mouth.  . losartan (COZAAR) 100 MG tablet Take 1 tablet (100 mg total) by mouth daily.  . pantoprazole (PROTONIX) 40 MG tablet Take by mouth.  Marland Kitchen albuterol (VENTOLIN HFA) 108 (90 Base) MCG/ACT inhaler Inhale into the lungs.  . Fluticasone-Salmeterol (ADVAIR) 100-50 MCG/DOSE AEPB Inhale 1 puff into the lungs 2 (two) times daily. Reported on 10/22/2015   No facility-administered encounter medications on file as of 10/15/2019.    Health Maintenance/Date Completed  Last ED visit: Denies recent ED visits PCP: [facility] (Dr. Candelaria Stagers) Specialist Visit: Plastic surgeon (deviated septum), ENT  Dental Exam: No dental insurance. Patient reports she does not have any teeth. Does not wear dentures.  Eye Exam: Wears glasses. Reports upcoming eye exam on 05/14 Pelvic/PAP Exam: Due Mammogram: Reports annual mammogram upcoming 4/13 DEXA: <56 y/o  Colonoscopy: Never Flu Vaccine: Endorses receipt Pneumonia Vaccine: 04/11/2019 COVID-19 Vaccine: Due Shingrix Vaccine: Due  Assessment and Plan:  Asthma -Advair 100-50 mcg BID, albuterol PRN -Patient reports taking inhalers as needed per her doctor. Reports breathing has been stable and she has not required these medications recently -Patient is aware to wash her mouth out after using Advair -Patient is still smoking 1/2 PPD and is not interested in smoking cessation at this  time  Hypertension -Atenolol 25 mg daily, losartan 100 mg daily, HCTZ 25 mg daily -Patient reports taking these medications when she remembers and HCTZ as needed -Patient is not checking blood pressure regularly at home. Does not have a functional blood pressure monitoring device  Prediabetes -Last Hgb A1C was 6.4% on 07/11/2019 which has remained relatively stable over the last couple of years -Lifestyle management only -Encouraged patient to exercise more and watch carbohydrate intake  Hypothyroidism -Levothyroxine 175 mcg qAM on an empty stomach -Last thyroid testing on 07/11/2019 with TSH 7.640 (elevated) and T4 10.5 (within normal limits)  Mood disorder -Fluoxetine 10 mg daily  Seasonal allergies -Cetirizine 10 mg daily, Flonase   GERD -pantoprazole 40 mg daily  Restless legs syndrome -lorazepam 1 mg at bedtime as needed  Medication adherence -Patient reports she takes her medication when she remembers and states that she has a short term memory problem -Patient endorses using a pillbox -Encouraged patient to set alarm to help with medication adherence -Requesting a refill on levothyroxine  RTC 1 year for MTM  Loop Resident 15 October 2019

## 2019-10-16 ENCOUNTER — Ambulatory Visit: Payer: Self-pay | Attending: Oncology | Admitting: *Deleted

## 2019-10-16 ENCOUNTER — Ambulatory Visit
Admission: RE | Admit: 2019-10-16 | Discharge: 2019-10-16 | Disposition: A | Discharge status: Home / Self Care | Payer: Self-pay | Source: Ambulatory Visit | Attending: Oncology | Admitting: Oncology

## 2019-10-16 VITALS — BP 159/90 | HR 75 | Temp 98.5°F | Ht <= 58 in | Wt 199.7 lb

## 2019-10-16 DIAGNOSIS — Z0000 Encounter for general adult medical examination without abnormal findings: Secondary | ICD-10-CM | POA: Insufficient documentation

## 2019-10-16 NOTE — Patient Instructions (Signed)
Gave patient hand-out, Women Staying Healthy, Active and Well from [facility], with education on breast health, pap smears, heart and colon health. 

## 2019-10-16 NOTE — Progress Notes (Signed)
  Subjective:     Patient ID: Lindsey Benjamin, female   DOB: 05/06/1966, 54 y.o.   MRN: [id number]  HPI   Review of Systems     Objective:   Physical Exam Chest:     Breasts:        Right: No swelling, bleeding, inverted nipple, mass, nipple discharge, skin change or tenderness.        Left: No swelling, bleeding, inverted nipple, mass, nipple discharge, skin change or tenderness.    Lymphadenopathy:     Upper Body:     Right upper body: No supraclavicular or axillary adenopathy.     Left upper body: No supraclavicular or axillary adenopathy.        Assessment:     54 year old White female returns to [facility] screening.  Clinical breast exam unremarkable.  Taught self breast awareness.  Last pap on 04/15/17 was negative / negative.  Next pap due in 2023.  Patient has been screened for eligibility.  She does not have any insurance, Medicare or Medicaid.  She also meets financial eligibility.   Risk Assessment    No risk assessment data for the current encounter   Risk Scores      07/13/2019   Last edited by: Alta Corning, CMA   5-year risk: 1.1 %   Lifetime risk: 7.4 %            Plan:     Screening mammogram ordered.   Will follow up per [facility] protocol.

## 2019-10-24 ENCOUNTER — Other Ambulatory Visit: Payer: Self-pay

## 2019-10-24 DIAGNOSIS — I10 Essential (primary) hypertension: Secondary | ICD-10-CM

## 2019-10-25 LAB — HEMOGLOBIN A1C
Est. average glucose Bld gHb Est-mCnc: 140 mg/dL
Hgb A1c MFr Bld: 6.5 % — ABNORMAL HIGH (ref 4.8–5.6)

## 2019-10-25 LAB — COMPREHENSIVE METABOLIC PANEL
ALT: 52 IU/L — ABNORMAL HIGH (ref 0–32)
AST: 40 IU/L (ref 0–40)
Albumin/Globulin Ratio: 1.4 (ref 1.2–2.2)
Albumin: 4.1 g/dL (ref 3.8–4.9)
Alkaline Phosphatase: 88 IU/L (ref 39–117)
BUN/Creatinine Ratio: 12 (ref 9–23)
BUN: 10 mg/dL (ref 6–24)
Bilirubin Total: 0.3 mg/dL (ref 0.0–1.2)
CO2: 25 mmol/L (ref 20–29)
Calcium: 9 mg/dL (ref 8.7–10.2)
Chloride: 99 mmol/L (ref 96–106)
Creatinine, Ser: 0.84 mg/dL (ref 0.57–1.00)
GFR calc Af Amer: 90 mL/min/{1.73_m2} (ref >59)
GFR calc non Af Amer: 78 mL/min/{1.73_m2} (ref >59)
Globulin, Total: 3 g/dL (ref 1.5–4.5)
Glucose: 108 mg/dL — ABNORMAL HIGH (ref 65–99)
Potassium: 3.9 mmol/L (ref 3.5–5.2)
Sodium: 138 mmol/L (ref 134–144)
Total Protein: 7.1 g/dL (ref 6.0–8.5)

## 2019-10-25 LAB — CBC
Hematocrit: 44.6 % (ref 34.0–46.6)
Hemoglobin: 15.3 g/dL (ref 11.1–15.9)
MCH: 29.4 pg (ref 26.6–33.0)
MCHC: 34.3 g/dL (ref 31.5–35.7)
MCV: 86 fL (ref 79–97)
Platelets: 210 10*3/uL (ref 150–450)
RBC: 5.21 x10E6/uL (ref 3.77–5.28)
RDW: 13.3 % (ref 11.7–15.4)
WBC: 7.3 10*3/uL (ref 3.4–10.8)

## 2019-10-25 LAB — LIPID PANEL
Chol/HDL Ratio: 5.2 ratio — ABNORMAL HIGH (ref 0.0–4.4)
Cholesterol, Total: 197 mg/dL (ref 100–199)
HDL: 38 mg/dL — ABNORMAL LOW (ref >39)
LDL Chol Calc (NIH): 123 mg/dL — ABNORMAL HIGH (ref 0–99)
Triglycerides: 203 mg/dL — ABNORMAL HIGH (ref 0–149)
VLDL Cholesterol Cal: 36 mg/dL (ref 5–40)

## 2019-10-25 LAB — T4 AND TSH
T4, Total: 9.9 ug/dL (ref 4.5–12.0)
TSH: 5.99 u[IU]/mL — ABNORMAL HIGH (ref 0.450–4.500)

## 2019-10-25 LAB — GAMMA GT: GGT: 82 IU/L — ABNORMAL HIGH (ref 0–60)

## 2019-10-31 ENCOUNTER — Ambulatory Visit: Payer: Self-pay | Admitting: Internal Medicine

## 2019-10-31 ENCOUNTER — Other Ambulatory Visit: Payer: Self-pay | Admitting: Internal Medicine

## 2019-10-31 ENCOUNTER — Other Ambulatory Visit: Payer: Self-pay

## 2019-10-31 VITALS — BP 150/100

## 2019-10-31 DIAGNOSIS — K760 Fatty (change of) liver, not elsewhere classified: Secondary | ICD-10-CM

## 2019-10-31 DIAGNOSIS — G4730 Sleep apnea, unspecified: Secondary | ICD-10-CM

## 2019-10-31 DIAGNOSIS — E039 Hypothyroidism, unspecified: Secondary | ICD-10-CM

## 2019-10-31 DIAGNOSIS — I10 Essential (primary) hypertension: Secondary | ICD-10-CM

## 2019-10-31 MED ORDER — LANSOPRAZOLE 30 MG PO CPDR: 30.0000 mg | DELAYED_RELEASE_CAPSULE | Freq: Every day | ORAL | 3 refills | Status: DC

## 2019-10-31 NOTE — Progress Notes (Signed)
   Subjective:    Patient ID: BRIANNON BOGGIO, female    DOB: 11/30/1965, 54 y.o.   MRN: [id number]  HPI  Patient is a 54 year old female presenting for a follow-up of hypertension. Patient reports she has been doing well.   Review of Systems Patient Active Problem List   Diagnosis Date Noted  . Pain due to onychomycosis of toenails of both feet 04/30/2019  . Pincer nail deformity 04/30/2019  . 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 Hepatic steatosis 10/19/2017  . Sinusitis 01/11/2017  . Prediabetes 10/22/2015  . Essential hypertension 07/23/2015  . Sleep apnea 07/23/2015  . Hypothyroidism 07/23/2015   Allergies as of 10/31/2019      Reactions   Ofloxacin Swelling   Penicillins Anaphylaxis   Ranitidine Hives   Lisinopril Cough   Ceclor [cefaclor] Hives   Zantac [ranitidine Hcl]    hives   Levaquin [levofloxacin In D5w] Rash   Peel her skin   Levofloxacin Other (See Comments), Rash   Tomato Rash      Medication List       Accurate as of October 31, 2019 10:00 AM. If you have any questions, ask your nurse or doctor.        STOP taking these medications   pantoprazole 40 MG tablet Commonly known as: PROTONIX Stopped by: Tawni Millers, MD     TAKE these medications   acetaminophen 500 MG tablet Commonly known as: TYLENOL Take 500 mg by mouth every 6 (six) hours as needed.   atenolol 25 MG tablet Commonly known as: TENORMIN   cetirizine 10 MG tablet Commonly known as: ZYRTEC Take 1 tablet (10 mg total) by mouth daily.   FLONASE NA OTC   FLUoxetine 10 MG capsule Commonly known as: PROZAC Take 10 mg by mouth.   Fluticasone-Salmeterol 100-50 MCG/DOSE Aepb Commonly known as: ADVAIR Inhale 1 puff into the lungs 2 (two) times daily. Reported on 10/22/2015   hydrochlorothiazide 25 MG tablet Commonly known as: HYDRODIURIL Take 1 tablet (25 mg total) by mouth daily. What changed: additional instructions   levothyroxine 175 MCG tablet Commonly known  as: SYNTHROID TAKE ONE TABLET BY MOUTH EVERY DAY BEFORE BREAKFAST   LORazepam 1 MG tablet Commonly known as: ATIVAN Take 1 mg by mouth.   losartan 100 MG tablet Commonly known as: COZAAR Take 1 tablet (100 mg total) by mouth daily.   Ventolin HFA 108 (90 Base) MCG/ACT inhaler Generic drug: albuterol Inhale into the lungs.          Objective:   Physical Exam   No PE     Assessment & Plan:  1. Essential hypertension BP remains problematic. No further adjustment made at this visit. - Comp Met (CMET); Future - CBC; Future - UA/M w/rflx Culture, Routine; Future - TSH; Future - HgB A1c; Future  2. Sleep apnea, unspecified type Undergoing ENT procedures in an attempt to improve.   3. Hepatic steatosis Mild abnormal liver functions persist.  4. Hypothyroidism, unspecified type Laboratory test suggest she is therapeutic. No change in medication.   FU in 6 months with labs a week prior.

## 2019-11-15 ENCOUNTER — Other Ambulatory Visit: Payer: Self-pay | Admitting: Internal Medicine

## 2020-01-10 ENCOUNTER — Encounter (INDEPENDENT_AMBULATORY_CARE_PROVIDER_SITE_OTHER): Payer: Self-pay

## 2020-01-23 ENCOUNTER — Other Ambulatory Visit: Payer: Self-pay

## 2020-01-30 ENCOUNTER — Ambulatory Visit: Payer: Self-pay | Admitting: Internal Medicine

## 2020-02-20 ENCOUNTER — Telehealth: Payer: Self-pay | Admitting: Pharmacist

## 2020-02-20 NOTE — Telephone Encounter (Signed)
02/20/2020 11:55:05 AM - [employer] renewal for Advair&Ventolin to pt & dr  -- Lindsey Benjamin - Wednesday, February 20, 2020 11:54 AM --[facility] renewal for Advair & Ventolin--mailing patient her portion to sign & return, also sending scripts to provider to sign & return.

## 2020-03-20 ENCOUNTER — Telehealth: Payer: Self-pay | Admitting: Pharmacist

## 2020-03-20 NOTE — Telephone Encounter (Signed)
03/20/2020 8:49:33 AM - [facility] renewal faxed for Ventolin & Advair  -- [name] - Thursday, March 20, 2020 8:48 AM --Faxed [facility] renewal for Ventolin & Advair 100/50.

## 2020-04-04 ENCOUNTER — Other Ambulatory Visit: Payer: Self-pay | Admitting: Gerontology

## 2020-04-23 ENCOUNTER — Other Ambulatory Visit: Payer: Self-pay

## 2020-04-23 DIAGNOSIS — I10 Essential (primary) hypertension: Secondary | ICD-10-CM

## 2020-04-24 ENCOUNTER — Telehealth: Payer: Self-pay | Admitting: Pharmacist

## 2020-04-24 LAB — COMPREHENSIVE METABOLIC PANEL
ALT: 29 IU/L (ref 0–32)
AST: 36 IU/L (ref 0–40)
Albumin/Globulin Ratio: 1.6 (ref 1.2–2.2)
Albumin: 4.4 g/dL (ref 3.8–4.9)
Alkaline Phosphatase: 73 IU/L (ref 44–121)
BUN/Creatinine Ratio: 11 (ref 9–23)
BUN: 10 mg/dL (ref 6–24)
Bilirubin Total: 0.4 mg/dL (ref 0.0–1.2)
CO2: 25 mmol/L (ref 20–29)
Calcium: 8.8 mg/dL (ref 8.7–10.2)
Chloride: 98 mmol/L (ref 96–106)
Creatinine, Ser: 0.89 mg/dL (ref 0.57–1.00)
GFR calc Af Amer: 84 mL/min/{1.73_m2} (ref >59)
GFR calc non Af Amer: 73 mL/min/{1.73_m2} (ref >59)
Globulin, Total: 2.7 g/dL (ref 1.5–4.5)
Glucose: 122 mg/dL — ABNORMAL HIGH (ref 65–99)
Potassium: 4.3 mmol/L (ref 3.5–5.2)
Sodium: 138 mmol/L (ref 134–144)
Total Protein: 7.1 g/dL (ref 6.0–8.5)

## 2020-04-24 LAB — CBC
Hematocrit: 48.3 % — ABNORMAL HIGH (ref 34.0–46.6)
Hemoglobin: 15.9 g/dL (ref 11.1–15.9)
MCH: 28.4 pg (ref 26.6–33.0)
MCHC: 32.9 g/dL (ref 31.5–35.7)
MCV: 86 fL (ref 79–97)
Platelets: 214 10*3/uL (ref 150–450)
RBC: 5.59 x10E6/uL — ABNORMAL HIGH (ref 3.77–5.28)
RDW: 13.4 % (ref 11.7–15.4)
WBC: 6.9 10*3/uL (ref 3.4–10.8)

## 2020-04-24 LAB — HEMOGLOBIN A1C
Est. average glucose Bld gHb Est-mCnc: 137 mg/dL
Hgb A1c MFr Bld: 6.4 % — ABNORMAL HIGH (ref 4.8–5.6)

## 2020-04-24 LAB — TSH: TSH: 12.1 u[IU]/mL — ABNORMAL HIGH (ref 0.450–4.500)

## 2020-04-24 NOTE — Telephone Encounter (Signed)
04/24/2020 9:44:58 AM - ProAir HFA application to pat & dr  -- [name] - Thursday, April 24, 2020 9:43 AM --Cleared with [employer]-ProAir HFA to replace Ventolin-Printed [employer] application-mailing patient her portion to sign & return, also sending to [facility] for Dr. Candelaria Stagers to sign.

## 2020-04-29 ENCOUNTER — Other Ambulatory Visit: Payer: Self-pay

## 2020-04-30 ENCOUNTER — Other Ambulatory Visit: Payer: Self-pay

## 2020-04-30 ENCOUNTER — Ambulatory Visit: Payer: Self-pay | Admitting: Internal Medicine

## 2020-04-30 ENCOUNTER — Other Ambulatory Visit: Payer: Self-pay | Admitting: Internal Medicine

## 2020-04-30 DIAGNOSIS — R7303 Prediabetes: Secondary | ICD-10-CM

## 2020-04-30 DIAGNOSIS — E039 Hypothyroidism, unspecified: Secondary | ICD-10-CM

## 2020-04-30 DIAGNOSIS — I10 Essential (primary) hypertension: Secondary | ICD-10-CM

## 2020-04-30 MED ORDER — LEVOTHYROXINE SODIUM 175 MCG PO TABS: 175.0000 ug | ORAL_TABLET | Freq: Every day | ORAL | 3 refills | Status: DC

## 2020-04-30 NOTE — Progress Notes (Signed)
Established Patient Office Visit  Subjective:  Patient ID: Lindsey Benjamin, female    DOB: 11-11-1965  Age: 54 y.o. MRN: [id number]  CC:  Chief Complaint  Patient presents with   Follow-up   Hypertension    HPI Lindsey Benjamin presents for fu. Pt reports delay in surgery due to COVID and shortage of beds but reports surgery is scheduled for Nov 8th.  Past Medical History:  Diagnosis Date   Asthma    Hypertension    Hypothyroidism     Past Surgical History:  Procedure Laterality Date   CESAREAN SECTION     CHOLECYSTECTOMY      Family History  Problem Relation Age of Onset   Breast cancer Neg Hx     Social History   Socioeconomic History   Marital status: Married    Spouse name: Not on file   Number of children: Not on file   Years of education: Not on file   Highest education level: Not on file  Occupational History   Not on file  Tobacco Use   Smoking status: Current Every Day Smoker    Packs/day: 0.50    Years: 20.00    Pack years: 10.00    Types: Cigarettes   Smokeless tobacco: Never Used  Substance and Sexual Activity   Alcohol use: Yes    Alcohol/week: 0.0 - 1.0 standard drinks    Comment: occasionally   Drug use: No   Sexual activity: Not on file  Other Topics Concern   Not on file  Social History Narrative   Is on food stamps but they will be cut in half starting next month. She is not worried about this        lives in boarding house      Pt said she is doing well and Is not currently interested in potential aid    Social Determinants of Health   Financial Resource Strain:    Difficulty of Paying Living Expenses: Not on file  Food Insecurity:    Worried About Programme researcher, broadcasting/film/video in the Last Year: Not on file   [employer] of Food in the Last Year: Not on file  Transportation Needs:    Lack of Transportation (Medical): Not on file   Lack of Transportation (Non-Medical): Not on file  Physical Activity:     Days of Exercise per Week: Not on file   Minutes of Exercise per Session: Not on file  Stress:    Feeling of Stress : Not on file  Social Connections:    Frequency of Communication with Friends and Family: Not on file   Frequency of Social Gatherings with Friends and Family: Not on file   Attends Religious Services: Not on file   Active Member of Clubs or Organizations: Not on file   Attends Banker Meetings: Not on file   Marital Status: Not on file  Intimate Partner Violence:    Fear of Current or Ex-Partner: Not on file   Emotionally Abused: Not on file   Physically Abused: Not on file   Sexually Abused: Not on file    Outpatient Medications Prior to Visit  Medication Sig Dispense Refill   acetaminophen (TYLENOL) 500 MG tablet Take 500 mg by mouth every 6 (six) hours as needed.     albuterol (VENTOLIN HFA) 108 (90 Base) MCG/ACT inhaler Inhale into the lungs.     atenolol (TENORMIN) 25 MG tablet      cetirizine (ZYRTEC) 10  MG tablet Take 1 tablet (10 mg total) by mouth daily. 90 tablet 1   FLUoxetine (PROZAC) 10 MG capsule Take 10 mg by mouth.     Fluticasone Propionate (FLONASE NA) OTC     Fluticasone-Salmeterol (ADVAIR) 100-50 MCG/DOSE AEPB Inhale 1 puff into the lungs 2 (two) times daily. Reported on 10/22/2015     hydrochlorothiazide (HYDRODIURIL) 25 MG tablet Take 1 tablet (25 mg total) by mouth daily. (Patient taking differently: Take 25 mg by mouth daily. As needed) 90 tablet 3   lansoprazole (PREVACID) 30 MG capsule Take 1 capsule (30 mg total) by mouth daily at 12 noon. 90 capsule 3   levothyroxine (SYNTHROID) 175 MCG tablet TAKE ONE TABLET BY MOUTH EVERY DAY BEFORE BREAKFAST 60 tablet 0   LORazepam (ATIVAN) 1 MG tablet Take 1 mg by mouth. (Patient not taking: Reported on 04/30/2020)     losartan (COZAAR) 100 MG tablet Take 1 tablet (100 mg total) by mouth daily. 90 tablet 3   No facility-administered medications prior to visit.     Allergies  Allergen Reactions   Ofloxacin Swelling   Penicillins Anaphylaxis   Ranitidine Hives   Lisinopril Cough   Ceclor [Cefaclor] Hives   Zantac [Ranitidine Hcl]     hives   Levaquin [Levofloxacin In D5w] Rash    Peel her skin   Levofloxacin Other (See Comments) and Rash   Tomato Rash    ROS Review of Systems    Objective:    Physical Exam  There were no vitals taken for this visit. Wt Readings from Last 3 Encounters:  10/24/19 198 lb 3.2 oz (89.9 kg)  10/16/19 199 lb 11.2 oz (90.6 kg)  08/01/19 200 lb 14.4 oz (91.1 kg)     Health Maintenance Due  Topic Date Due   OPHTHALMOLOGY EXAM  Never done   COVID-19 Vaccine (1) Never done   HIV Screening  Never done   TETANUS/TDAP  Never done   COLONOSCOPY  Never done   PAP SMEAR-Modifier  04/20/2020    There are no preventive care reminders to display for this patient.  Lab Results  Component Value Date   TSH 12.100 (H) 04/23/2020   Lab Results  Component Value Date   WBC 6.9 04/23/2020   HGB 15.9 04/23/2020   HCT 48.3 (H) 04/23/2020   MCV 86 04/23/2020   PLT 214 04/23/2020   Lab Results  Component Value Date   NA 138 04/23/2020   K 4.3 04/23/2020   CO2 25 04/23/2020   GLUCOSE 122 (H) 04/23/2020   BUN 10 04/23/2020   CREATININE 0.89 04/23/2020   BILITOT 0.4 04/23/2020   ALKPHOS 73 04/23/2020   AST 36 04/23/2020   ALT 29 04/23/2020   PROT 7.1 04/23/2020   ALBUMIN 4.4 04/23/2020   CALCIUM 8.8 04/23/2020   Lab Results  Component Value Date   CHOL 197 10/24/2019   Lab Results  Component Value Date   HDL 38 (L) 10/24/2019   Lab Results  Component Value Date   LDLCALC 123 (H) 10/24/2019   Lab Results  Component Value Date   TRIG 203 (H) 10/24/2019   Lab Results  Component Value Date   CHOLHDL 5.2 (H) 10/24/2019   Lab Results  Component Value Date   HGBA1C 6.4 (H) 04/23/2020      Assessment & Plan:   Problem List Items Addressed This Visit       Cardiovascular and Mediastinum   Essential hypertension - Primary     Endocrine  Hypothyroidism     Other   Prediabetes      No orders of the defined types were placed in this encounter.  1. Essential hypertension Has difficulty getting an accurate reading due to blood pressure cuff causing pain. Last reading from [facility] source showing it to be elevated, pt reported a lot of overall pain that day. Plan to re-check in our office in a few weeks, no medication chaneg at this time.    2. Sinusitus Surgery planned for a corrective procedure at [facility] in a couple of weeks.   3. Hepatic Steatosis Liver function studies appear to have returned to normal ranges.   4.Hypothyroidism, unspecified type Recent labs suggest she is slipping to hypothyroid status, no changes in medication nor has pt reported missing any at current dose. Plan to recheck thyroid blood levels at earliest convenience before deciding to change medication dosage.   5. Patient has history of depressio Currently under control by outside medial consultant who has replaced her Prozac with Cymbalta. Lorazepam has been discontinued.   Gabapentin has also been added to her program.   Plan to check in December.   Follow-up: No follow-ups on file.     Raina Mina

## 2020-05-01 ENCOUNTER — Other Ambulatory Visit: Payer: Self-pay | Admitting: Internal Medicine

## 2020-05-05 ENCOUNTER — Telehealth: Payer: Self-pay | Admitting: Pharmacist

## 2020-05-05 LAB — UA/M W/RFLX CULTURE, ROUTINE
Bilirubin, UA: NEGATIVE
Glucose, UA: NEGATIVE
Ketones, UA: NEGATIVE
Leukocytes,UA: NEGATIVE
Nitrite, UA: POSITIVE — AB
RBC, UA: NEGATIVE
Specific Gravity, UA: 1.01 (ref 1.005–1.030)
Urobilinogen, Ur: 0.2 mg/dL (ref 0.2–1.0)
pH, UA: 8.5 — ABNORMAL HIGH (ref 5.0–7.5)

## 2020-05-05 LAB — MICROSCOPIC EXAMINATION
Casts: NONE SEEN /lpf
RBC, Urine: NONE SEEN /hpf (ref 0–2)

## 2020-05-05 LAB — URINE CULTURE, REFLEX

## 2020-05-05 NOTE — Telephone Encounter (Signed)
05/05/2020 9:56:21 AM - ProAir pending  -- [name] - Monday, May 05, 2020 9:54 AM --I have received the patient portion of [employer] application for Pro Air HFA--holding to receive the provider portion back, sent to [facility] 04/29/2020.

## 2020-05-09 ENCOUNTER — Telehealth: Payer: Self-pay | Admitting: Pharmacist

## 2020-05-09 NOTE — Telephone Encounter (Signed)
05/09/2020 8:56:35 AM - ProAir faxed to [employer]  -- [name] - Friday, May 09, 2020 8:55 AM --[name] application for [employer] HFA 0.9mg  Inhale 2 puffs four times daily (replaces Ventolin HFA).

## 2020-05-21 ENCOUNTER — Other Ambulatory Visit: Payer: Self-pay

## 2020-06-05 ENCOUNTER — Other Ambulatory Visit: Payer: Self-pay

## 2020-06-11 ENCOUNTER — Other Ambulatory Visit: Payer: Self-pay

## 2020-06-11 ENCOUNTER — Telehealth: Payer: Self-pay | Admitting: Internal Medicine

## 2020-06-11 DIAGNOSIS — I10 Essential (primary) hypertension: Secondary | ICD-10-CM

## 2020-06-12 LAB — MICROALBUMIN / CREATININE URINE RATIO
Creatinine, Urine: 15.8 mg/dL
Microalb/Creat Ratio: 275 mg/g creat — ABNORMAL HIGH (ref 0–29)
Microalbumin, Urine: 43.5 ug/mL

## 2020-06-12 LAB — URINALYSIS
Bilirubin, UA: NEGATIVE
Glucose, UA: NEGATIVE
Ketones, UA: NEGATIVE
Leukocytes,UA: NEGATIVE
Nitrite, UA: NEGATIVE
Protein,UA: NEGATIVE
Specific Gravity, UA: <=1.005 — AB (ref 1.005–1.030)
Urobilinogen, Ur: 0.2 mg/dL (ref 0.2–1.0)
pH, UA: 6 (ref 5.0–7.5)

## 2020-06-12 LAB — THYROID PANEL WITH TSH
Free Thyroxine Index: 3.8 (ref 1.2–4.9)
T3 Uptake Ratio: 31 % (ref 24–39)
T4, Total: 12.2 ug/dL — ABNORMAL HIGH (ref 4.5–12.0)
TSH: 1.76 u[IU]/mL (ref 0.450–4.500)

## 2020-06-17 ENCOUNTER — Other Ambulatory Visit: Payer: Self-pay | Admitting: Cardiology

## 2020-06-18 ENCOUNTER — Other Ambulatory Visit: Payer: Self-pay

## 2020-06-18 ENCOUNTER — Telehealth: Payer: Self-pay | Admitting: Internal Medicine

## 2020-06-18 DIAGNOSIS — I10 Essential (primary) hypertension: Secondary | ICD-10-CM

## 2020-06-18 DIAGNOSIS — G4730 Sleep apnea, unspecified: Secondary | ICD-10-CM

## 2020-06-18 DIAGNOSIS — F489 Nonpsychotic mental disorder, unspecified: Secondary | ICD-10-CM | POA: Insufficient documentation

## 2020-06-18 NOTE — Progress Notes (Signed)
Established Patient Office Visit  Subjective:  Patient ID: Lindsey Benjamin, female    DOB: 02-Aug-1965  Age: 54 y.o. MRN: [id number]  CC:  Chief Complaint  Patient presents with  . Follow-up  . Hypertension    HPI Lindsey Benjamin presents for fu after labs.  Past Medical History:  Diagnosis Date  . Asthma   . Hypertension   . Hypothyroidism     Past Surgical History:  Procedure Laterality Date  . CESAREAN SECTION    . CHOLECYSTECTOMY      Family History  Problem Relation Age of Onset  . Breast cancer Neg Hx     Social History   Socioeconomic History  . Marital status: Married    Spouse name: Not on file  . Number of children: Not on file  . Years of education: Not on file  . Highest education level: Not on file  Occupational History  . Not on file  Tobacco Use  . Smoking status: Current Every Day Smoker    Packs/day: 0.50    Years: 20.00    Pack years: 10.00    Types: Cigarettes  . Smokeless tobacco: Never Used  Substance and Sexual Activity  . Alcohol use: Yes    Alcohol/week: 0.0 - 1.0 standard drinks    Comment: occasionally  . Drug use: No  . Sexual activity: Not on file  Other Topics Concern  . Not on file  Social History Narrative   Is on food stamps but they will be cut in half starting next month. She is not worried about this        lives in boarding house      Pt said she is doing well and Is not currently interested in potential aid    Social Determinants of Health   Financial Resource Strain: Not on file  Food Insecurity: Not on file  Transportation Needs: Not on file  Physical Activity: Not on file  Stress: Not on file  Social Connections: Not on file  Intimate Partner Violence: Not on file    Outpatient Medications Prior to Visit  Medication Sig Dispense Refill  . acetaminophen (TYLENOL) 500 MG tablet Take 500 mg by mouth every 6 (six) hours as needed.    Marland Kitchen albuterol (VENTOLIN HFA) 108 (90 Base) MCG/ACT inhaler Inhale  into the lungs.    Marland Kitchen atenolol (TENORMIN) 25 MG tablet TAKE ONE TABLET BY MOUTH EVERY DAY 90 tablet 0  . cetirizine (ZYRTEC) 10 MG tablet Take 1 tablet (10 mg total) by mouth daily. 90 tablet 1  . FLUoxetine (PROZAC) 10 MG capsule Take 10 mg by mouth.    . Fluticasone Propionate (FLONASE NA) OTC    . Fluticasone-Salmeterol (ADVAIR) 100-50 MCG/DOSE AEPB Inhale 1 puff into the lungs 2 (two) times daily. Reported on 10/22/2015    . hydrochlorothiazide (HYDRODIURIL) 25 MG tablet Take 1 tablet (25 mg total) by mouth daily. (Patient taking differently: Take 25 mg by mouth daily. As needed) 90 tablet 3  . lansoprazole (PREVACID) 30 MG capsule Take 1 capsule (30 mg total) by mouth daily at 12 noon. 90 capsule 3  . levothyroxine (SYNTHROID) 175 MCG tablet Take 1 tablet (175 mcg total) by mouth daily before breakfast. 90 tablet 3  . LORazepam (ATIVAN) 1 MG tablet Take 1 mg by mouth. (Patient not taking: Reported on 04/30/2020)    . losartan (COZAAR) 100 MG tablet Take 1 tablet (100 mg total) by mouth daily. 90 tablet 3   No  facility-administered medications prior to visit.    Allergies  Allergen Reactions  . Ofloxacin Swelling  . Penicillins Anaphylaxis  . Ranitidine Hives  . Lisinopril Cough  . Ceclor [Cefaclor] Hives  . Zantac [Ranitidine Hcl]     hives  . Levaquin [Levofloxacin In D5w] Rash    Peel her skin  . Levofloxacin Other (See Comments) and Rash  . Tomato Rash    ROS Review of Systems    Objective:    Physical Exam  There were no vitals taken for this visit. Wt Readings from Last 3 Encounters:  10/24/19 198 lb 3.2 oz (89.9 kg)  10/16/19 199 lb 11.2 oz (90.6 kg)  08/01/19 200 lb 14.4 oz (91.1 kg)     Health Maintenance Due  Topic Date Due  . OPHTHALMOLOGY EXAM  Never done  . COVID-19 Vaccine (1) Never done  . HIV Screening  Never done  . TETANUS/TDAP  Never done  . COLONOSCOPY  Never done  . PAP SMEAR-Modifier  04/20/2020    There are no preventive care reminders  to display for this patient.  Lab Results  Component Value Date   TSH 1.760 06/11/2020   Lab Results  Component Value Date   WBC 6.9 04/23/2020   HGB 15.9 04/23/2020   HCT 48.3 (H) 04/23/2020   MCV 86 04/23/2020   PLT 214 04/23/2020   Lab Results  Component Value Date   NA 138 04/23/2020   K 4.3 04/23/2020   CO2 25 04/23/2020   GLUCOSE 122 (H) 04/23/2020   BUN 10 04/23/2020   CREATININE 0.89 04/23/2020   BILITOT 0.4 04/23/2020   ALKPHOS 73 04/23/2020   AST 36 04/23/2020   ALT 29 04/23/2020   PROT 7.1 04/23/2020   ALBUMIN 4.4 04/23/2020   CALCIUM 8.8 04/23/2020   Lab Results  Component Value Date   CHOL 197 10/24/2019   Lab Results  Component Value Date   HDL 38 (L) 10/24/2019   Lab Results  Component Value Date   LDLCALC 123 (H) 10/24/2019   Lab Results  Component Value Date   TRIG 203 (H) 10/24/2019   Lab Results  Component Value Date   CHOLHDL 5.2 (H) 10/24/2019   Lab Results  Component Value Date   HGBA1C 6.4 (H) 04/23/2020      Assessment & Plan:   Problem List Items Addressed This Visit      Cardiovascular and Mediastinum   Essential hypertension - Primary     Respiratory   Sleep apnea      No orders of the defined types were placed in this encounter. 1. Essential hypertension Recent blood pressure at [facility] 146/94. Currenlty under cardiology evaluation for abnormal EKG (s-t abnormality). Has echo cardiogram scheduled for February. Her increased from to Atenolol from 25 mg to 50mg  daily. Hydrodiuril discontinued.   2. Sleep apnea, unspecified type New evaluation being scheduled at [facility].   3.Sinusitis Recent e&t surgery   4. Hepatic Steatosis Liver tests are within normal range.   5.Hypothyroidism TSH Level is normal.  6.Mental Health Problem  Followed at [facility] for ? Depression and pain. Patient reports she was taken off Prozac and Lorazepam. Placed on Symbolta 30mg  at night and Gabapentin 600mg  (half a tablet at night).  Follow-up:  Return in about 12 weeks (around 09/10/2020).     

## 2020-06-25 ENCOUNTER — Other Ambulatory Visit: Payer: Self-pay | Admitting: Otology & Neurotology

## 2020-07-15 ENCOUNTER — Other Ambulatory Visit: Payer: Self-pay

## 2020-08-25 ENCOUNTER — Telehealth: Payer: Self-pay | Admitting: Pharmacist

## 2020-08-25 NOTE — Telephone Encounter (Signed)
08/25/2020 3:35:59 PM - Advair 250/50 refilled online with [employer]  -- [name] - Monday, August 25, 2020 3:34 PM -- Placed refill online with [employer] for Advair 100/50--Rx#[id number], order# [id number], allow 10-14 business days to receive.

## 2020-09-03 ENCOUNTER — Other Ambulatory Visit: Payer: Self-pay

## 2020-09-03 VITALS — BP 191/98 | HR 120 | Temp 96.8°F | Ht <= 58 in | Wt 193.2 lb

## 2020-09-03 DIAGNOSIS — I10 Essential (primary) hypertension: Secondary | ICD-10-CM

## 2020-09-05 LAB — URINALYSIS
Bilirubin, UA: NEGATIVE
Glucose, UA: NEGATIVE
Ketones, UA: NEGATIVE
Leukocytes,UA: NEGATIVE
Nitrite, UA: NEGATIVE
Protein,UA: NEGATIVE
RBC, UA: NEGATIVE
Specific Gravity, UA: <=1.005 — AB (ref 1.005–1.030)
Urobilinogen, Ur: 0.2 mg/dL (ref 0.2–1.0)
pH, UA: 8 — ABNORMAL HIGH (ref 5.0–7.5)

## 2020-09-05 LAB — LIPID PANEL
Chol/HDL Ratio: 4.6 ratio — ABNORMAL HIGH (ref 0.0–4.4)
Cholesterol, Total: 202 mg/dL — ABNORMAL HIGH (ref 100–199)
HDL: 44 mg/dL (ref >39)
LDL Chol Calc (NIH): 111 mg/dL — ABNORMAL HIGH (ref 0–99)
Triglycerides: 273 mg/dL — ABNORMAL HIGH (ref 0–149)
VLDL Cholesterol Cal: 47 mg/dL — ABNORMAL HIGH (ref 5–40)

## 2020-09-05 LAB — COMPREHENSIVE METABOLIC PANEL
ALT: 35 IU/L — ABNORMAL HIGH (ref 0–32)
AST: 43 IU/L — ABNORMAL HIGH (ref 0–40)
Albumin/Globulin Ratio: 1.5 (ref 1.2–2.2)
Albumin: 4.2 g/dL (ref 3.8–4.9)
Alkaline Phosphatase: 72 IU/L (ref 44–121)
BUN/Creatinine Ratio: 10 (ref 9–23)
BUN: 8 mg/dL (ref 6–24)
Bilirubin Total: <0.2 mg/dL (ref 0.0–1.2)
CO2: 23 mmol/L (ref 20–29)
Calcium: 9.1 mg/dL (ref 8.7–10.2)
Chloride: 98 mmol/L (ref 96–106)
Creatinine, Ser: 0.8 mg/dL (ref 0.57–1.00)
Globulin, Total: 2.8 g/dL (ref 1.5–4.5)
Glucose: 75 mg/dL (ref 65–99)
Potassium: 4.1 mmol/L (ref 3.5–5.2)
Sodium: 137 mmol/L (ref 134–144)
Total Protein: 7 g/dL (ref 6.0–8.5)
eGFR: 86 mL/min/{1.73_m2} (ref >59)

## 2020-09-05 LAB — MICROALBUMIN / CREATININE URINE RATIO
Creatinine, Urine: 11 mg/dL
Microalb/Creat Ratio: 141 mg/g creat — ABNORMAL HIGH (ref 0–29)
Microalbumin, Urine: 15.5 ug/mL

## 2020-09-05 LAB — THYROID PANEL WITH TSH
Free Thyroxine Index: 2 (ref 1.2–4.9)
T3 Uptake Ratio: 25 % (ref 24–39)
T4, Total: 7.9 ug/dL (ref 4.5–12.0)
TSH: 20.6 u[IU]/mL — ABNORMAL HIGH (ref 0.450–4.500)

## 2020-09-05 LAB — CBC WITH DIFFERENTIAL/PLATELET
Basophils Absolute: 0.1 10*3/uL (ref 0.0–0.2)
Basos: 1 %
EOS (ABSOLUTE): 0.2 10*3/uL (ref 0.0–0.4)
Eos: 2 %
Hematocrit: 47.1 % — ABNORMAL HIGH (ref 34.0–46.6)
Hemoglobin: 15.3 g/dL (ref 11.1–15.9)
Immature Grans (Abs): 0 10*3/uL (ref 0.0–0.1)
Immature Granulocytes: 0 %
Lymphocytes Absolute: 2.6 10*3/uL (ref 0.7–3.1)
Lymphs: 37 %
MCH: 28.8 pg (ref 26.6–33.0)
MCHC: 32.5 g/dL (ref 31.5–35.7)
MCV: 89 fL (ref 79–97)
Monocytes Absolute: 0.6 10*3/uL (ref 0.1–0.9)
Monocytes: 9 %
Neutrophils Absolute: 3.4 10*3/uL (ref 1.4–7.0)
Neutrophils: 51 %
Platelets: 205 10*3/uL (ref 150–450)
RBC: 5.32 x10E6/uL — ABNORMAL HIGH (ref 3.77–5.28)
RDW: 13.5 % (ref 11.7–15.4)
WBC: 6.8 10*3/uL (ref 3.4–10.8)

## 2020-09-10 ENCOUNTER — Encounter: Payer: Self-pay | Admitting: Internal Medicine

## 2020-09-10 ENCOUNTER — Other Ambulatory Visit: Payer: Self-pay

## 2020-09-10 ENCOUNTER — Ambulatory Visit: Payer: Self-pay | Admitting: Internal Medicine

## 2020-09-10 DIAGNOSIS — I10 Essential (primary) hypertension: Secondary | ICD-10-CM

## 2020-09-10 DIAGNOSIS — G4730 Sleep apnea, unspecified: Secondary | ICD-10-CM

## 2020-09-10 DIAGNOSIS — E039 Hypothyroidism, unspecified: Secondary | ICD-10-CM

## 2020-09-10 NOTE — Progress Notes (Signed)
Established Patient Office Visit  Subjective:  Patient ID: Lindsey Benjamin, female    DOB: 1965/12/19  Age: 55 y.o. MRN: [id number]  CC:  Chief Complaint  Patient presents with  . Follow-up    HPI Lindsey Benjamin presents for follow up.   Past Medical History:  Diagnosis Date  . Asthma   . Hypertension   . Hypothyroidism     Past Surgical History:  Procedure Laterality Date  . CESAREAN SECTION    . CHOLECYSTECTOMY      Family History  Problem Relation Age of Onset  . Breast cancer Neg Hx     Social History   Socioeconomic History  . Marital status: Married    Spouse name: Not on file  . Number of children: Not on file  . Years of education: Not on file  . Highest education level: Not on file  Occupational History  . Not on file  Tobacco Use  . Smoking status: Current Every Day Smoker    Packs/day: 0.50    Years: 20.00    Pack years: 10.00    Types: Cigarettes  . Smokeless tobacco: Never Used  Substance and Sexual Activity  . Alcohol use: Yes    Alcohol/week: 0.0 - 1.0 standard drinks    Comment: occasionally  . Drug use: No  . Sexual activity: Not on file  Other Topics Concern  . Not on file  Social History Narrative   Is on food stamps but they will be cut in half starting next month. She is not worried about this        lives in boarding house      Pt said she is doing well and Is not currently interested in potential aid    Social Determinants of Health   Financial Resource Strain: Not on file  Food Insecurity: Not on file  Transportation Needs: Not on file  Physical Activity: Not on file  Stress: Not on file  Social Connections: Not on file  Intimate Partner Violence: Not on file    Outpatient Medications Prior to Visit  Medication Sig Dispense Refill  . acetaminophen (TYLENOL) 500 MG tablet Take 500 mg by mouth every 6 (six) hours as needed.    Marland Kitchen albuterol (VENTOLIN HFA) 108 (90 Base) MCG/ACT inhaler Inhale into the lungs.     Marland Kitchen atenolol (TENORMIN) 25 MG tablet TAKE ONE TABLET BY MOUTH EVERY DAY 90 tablet 0  . cetirizine (ZYRTEC) 10 MG tablet Take 1 tablet (10 mg total) by mouth daily. 90 tablet 1  . FLUoxetine (PROZAC) 10 MG capsule Take 10 mg by mouth.    . Fluticasone Propionate (FLONASE NA) OTC    . Fluticasone-Salmeterol (ADVAIR) 100-50 MCG/DOSE AEPB Inhale 1 puff into the lungs 2 (two) times daily. Reported on 10/22/2015    . hydrochlorothiazide (HYDRODIURIL) 25 MG tablet Take 1 tablet (25 mg total) by mouth daily. (Patient taking differently: Take 25 mg by mouth daily. As needed) 90 tablet 3  . lansoprazole (PREVACID) 30 MG capsule Take 1 capsule (30 mg total) by mouth daily at 12 noon. 90 capsule 3  . levothyroxine (SYNTHROID) 175 MCG tablet Take 1 tablet (175 mcg total) by mouth daily before breakfast. 90 tablet 3  . LORazepam (ATIVAN) 1 MG tablet Take 1 mg by mouth. (Patient not taking: Reported on 04/30/2020)    . losartan (COZAAR) 100 MG tablet Take 1 tablet (100 mg total) by mouth daily. 90 tablet 3   No facility-administered medications prior  to visit.    Allergies  Allergen Reactions  . Ofloxacin Swelling  . Penicillins Anaphylaxis  . Ranitidine Hives  . Lisinopril Cough  . Ceclor [Cefaclor] Hives  . Zantac [Ranitidine Hcl]     hives  . Levaquin [Levofloxacin In D5w] Rash    Peel her skin  . Levofloxacin Other (See Comments) and Rash  . Tomato Rash    ROS Review of Systems    Objective:    Physical Exam  There were no vitals taken for this visit. Wt Readings from Last 3 Encounters:  09/03/20 193 lb 3.2 oz (87.6 kg)  10/24/19 198 lb 3.2 oz (89.9 kg)  10/16/19 199 lb 11.2 oz (90.6 kg)     Health Maintenance Due  Topic Date Due  . COVID-19 Vaccine (1) Never done  . OPHTHALMOLOGY EXAM  Never done  . HIV Screening  Never done  . TETANUS/TDAP  Never done  . COLONOSCOPY (Pts 45-74yrs Insurance coverage will need to be confirmed)  Never done  . PAP SMEAR-Modifier  04/20/2020     There are no preventive care reminders to display for this patient.  Lab Results  Component Value Date   TSH 20.600 (H) 09/03/2020   Lab Results  Component Value Date   WBC 6.8 09/03/2020   HGB 15.3 09/03/2020   HCT 47.1 (H) 09/03/2020   MCV 89 09/03/2020   PLT 205 09/03/2020   Lab Results  Component Value Date   NA 137 09/03/2020   K 4.1 09/03/2020   CO2 23 09/03/2020   GLUCOSE 75 09/03/2020   BUN 8 09/03/2020   CREATININE 0.80 09/03/2020   BILITOT <0.2 09/03/2020   ALKPHOS 72 09/03/2020   AST 43 (H) 09/03/2020   ALT 35 (H) 09/03/2020   PROT 7.0 09/03/2020   ALBUMIN 4.2 09/03/2020   CALCIUM 9.1 09/03/2020   Lab Results  Component Value Date   CHOL 202 (H) 09/03/2020   Lab Results  Component Value Date   HDL 44 09/03/2020   Lab Results  Component Value Date   LDLCALC 111 (H) 09/03/2020   Lab Results  Component Value Date   TRIG 273 (H) 09/03/2020   Lab Results  Component Value Date   CHOLHDL 4.6 (H) 09/03/2020   Lab Results  Component Value Date   HGBA1C 6.4 (H) 04/23/2020      Assessment & Plan:   Problem List Items Addressed This Visit      Cardiovascular and Mediastinum   Essential hypertension - Primary     Respiratory   Sleep apnea     Endocrine   Hypothyroidism      No orders of the defined types were placed in this encounter.  1. Essential hypertension Not at target, labs continue to show elevation.  2. Sleep apnea, unspecified type Evaluation coming up in a few weeks at [facility], does not have sleep ap machine.   3. Hypothyroidism, unspecified type Lab test show TSH is quite elevated, reports skipping a few doses of medication in recent residency move. Now living in apt.    4. Mental health  Under active management.    5. Hepatic steatosis Lab test continue to show mild elevation  Follow up labs in 1 week to see if medication needs to be reassesed    Follow-up: No follow-ups on file.     Raina Mina

## 2020-09-11 ENCOUNTER — Telehealth: Payer: Self-pay | Admitting: Pharmacist

## 2020-09-11 NOTE — Telephone Encounter (Signed)
Provided 2022 proof of income. Approved to receive medication assistance at [facility] until time for re-certification in 5364, and as long as eligibility criteria continues to be met.   [place]

## 2020-09-17 ENCOUNTER — Other Ambulatory Visit: Payer: Self-pay

## 2020-09-17 VITALS — BP 167/92 | HR 69 | Temp 98.3°F | Ht <= 58 in | Wt 190.6 lb

## 2020-09-17 DIAGNOSIS — I10 Essential (primary) hypertension: Secondary | ICD-10-CM

## 2020-09-17 NOTE — Progress Notes (Unsigned)
thy

## 2020-09-18 LAB — THYROID PANEL WITH TSH
Free Thyroxine Index: 2.4 (ref 1.2–4.9)
T3 Uptake Ratio: 26 % (ref 24–39)
T4, Total: 9.2 ug/dL (ref 4.5–12.0)
TSH: 12.2 u[IU]/mL — ABNORMAL HIGH (ref 0.450–4.500)

## 2020-09-24 ENCOUNTER — Encounter: Payer: Self-pay | Admitting: Internal Medicine

## 2020-09-24 ENCOUNTER — Ambulatory Visit: Payer: Self-pay | Admitting: Internal Medicine

## 2020-09-24 ENCOUNTER — Other Ambulatory Visit: Payer: Self-pay

## 2020-09-24 ENCOUNTER — Other Ambulatory Visit: Payer: Self-pay | Admitting: Internal Medicine

## 2020-09-24 DIAGNOSIS — I10 Essential (primary) hypertension: Secondary | ICD-10-CM

## 2020-09-24 DIAGNOSIS — E039 Hypothyroidism, unspecified: Secondary | ICD-10-CM

## 2020-09-24 DIAGNOSIS — J302 Other seasonal allergic rhinitis: Secondary | ICD-10-CM

## 2020-09-24 DIAGNOSIS — G4730 Sleep apnea, unspecified: Secondary | ICD-10-CM

## 2020-09-24 MED ORDER — CETIRIZINE HCL 10 MG PO TABS: 10.0000 mg | ORAL_TABLET | Freq: Every day | ORAL | 1 refills | Status: DC

## 2020-09-24 NOTE — Progress Notes (Signed)
Established Patient Office Visit  Subjective:  Patient ID: Lindsey Benjamin, female    DOB: 12-13-1965  Age: 55 y.o. MRN: [id number]  CC:  Chief Complaint  Patient presents with  . Follow-up    Lab fu for thyroid medication    HPI [name] presents for fu on lab results.   Past Medical History:  Diagnosis Date  . Asthma   . Hypertension   . Hypothyroidism     Past Surgical History:  Procedure Laterality Date  . CESAREAN SECTION    . CHOLECYSTECTOMY      Family History  Problem Relation Age of Onset  . Breast cancer Neg Hx     Social History   Socioeconomic History  . Marital status: Married    Spouse name: Not on file  . Number of children: Not on file  . Years of education: Not on file  . Highest education level: Not on file  Occupational History  . Not on file  Tobacco Use  . Smoking status: Current Every Day Smoker    Packs/day: 0.50    Years: 20.00    Pack years: 10.00    Types: Cigarettes  . Smokeless tobacco: Never Used  Substance and Sexual Activity  . Alcohol use: Yes    Alcohol/week: 0.0 - 1.0 standard drinks    Comment: occasionally  . Drug use: No  . Sexual activity: Not on file  Other Topics Concern  . Not on file  Social History Narrative   Is on food stamps but they will be cut in half starting next month. She is not worried about this        lives in boarding house      Pt said she is doing well and Is not currently interested in potential aid    Social Determinants of Health   Financial Resource Strain: Not on file  Food Insecurity: Not on file  Transportation Needs: Not on file  Physical Activity: Not on file  Stress: Not on file  Social Connections: Not on file  Intimate Partner Violence: Not on file    Outpatient Medications Prior to Visit  Medication Sig Dispense Refill  . acetaminophen (TYLENOL) 500 MG tablet Take 500 mg by mouth every 6 (six) hours as needed.    Marland Kitchen albuterol (VENTOLIN HFA) 108 (90 Base)  MCG/ACT inhaler Inhale into the lungs.    Marland Kitchen atenolol (TENORMIN) 25 MG tablet TAKE ONE TABLET BY MOUTH EVERY DAY 90 tablet 0  . cetirizine (ZYRTEC) 10 MG tablet Take 1 tablet (10 mg total) by mouth daily. 90 tablet 1  . FLUoxetine (PROZAC) 10 MG capsule Take 10 mg by mouth.    . Fluticasone Propionate (FLONASE NA) OTC    . Fluticasone-Salmeterol (ADVAIR) 100-50 MCG/DOSE AEPB Inhale 1 puff into the lungs 2 (two) times daily. Reported on 10/22/2015    . hydrochlorothiazide (HYDRODIURIL) 25 MG tablet Take 1 tablet (25 mg total) by mouth daily. (Patient taking differently: Take 25 mg by mouth daily. As needed) 90 tablet 3  . lansoprazole (PREVACID) 30 MG capsule Take 1 capsule (30 mg total) by mouth daily at 12 noon. 90 capsule 3  . levothyroxine (SYNTHROID) 175 MCG tablet Take 1 tablet (175 mcg total) by mouth daily before breakfast. 90 tablet 3  . LORazepam (ATIVAN) 1 MG tablet Take 1 mg by mouth. (Patient not taking: Reported on 04/30/2020)    . losartan (COZAAR) 100 MG tablet Take 1 tablet (100 mg total) by mouth  daily. 90 tablet 3   No facility-administered medications prior to visit.    Allergies  Allergen Reactions  . Ofloxacin Swelling  . Penicillins Anaphylaxis  . Ranitidine Hives  . Lisinopril Cough  . Ceclor [Cefaclor] Hives  . Zantac [Ranitidine Hcl]     hives  . Levaquin [Levofloxacin In D5w] Rash    Peel her skin  . Levofloxacin Other (See Comments) and Rash  . Tomato Rash    ROS Review of Systems    Objective:    Physical Exam  There were no vitals taken for this visit. Wt Readings from Last 3 Encounters:  09/17/20 190 lb 9.6 oz (86.5 kg)  09/03/20 193 lb 3.2 oz (87.6 kg)  10/24/19 198 lb 3.2 oz (89.9 kg)     Health Maintenance Due  Topic Date Due  . COVID-19 Vaccine (1) Never done  . OPHTHALMOLOGY EXAM  Never done  . HIV Screening  Never done  . TETANUS/TDAP  Never done  . COLONOSCOPY (Pts 45-3yrs Insurance coverage will need to be confirmed)  Never done   . PAP SMEAR-Modifier  04/20/2020    There are no preventive care reminders to display for this patient.  Lab Results  Component Value Date   TSH 12.200 (H) 09/17/2020   Lab Results  Component Value Date   WBC 6.8 09/03/2020   HGB 15.3 09/03/2020   HCT 47.1 (H) 09/03/2020   MCV 89 09/03/2020   PLT 205 09/03/2020   Lab Results  Component Value Date   NA 137 09/03/2020   K 4.1 09/03/2020   CO2 23 09/03/2020   GLUCOSE 75 09/03/2020   BUN 8 09/03/2020   CREATININE 0.80 09/03/2020   BILITOT <0.2 09/03/2020   ALKPHOS 72 09/03/2020   AST 43 (H) 09/03/2020   ALT 35 (H) 09/03/2020   PROT 7.0 09/03/2020   ALBUMIN 4.2 09/03/2020   CALCIUM 9.1 09/03/2020   Lab Results  Component Value Date   CHOL 202 (H) 09/03/2020   Lab Results  Component Value Date   HDL 44 09/03/2020   Lab Results  Component Value Date   LDLCALC 111 (H) 09/03/2020   Lab Results  Component Value Date   TRIG 273 (H) 09/03/2020   Lab Results  Component Value Date   CHOLHDL 4.6 (H) 09/03/2020   Lab Results  Component Value Date   HGBA1C 6.4 (H) 04/23/2020      Assessment & Plan:   Problem List Items Addressed This Visit      Cardiovascular and Mediastinum   Essential hypertension - Primary     Respiratory   Sleep apnea     Endocrine   Hypothyroidism      No orders of the defined types were placed in this encounter. 1. Essential hypertension Recent office readings are still far from target. Being followed by cardiologist at [facility]. Reports some difficulty swallowing losartan tablet, would like to consider discontinuing and increasing atenolol dose. Had taken lisinopril in past but was associated wit cough so switched to losartan. Reports cardiologist stopped hydrochlorothiazide and increased atenolol dose. FU with cardiologist in few weeks, advised her to further discuss this with them.  2. Hypothyroidism, unspecified type Recent labs from a few weeks ago suggested medication was not  strong enough. FU labs show TSH has improved without medication adjustments, pt reports taking medication on empty stomach. Plan is to continue current dose and recheck thyroid blood levels in 4 weeks.   3. Sleep apnea, unspecified type Still being evaluated at  [facility]. Has sleep study scheduled for April 4th.   4. Seasonal allergies Renewed Zyrtec medication as patient reports her landlord threw away her medication when moving.    Follow-up: Return in about 1 month (around 10/25/2020).  Labs April 27th and fu May 4th.     Raina Mina

## 2020-09-30 ENCOUNTER — Other Ambulatory Visit: Payer: Self-pay | Admitting: Cardiology

## 2020-10-07 ENCOUNTER — Other Ambulatory Visit: Payer: Self-pay

## 2020-10-07 MED FILL — Levothyroxine Sodium Tab 175 MCG: ORAL | 90 days supply | Qty: 90 | Fill #0 | Status: AC

## 2020-10-07 MED FILL — Duloxetine HCl Enteric Coated Pellets Cap 30 MG (Base Eq): ORAL | 30 days supply | Qty: 30 | Fill #0 | Status: AC

## 2020-10-08 ENCOUNTER — Other Ambulatory Visit: Payer: Self-pay

## 2020-10-27 ENCOUNTER — Other Ambulatory Visit: Payer: Self-pay

## 2020-10-28 ENCOUNTER — Other Ambulatory Visit: Payer: Self-pay

## 2020-10-28 MED ORDER — DULOXETINE HCL 30 MG PO CPEP
ORAL_CAPSULE | Freq: Two times a day (BID) | ORAL | 2 refills | Status: DC
  Filled 2020-10-28 – 2020-11-03 (×4): qty 60, 30d supply, fill #0
  Filled 2020-12-03: qty 60, 30d supply, fill #1
  Filled 2021-01-26: qty 60, 30d supply, fill #2

## 2020-10-28 MED ORDER — GABAPENTIN 600 MG PO TABS
ORAL_TABLET | ORAL | 2 refills | Status: DC
  Filled 2020-10-28: qty 15, 30d supply, fill #0
  Filled 2020-12-03: qty 15, 30d supply, fill #1
  Filled 2021-01-26: qty 15, 30d supply, fill #2

## 2020-10-29 ENCOUNTER — Other Ambulatory Visit: Payer: Self-pay

## 2020-10-29 DIAGNOSIS — E039 Hypothyroidism, unspecified: Secondary | ICD-10-CM

## 2020-10-29 NOTE — Progress Notes (Unsigned)
ferr

## 2020-10-30 LAB — IRON,TIBC AND FERRITIN PANEL
Ferritin: 103 ng/mL (ref 15–150)
Iron Saturation: 15 % (ref 15–55)
Iron: 69 ug/dL (ref 27–159)
Total Iron Binding Capacity: 449 ug/dL (ref 250–450)
UIBC: 380 ug/dL (ref 131–425)

## 2020-10-30 LAB — TSH: TSH: 1.86 u[IU]/mL (ref 0.450–4.500)

## 2020-10-30 LAB — T4: T4, Total: 12.5 ug/dL — ABNORMAL HIGH (ref 4.5–12.0)

## 2020-11-02 HISTORY — PX: CARDIAC CATHETERIZATION: SHX172

## 2020-11-03 ENCOUNTER — Other Ambulatory Visit: Payer: Self-pay

## 2020-11-05 ENCOUNTER — Other Ambulatory Visit: Payer: Self-pay

## 2020-11-05 ENCOUNTER — Encounter: Payer: Self-pay | Admitting: Internal Medicine

## 2020-11-05 ENCOUNTER — Ambulatory Visit: Payer: Self-pay | Admitting: Internal Medicine

## 2020-11-05 DIAGNOSIS — E039 Hypothyroidism, unspecified: Secondary | ICD-10-CM

## 2020-11-05 DIAGNOSIS — G4730 Sleep apnea, unspecified: Secondary | ICD-10-CM

## 2020-11-05 DIAGNOSIS — R7303 Prediabetes: Secondary | ICD-10-CM

## 2020-11-05 DIAGNOSIS — I10 Essential (primary) hypertension: Secondary | ICD-10-CM

## 2020-11-05 DIAGNOSIS — E785 Hyperlipidemia, unspecified: Secondary | ICD-10-CM | POA: Insufficient documentation

## 2020-11-05 NOTE — Progress Notes (Signed)
Established Patient Office Visit  Subjective:  Patient ID: Lindsey Benjamin, female    DOB: [birth year]  Age: 55 y.o. MRN: [id number]  CC:  Chief Complaint  Patient presents with  . Follow-up    FU from cardiologist  visit and PET scan     HPI Lindsey Benjamin presents for fu after visiting cardiologist and having PET scan at [facility] doctors.   Past Medical History:  Diagnosis Date  . Asthma   . Hypertension   . Hypothyroidism     Past Surgical History:  Procedure Laterality Date  . CESAREAN SECTION    . CHOLECYSTECTOMY      Family History  Problem Relation Age of Onset  . Breast cancer Neg Hx     Social History   Socioeconomic History  . Marital status: Married    Spouse name: Not on file  . Number of children: Not on file  . Years of education: Not on file  . Highest education level: Not on file  Occupational History  . Not on file  Tobacco Use  . Smoking status: Current Every Day Smoker    Packs/day: 0.50    Years: 20.00    Pack years: 10.00    Types: Cigarettes  . Smokeless tobacco: Never Used  Substance and Sexual Activity  . Alcohol use: Yes    Alcohol/week: 0.0 - 1.0 standard drinks    Comment: occasionally  . Drug use: No  . Sexual activity: Not on file  Other Topics Concern  . Not on file  Social History Narrative   Is on food stamps but they will be cut in half starting next month. She is not worried about this        lives in boarding house      Pt said she is doing well and Is not currently interested in potential aid    Social Determinants of Health   Financial Resource Strain: Not on file  Food Insecurity: Not on file  Transportation Needs: Not on file  Physical Activity: Not on file  Stress: Not on file  Social Connections: Not on file  Intimate Partner Violence: Not on file    Outpatient Medications Prior to Visit  Medication Sig Dispense Refill  . acetaminophen (TYLENOL) 500 MG tablet Take 500 mg by mouth every 6  (six) hours as needed.    Marland Kitchen ADVAIR DISKUS 100-50 MCG/DOSE AEPB INHALE 1 PUFF BY MOUTH EVERY 12 HOURS 180 each 3  . albuterol (VENTOLIN HFA) 108 (90 Base) MCG/ACT inhaler Inhale into the lungs.    Marland Kitchen atenolol (TENORMIN) 25 MG tablet TAKE ONE TABLET BY MOUTH EVERY DAY 90 tablet 0  . atenolol (TENORMIN) 25 MG tablet TAKE ONE TABLET BY MOUTH EVERY DAY 90 tablet 1  . atenolol (TENORMIN) 50 MG tablet TAKE ONE TABLET BY MOUTH EVERY DAY 90 tablet 3  . cetirizine (ZYRTEC) 10 MG tablet TAKE ONE TABLET BY MOUTH EVERY DAY 90 tablet 1  . DULoxetine (CYMBALTA) 30 MG capsule TAKE ONE CAPSULE BY MOUTH EVERY DAY 30 capsule 2  . DULoxetine (CYMBALTA) 30 MG capsule TAKE ONE CAPSULE (30MG TOTAL) BY MOUTH TWICE DAILY. 60 capsule 2  . FLUoxetine (PROZAC) 10 MG capsule Take 10 mg by mouth.    . Fluticasone Propionate (FLONASE NA) OTC    . Fluticasone-Salmeterol (ADVAIR) 100-50 MCG/DOSE AEPB Inhale 1 puff into the lungs 2 (two) times daily. Reported on 10/22/2015    . gabapentin (NEURONTIN) 600 MG tablet TAKE 1/2 TABLET  BY MOUTH AT BEDTIME 30 tablet 2  . gabapentin (NEURONTIN) 600 MG tablet TAKE ONE-HALF (1/2) TABLET (300MG TOTAL) BY MOUTH ONCE DAILY AT BEDTIME. 30 tablet 2  . hydrochlorothiazide (HYDRODIURIL) 25 MG tablet Take 1 tablet (25 mg total) by mouth daily. (Patient taking differently: Take 25 mg by mouth daily. As needed) 90 tablet 3  . hydrochlorothiazide (MICROZIDE) 12.5 MG capsule TAKE TWO CAPSULES BY MOUTH EVERY DAY 180 capsule 0  . lansoprazole (PREVACID) 30 MG capsule TAKE ONE CAPSULE BY MOUTH EVERY DAY AT NOON 90 capsule 3  . levothyroxine (SYNTHROID) 175 MCG tablet TAKE ONE TABLET BY MOUTH EVERY DAY BEFORE BREAKFAST. 90 tablet 3  . LORazepam (ATIVAN) 1 MG tablet Take 1 mg by mouth. (Patient not taking: Reported on 04/30/2020)    . losartan (COZAAR) 100 MG tablet Take 1 tablet (100 mg total) by mouth daily. 90 tablet 3  . losartan (COZAAR) 50 MG tablet TAKE TWO TABLETS BY MOUTH EVERY DAY 180 tablet 3  .  NEOMYCIN-POLYMYXIN-HYDROCORTISONE (CORTISPORIN) 1 % SOLN OTIC solution PLACE 4 DROPS INTO THE RIGHT EAR 2 TIMES A DAY FOR 14 DAYS 10 mL 1  . pravastatin (PRAVACHOL) 40 MG tablet TAKE ONE TABLET BY MOUTH EVERY DAY 30 tablet 11  . PROAIR HFA 108 (90 Base) MCG/ACT inhaler INHALE 2 PUFFS FOUR TIMES A DAY. REPLACES VENTOLIN. 34 g 3  . rosuvastatin (CRESTOR) 20 MG tablet TAKE ONE TABLET BY MOUTH EVERY DAY 90 tablet 3  . VENTOLIN HFA 108 (90 Base) MCG/ACT inhaler INHALE 2 PUFFS BY MOUTH FOUR TIMES A DAY 54 g 3   No facility-administered medications prior to visit.    Allergies  Allergen Reactions  . Ofloxacin Swelling  . Penicillins Anaphylaxis  . Ranitidine Hives  . Lisinopril Cough  . Ceclor [Cefaclor] Hives  . Zantac [Ranitidine Hcl]     hives  . Levaquin [Levofloxacin In D5w] Rash    Peel her skin  . Levofloxacin Other (See Comments) and Rash  . Tomato Rash    ROS Review of Systems    Objective:    Physical Exam  There were no vitals taken for this visit. Wt Readings from Last 3 Encounters:  10/29/20 193 lb 8 oz (87.8 kg)  09/17/20 190 lb 9.6 oz (86.5 kg)  09/03/20 193 lb 3.2 oz (87.6 kg)     Health Maintenance Due  Topic Date Due  . COVID-19 Vaccine (1) Never done  . OPHTHALMOLOGY EXAM  Never done  . HIV Screening  Never done  . COLONOSCOPY (Pts 45-45yr Insurance coverage will need to be confirmed)  Never done  . PAP SMEAR-Modifier  04/20/2020  . HEMOGLOBIN A1C  10/22/2020    There are no preventive care reminders to display for this patient.  Lab Results  Component Value Date   TSH 1.860 10/29/2020   Lab Results  Component Value Date   WBC 6.8 09/03/2020   HGB 15.3 09/03/2020   HCT 47.1 (H) 09/03/2020   MCV 89 09/03/2020   PLT 205 09/03/2020   Lab Results  Component Value Date   NA 137 09/03/2020   K 4.1 09/03/2020   CO2 23 09/03/2020   GLUCOSE 75 09/03/2020   BUN 8 09/03/2020   CREATININE 0.80 09/03/2020   BILITOT <0.2 09/03/2020   ALKPHOS 72  09/03/2020   AST 43 (H) 09/03/2020   ALT 35 (H) 09/03/2020   PROT 7.0 09/03/2020   ALBUMIN 4.2 09/03/2020   CALCIUM 9.1 09/03/2020   EGFR 86 09/03/2020  Lab Results  Component Value Date   CHOL 202 (H) 09/03/2020   Lab Results  Component Value Date   HDL 44 09/03/2020   Lab Results  Component Value Date   LDLCALC 111 (H) 09/03/2020   Lab Results  Component Value Date   TRIG 273 (H) 09/03/2020   Lab Results  Component Value Date   CHOLHDL 4.6 (H) 09/03/2020   Lab Results  Component Value Date   HGBA1C 6.4 (H) 04/23/2020      Assessment & Plan:   Problem List Items Addressed This Visit      Cardiovascular and Mediastinum   Essential hypertension     Respiratory   Sleep apnea - Primary     Endocrine   Hypothyroidism     Other   Prediabetes      No orders of the defined types were placed in this encounter. 1. Sleep apnea, unspecified type  Ongoing evalution, neurlogists wants her referred to pulmonary for evaluation for COPD and O2 therapy. Pt has charity Care, will try to get her an appt at [facility].  2. 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  3. Essential hypertension Not quite at target, currently ongoing evaluation for sleep apnea at [facility] 2. Possible need for supplementary O2.   4. Hypothyroidism, unspecified type Lab suggest she is at therapeutic levels  5. Hyperlipidemia Recently switched from Crestor to Pravastatin by cardiologist.   Follow-up: No follow-ups on file.     Charyl Bigger

## 2020-11-20 ENCOUNTER — Other Ambulatory Visit: Payer: Self-pay

## 2020-11-20 MED FILL — Albuterol Sulfate Inhal Aero 108 MCG/ACT (90MCG Base Equiv): RESPIRATORY_TRACT | 75 days supply | Qty: 25.5 | Fill #0 | Status: AC

## 2020-11-21 ENCOUNTER — Other Ambulatory Visit: Payer: Self-pay

## 2020-12-03 ENCOUNTER — Other Ambulatory Visit: Payer: Self-pay

## 2020-12-08 ENCOUNTER — Other Ambulatory Visit: Payer: Self-pay

## 2020-12-08 MED ORDER — ADVAIR DISKUS 100-50 MCG/ACT IN AEPB
INHALATION_SPRAY | RESPIRATORY_TRACT | 3 refills | Status: DC
  Filled 2020-12-25: qty 180, 90d supply, fill #0
  Filled 2021-05-12: qty 180, 90d supply, fill #1

## 2020-12-11 ENCOUNTER — Other Ambulatory Visit: Payer: Self-pay

## 2020-12-11 ENCOUNTER — Other Ambulatory Visit: Payer: Self-pay | Admitting: Gerontology

## 2020-12-11 MED FILL — Lansoprazole Cap Delayed Release 30 MG: ORAL | 90 days supply | Qty: 90 | Fill #0 | Status: AC

## 2020-12-15 ENCOUNTER — Other Ambulatory Visit: Payer: Self-pay | Admitting: Gerontology

## 2020-12-15 ENCOUNTER — Other Ambulatory Visit: Payer: Self-pay

## 2020-12-16 ENCOUNTER — Other Ambulatory Visit: Payer: Self-pay

## 2020-12-16 MED FILL — Lansoprazole Cap Delayed Release 30 MG: ORAL | Qty: 90 | Fill #0 | Status: CN

## 2020-12-17 ENCOUNTER — Other Ambulatory Visit: Payer: Self-pay

## 2020-12-25 ENCOUNTER — Other Ambulatory Visit: Payer: Self-pay

## 2020-12-26 ENCOUNTER — Other Ambulatory Visit: Payer: Self-pay

## 2020-12-31 ENCOUNTER — Other Ambulatory Visit: Payer: Self-pay

## 2020-12-31 MED FILL — Levothyroxine Sodium Tab 175 MCG: ORAL | 90 days supply | Qty: 90 | Fill #1 | Status: AC

## 2021-01-01 ENCOUNTER — Other Ambulatory Visit: Payer: Self-pay

## 2021-01-06 ENCOUNTER — Encounter: Payer: Self-pay | Admitting: Internal Medicine

## 2021-01-23 ENCOUNTER — Other Ambulatory Visit: Payer: Self-pay

## 2021-01-26 ENCOUNTER — Other Ambulatory Visit: Payer: Self-pay

## 2021-01-26 MED FILL — Pravastatin Sodium Tab 40 MG: ORAL | 30 days supply | Qty: 30 | Fill #0 | Status: AC

## 2021-01-26 MED FILL — Atenolol Tab 50 MG: ORAL | 90 days supply | Qty: 90 | Fill #0 | Status: AC

## 2021-01-27 ENCOUNTER — Telehealth: Payer: Self-pay | Admitting: Pharmacist

## 2021-01-27 NOTE — Telephone Encounter (Signed)
01/27/2021 8:41:08 AM - ProAir refill faxed to [facility]  -- [name] - Tuesday, January 27, 2021 8:40 AM --Arneta Cliche [facility] refill request for ProAir.

## 2021-01-28 ENCOUNTER — Other Ambulatory Visit: Payer: Self-pay

## 2021-01-28 ENCOUNTER — Ambulatory Visit: Payer: Self-pay | Admitting: Internal Medicine

## 2021-01-28 ENCOUNTER — Encounter: Payer: Self-pay | Admitting: Internal Medicine

## 2021-01-28 DIAGNOSIS — M7989 Other specified soft tissue disorders: Secondary | ICD-10-CM

## 2021-01-28 DIAGNOSIS — I10 Essential (primary) hypertension: Secondary | ICD-10-CM

## 2021-01-28 MED ORDER — TRIAMCINOLONE ACETONIDE 0.1 % EX CREA
1.0000 "application " | TOPICAL_CREAM | Freq: Two times a day (BID) | CUTANEOUS | 1 refills | Status: DC
  Filled 2021-01-28: qty 30, 15d supply, fill #0

## 2021-01-28 NOTE — Progress Notes (Signed)
Established Patient Office Visit  Subjective:  Patient ID: Lindsey Benjamin, female    DOB: June 30, 1966  Age: 55 y.o. MRN: [id number]  CC:  Chief Complaint  Patient presents with   Leg Swelling    Swelling along both ankles and small blisters of left side . Pt reports this has happened before but not this bad, its usually only during the summer. Going on for about 2 months    HPI Lindsey Benjamin is a 55 y/o female who presents for examination of bilateral leg swelling and blisters around her ankles. Pt reports the swelling happens often in the summer when it is 'hot and humid' but that she has never experienced blisters before. Pt reports previous weight of 192lbs. Pt will be given steroid cream and instructed to restart her diuretic pills.   Past Medical History:  Diagnosis Date   Asthma    Hypertension    Hypothyroidism     Past Surgical History:  Procedure Laterality Date   CESAREAN SECTION     CHOLECYSTECTOMY      Family History  Problem Relation Age of Onset   Breast cancer Neg Hx     Social History   Socioeconomic History   Marital status: Married    Spouse name: Not on file   Number of children: Not on file   Years of education: Not on file   Highest education level: Not on file  Occupational History   Not on file  Tobacco Use   Smoking status: Every Day    Packs/day: 0.50    Years: 20.00    Pack years: 10.00    Types: Cigarettes   Smokeless tobacco: Never  Substance and Sexual Activity   Alcohol use: Yes    Alcohol/week: 0.0 - 1.0 standard drinks    Comment: occasionally   Drug use: No   Sexual activity: Not on file  Other Topics Concern   Not on file  Social History Narrative   Is on food stamps but they will be cut in half starting next month. She is not worried about this        lives in boarding house      Pt said she is doing well and Is not currently interested in potential aid    Social Determinants of Health   Financial Resource  Strain: Not on file  Food Insecurity: Not on file  Transportation Needs: Not on file  Physical Activity: Not on file  Stress: Not on file  Social Connections: Not on file  Intimate Partner Violence: Not on file    Outpatient Medications Prior to Visit  Medication Sig Dispense Refill   acetaminophen (TYLENOL) 500 MG tablet Take 500 mg by mouth every 6 (six) hours as needed.     ADVAIR DISKUS 100-50 MCG/DOSE AEPB INHALE 1 PUFF BY MOUTH EVERY 12 HOURS 180 each 3   albuterol (VENTOLIN HFA) 108 (90 Base) MCG/ACT inhaler Inhale into the lungs.     atenolol (TENORMIN) 25 MG tablet TAKE ONE TABLET BY MOUTH EVERY DAY 90 tablet 0   atenolol (TENORMIN) 50 MG tablet TAKE ONE TABLET BY MOUTH EVERY DAY 90 tablet 3   cetirizine (ZYRTEC) 10 MG tablet TAKE ONE TABLET BY MOUTH EVERY DAY 90 tablet 1   DULoxetine (CYMBALTA) 30 MG capsule TAKE ONE CAPSULE BY MOUTH EVERY DAY 30 capsule 2   DULoxetine (CYMBALTA) 30 MG capsule TAKE ONE CAPSULE (30MG TOTAL) BY MOUTH TWICE DAILY. 60 capsule 2   FLUoxetine (PROZAC)  10 MG capsule Take 10 mg by mouth.     Fluticasone Propionate (FLONASE NA) OTC     ADVAIR DISKUS 100-50 MCG/ACT AEPB INHALE ONE PUFF INTO THE LUNGS EVERY 12 HOURS. 180 each 3   Fluticasone-Salmeterol (ADVAIR) 100-50 MCG/DOSE AEPB Inhale 1 puff into the lungs 2 (two) times daily. Reported on 10/22/2015     gabapentin (NEURONTIN) 600 MG tablet TAKE ONE-HALF (1/2) TABLET (300MG TOTAL) BY MOUTH ONCE DAILY AT BEDTIME. 30 tablet 2   hydrochlorothiazide (HYDRODIURIL) 25 MG tablet Take 1 tablet (25 mg total) by mouth daily. (Patient taking differently: Take 25 mg by mouth daily. As needed) 90 tablet 3   hydrochlorothiazide (MICROZIDE) 12.5 MG capsule TAKE TWO CAPSULES BY MOUTH EVERY DAY 180 capsule 0   lansoprazole (PREVACID) 30 MG capsule TAKE ONE CAPSULE BY MOUTH EVERY DAY AT NOON 90 capsule 3   levothyroxine (SYNTHROID) 175 MCG tablet TAKE ONE TABLET BY MOUTH EVERY DAY BEFORE BREAKFAST. 90 tablet 3   losartan  (COZAAR) 100 MG tablet Take 1 tablet (100 mg total) by mouth daily. 90 tablet 3   losartan (COZAAR) 50 MG tablet TAKE TWO TABLETS BY MOUTH EVERY DAY 180 tablet 3   NEOMYCIN-POLYMYXIN-HYDROCORTISONE (CORTISPORIN) 1 % SOLN OTIC solution PLACE 4 DROPS INTO THE RIGHT EAR 2 TIMES A DAY FOR 14 DAYS 10 mL 1   pravastatin (PRAVACHOL) 40 MG tablet TAKE ONE TABLET BY MOUTH EVERY DAY 30 tablet 11   PROAIR HFA 108 (90 Base) MCG/ACT inhaler INHALE 2 PUFFS FOUR TIMES A DAY. REPLACES VENTOLIN. 34 g 3   VENTOLIN HFA 108 (90 Base) MCG/ACT inhaler INHALE 2 PUFFS BY MOUTH FOUR TIMES A DAY 54 g 3   No facility-administered medications prior to visit.    Allergies  Allergen Reactions   Ofloxacin Swelling   Penicillins Anaphylaxis   Ranitidine Hives   Lisinopril Cough   Ceclor [Cefaclor] Hives   Zantac [Ranitidine Hcl]     hives   Levaquin [Levofloxacin In D5w] Rash    Peel her skin   Levofloxacin Other (See Comments) and Rash   Tomato Rash    ROS Review of Systems    Objective:    Physical Exam  BP (!) 169/88 (BP Location: Left Arm, Patient Position: Sitting, Cuff Size: Large)   Pulse 89   Temp (!) 96.4 F (35.8 C)   Ht '4\' 9"'  (1.448 m)   Wt 200 lb 4.8 oz (90.9 kg)   SpO2 93%   BMI 43.34 kg/m  Wt Readings from Last 3 Encounters:  01/28/21 200 lb 4.8 oz (90.9 kg)  10/29/20 193 lb 8 oz (87.8 kg)  09/17/20 190 lb 9.6 oz (86.5 kg)     Health Maintenance Due  Topic Date Due   COVID-19 Vaccine (1) Never done   OPHTHALMOLOGY EXAM  Never done   HIV Screening  Never done   Zoster Vaccines- Shingrix (1 of 2) Never done   COLONOSCOPY (Pts 45-27yr Insurance coverage will need to be confirmed)  Never done   Pneumococcal Vaccine 07672Years old (2 - PCV) 04/10/2020   PAP SMEAR-Modifier  04/20/2020   HEMOGLOBIN A1C  10/22/2020    There are no preventive care reminders to display for this patient.  Lab Results  Component Value Date   TSH 1.860 10/29/2020   Lab Results  Component Value  Date   WBC 6.8 09/03/2020   HGB 15.3 09/03/2020   HCT 47.1 (H) 09/03/2020   MCV 89 09/03/2020   PLT 205 09/03/2020  Lab Results  Component Value Date   NA 137 09/03/2020   K 4.1 09/03/2020   CO2 23 09/03/2020   GLUCOSE 75 09/03/2020   BUN 8 09/03/2020   CREATININE 0.80 09/03/2020   BILITOT <0.2 09/03/2020   ALKPHOS 72 09/03/2020   AST 43 (H) 09/03/2020   ALT 35 (H) 09/03/2020   PROT 7.0 09/03/2020   ALBUMIN 4.2 09/03/2020   CALCIUM 9.1 09/03/2020   EGFR 86 09/03/2020   Lab Results  Component Value Date   CHOL 202 (H) 09/03/2020   Lab Results  Component Value Date   HDL 44 09/03/2020   Lab Results  Component Value Date   LDLCALC 111 (H) 09/03/2020   Lab Results  Component Value Date   TRIG 273 (H) 09/03/2020   Lab Results  Component Value Date   CHOLHDL 4.6 (H) 09/03/2020   Lab Results  Component Value Date   HGBA1C 6.4 (H) 04/23/2020      Assessment & Plan:  1. Leg swelling In the clinical chief complaint patient informs [place] she stopped her hydrochlorothiazide because she thought it was cause a rash and blisters on her lower legs. She has no known history of allergy to sulfur drugs and the rash was localized to the lower legs. History of chronic intermittent lower extremity edema. Recent apparent negative cardiac catheterization for significant CAD. Known history of sleep apnea and hypertension. The lower extremities demonstrate some pitting edema and a few 1 by 1 sonometer ruptured bullia localized to the left ankle area only. Has strong good bilateral pulses. No clinical evidence phlebitis. Plan is to check her blood chemistry today, restarts hydrochlorothiazide 12.73m capsul daily instead of twice a day. Apply Thi cream to lower extremity twice a day and recheck in approximately two weeks. Encouraged her to keep legs elevated as much as possible and limit friction from clothing on her lower legs.      Problem List Items Addressed This Visit   None   No  orders of the defined types were placed in this encounter.   Follow-up: No follow-ups on file.    GDede Query CMA

## 2021-01-29 LAB — COMPREHENSIVE METABOLIC PANEL
ALT: 54 IU/L — ABNORMAL HIGH (ref 0–32)
AST: 50 IU/L — ABNORMAL HIGH (ref 0–40)
Albumin/Globulin Ratio: 1.4 (ref 1.2–2.2)
Albumin: 4.4 g/dL (ref 3.8–4.9)
Alkaline Phosphatase: 101 IU/L (ref 44–121)
BUN/Creatinine Ratio: 11 (ref 9–23)
BUN: 10 mg/dL (ref 6–24)
Bilirubin Total: 0.4 mg/dL (ref 0.0–1.2)
CO2: 27 mmol/L (ref 20–29)
Calcium: 9.5 mg/dL (ref 8.7–10.2)
Chloride: 96 mmol/L (ref 96–106)
Creatinine, Ser: 0.91 mg/dL (ref 0.57–1.00)
Globulin, Total: 3.1 g/dL (ref 1.5–4.5)
Glucose: 130 mg/dL — ABNORMAL HIGH (ref 65–99)
Potassium: 4.2 mmol/L (ref 3.5–5.2)
Sodium: 140 mmol/L (ref 134–144)
Total Protein: 7.5 g/dL (ref 6.0–8.5)
eGFR: 74 mL/min/{1.73_m2} (ref >59)

## 2021-01-29 LAB — MICROSCOPIC EXAMINATION
Casts: NONE SEEN /lpf
Epithelial Cells (non renal): >10 /hpf — AB (ref 0–10)

## 2021-01-29 LAB — URINALYSIS, ROUTINE W REFLEX MICROSCOPIC
Bilirubin, UA: NEGATIVE
Glucose, UA: NEGATIVE
Ketones, UA: NEGATIVE
Leukocytes,UA: NEGATIVE
Nitrite, UA: NEGATIVE
Specific Gravity, UA: 1.011 (ref 1.005–1.030)
Urobilinogen, Ur: 0.2 mg/dL (ref 0.2–1.0)
pH, UA: 5.5 (ref 5.0–7.5)

## 2021-01-29 LAB — CBC
Hematocrit: 45.7 % (ref 34.0–46.6)
Hemoglobin: 14.7 g/dL (ref 11.1–15.9)
MCH: 27.9 pg (ref 26.6–33.0)
MCHC: 32.2 g/dL (ref 31.5–35.7)
MCV: 87 fL (ref 79–97)
Platelets: 218 10*3/uL (ref 150–450)
RBC: 5.26 x10E6/uL (ref 3.77–5.28)
RDW: 13.8 % (ref 11.7–15.4)
WBC: 7.8 10*3/uL (ref 3.4–10.8)

## 2021-01-29 LAB — HEMOGLOBIN A1C
Est. average glucose Bld gHb Est-mCnc: 146 mg/dL
Hgb A1c MFr Bld: 6.7 % — ABNORMAL HIGH (ref 4.8–5.6)

## 2021-01-29 LAB — TSH: TSH: 12 u[IU]/mL — ABNORMAL HIGH (ref 0.450–4.500)

## 2021-01-29 NOTE — Telephone Encounter (Signed)
Called to let patient know that I have faxed the referral and notes to [facility]. Left voice mail re: above.

## 2021-02-04 ENCOUNTER — Other Ambulatory Visit: Payer: Self-pay

## 2021-02-05 ENCOUNTER — Other Ambulatory Visit: Payer: Self-pay

## 2021-02-10 ENCOUNTER — Other Ambulatory Visit: Payer: Self-pay

## 2021-02-10 MED ORDER — GABAPENTIN 600 MG PO TABS
ORAL_TABLET | ORAL | 3 refills | Status: DC
  Filled 2021-02-10: qty 30, 30d supply, fill #0
  Filled 2021-03-20 – 2021-03-25 (×2): qty 15, 30d supply, fill #0
  Filled 2021-04-28: qty 15, 30d supply, fill #1

## 2021-02-10 MED ORDER — DULOXETINE HCL 30 MG PO CPEP
ORAL_CAPSULE | Freq: Two times a day (BID) | ORAL | 3 refills | Status: DC
  Filled 2021-03-20 – 2021-03-25 (×2): qty 60, 30d supply, fill #0
  Filled 2021-04-28: qty 60, 30d supply, fill #1

## 2021-02-11 ENCOUNTER — Ambulatory Visit: Payer: Self-pay | Admitting: Internal Medicine

## 2021-02-11 DIAGNOSIS — M7989 Other specified soft tissue disorders: Secondary | ICD-10-CM

## 2021-02-11 DIAGNOSIS — E039 Hypothyroidism, unspecified: Secondary | ICD-10-CM

## 2021-02-11 NOTE — Progress Notes (Signed)
 Established Patient Office Visit  Subjective:  Patient ID: Lindsey Benjamin, female    DOB: 09/28/1965  Age: 55 y.o. MRN: [id number]  CC:  Chief Complaint  Patient presents with   Follow-up    Follow-up for leg swelling, blisters, and abnormal thyroid test.    HPI Lindsey Benjamin is a 55 y/o female who presents for follow-up of leg swelling after previous office visit. Patient reports that her blisters are healing. After the last visit the patient started a diuretic that seems to be helping. Patient reports taking her thyroid medication most days, however sometimes she oversleeps and does not take the medication. Pt was advised to take the medication right after she wakes up even if she oversleeps.   Past Medical History:  Diagnosis Date   Asthma    Hypertension    Hypothyroidism     Past Surgical History:  Procedure Laterality Date   CESAREAN SECTION     CHOLECYSTECTOMY      Family History  Problem Relation Age of Onset   Breast cancer Neg Hx     Social History   Socioeconomic History   Marital status: Married    Spouse name: Not on file   Number of children: Not on file   Years of education: Not on file   Highest education level: Not on file  Occupational History   Not on file  Tobacco Use   Smoking status: Every Day    Packs/day: 0.50    Years: 20.00    Pack years: 10.00    Types: Cigarettes   Smokeless tobacco: Never  Substance and Sexual Activity   Alcohol use: Yes    Alcohol/week: 0.0 - 1.0 standard drinks    Comment: occasionally   Drug use: No   Sexual activity: Not on file  Other Topics Concern   Not on file  Social History Narrative   Is on food stamps but they will be cut in half starting next month. She is not worried about this        lives in boarding house      Pt said she is doing well and Is not currently interested in potential aid    Social Determinants of Health   Financial Resource Strain: Not on file  Food Insecurity: Not  on file  Transportation Needs: Not on file  Physical Activity: Not on file  Stress: Not on file  Social Connections: Not on file  Intimate Partner Violence: Not on file    Outpatient Medications Prior to Visit  Medication Sig Dispense Refill   acetaminophen (TYLENOL) 500 MG tablet Take 500 mg by mouth every 6 (six) hours as needed.     ADVAIR DISKUS 100-50 MCG/DOSE AEPB INHALE 1 PUFF BY MOUTH EVERY 12 HOURS 180 each 3   albuterol (VENTOLIN HFA) 108 (90 Base) MCG/ACT inhaler Inhale into the lungs.     atenolol (TENORMIN) 25 MG tablet TAKE ONE TABLET BY MOUTH EVERY DAY 90 tablet 0   atenolol (TENORMIN) 50 MG tablet TAKE ONE TABLET BY MOUTH EVERY DAY 90 tablet 3   cetirizine (ZYRTEC) 10 MG tablet TAKE ONE TABLET BY MOUTH EVERY DAY 90 tablet 1   DULoxetine (CYMBALTA) 30 MG capsule TAKE ONE CAPSULE BY MOUTH TWICE DAILY. 60 capsule 3   FLUoxetine (PROZAC) 10 MG capsule Take 10 mg by mouth.     Fluticasone Propionate (FLONASE NA) OTC     ADVAIR DISKUS 100-50 MCG/ACT AEPB INHALE ONE PUFF INTO THE LUNGS EVERY   12 HOURS. 180 each 3   Fluticasone-Salmeterol (ADVAIR) 100-50 MCG/DOSE AEPB Inhale 1 puff into the lungs 2 (two) times daily. Reported on 10/22/2015     gabapentin (NEURONTIN) 600 MG tablet TAKE (1/2) TABLET (300MG TOTAL) BY MOUTH ONCE DAILY AT BEDTIME. 30 tablet 3   hydrochlorothiazide (HYDRODIURIL) 25 MG tablet Take 1 tablet (25 mg total) by mouth daily. 90 tablet 3   hydrochlorothiazide (MICROZIDE) 12.5 MG capsule TAKE TWO CAPSULES BY MOUTH EVERY DAY 180 capsule 0   lansoprazole (PREVACID) 30 MG capsule TAKE ONE CAPSULE BY MOUTH EVERY DAY AT NOON 90 capsule 3   levothyroxine (SYNTHROID) 175 MCG tablet TAKE ONE TABLET BY MOUTH EVERY DAY BEFORE BREAKFAST. 90 tablet 3   losartan (COZAAR) 100 MG tablet Take 1 tablet (100 mg total) by mouth daily. 90 tablet 3   losartan (COZAAR) 50 MG tablet TAKE TWO TABLETS BY MOUTH EVERY DAY 180 tablet 3   NEOMYCIN-POLYMYXIN-HYDROCORTISONE (CORTISPORIN) 1 %  SOLN OTIC solution PLACE 4 DROPS INTO THE RIGHT EAR 2 TIMES A DAY FOR 14 DAYS 10 mL 1   pravastatin (PRAVACHOL) 40 MG tablet TAKE ONE TABLET BY MOUTH EVERY DAY 30 tablet 11   PROAIR HFA 108 (90 Base) MCG/ACT inhaler INHALE 2 PUFFS FOUR TIMES A DAY. REPLACES VENTOLIN. 34 g 3   triamcinolone cream (KENALOG) 0.1 % Apply 1 application topically 2 (two) times daily. 30 g 1   VENTOLIN HFA 108 (90 Base) MCG/ACT inhaler INHALE 2 PUFFS BY MOUTH FOUR TIMES A DAY 54 g 3   No facility-administered medications prior to visit.    Allergies  Allergen Reactions   Ofloxacin Swelling   Penicillins Anaphylaxis   Ranitidine Hives   Lisinopril Cough   Ceclor [Cefaclor] Hives   Zantac [Ranitidine Hcl]     hives   Levaquin [Levofloxacin In D5w] Rash    Peel her skin   Levofloxacin Other (See Comments) and Rash   Tomato Rash    ROS Review of Systems    Objective:    Physical Exam  There were no vitals taken for this visit. Wt Readings from Last 3 Encounters:  01/28/21 200 lb 4.8 oz (90.9 kg)  10/29/20 193 lb 8 oz (87.8 kg)  09/17/20 190 lb 9.6 oz (86.5 kg)     Health Maintenance Due  Topic Date Due   COVID-19 Vaccine (1) Never done   OPHTHALMOLOGY EXAM  Never done   HIV Screening  Never done   Zoster Vaccines- Shingrix (1 of 2) Never done   COLONOSCOPY (Pts 45-49yrs Insurance coverage will need to be confirmed)  Never done   Pneumococcal Vaccine 0-64 Years old (2 - PCV) 04/10/2020   PAP SMEAR-Modifier  04/20/2020   INFLUENZA VACCINE  02/02/2021    There are no preventive care reminders to display for this patient.  Lab Results  Component Value Date   TSH 12.000 (H) 01/28/2021   Lab Results  Component Value Date   WBC 7.8 01/28/2021   HGB 14.7 01/28/2021   HCT 45.7 01/28/2021   MCV 87 01/28/2021   PLT 218 01/28/2021   Lab Results  Component Value Date   NA 140 01/28/2021   K 4.2 01/28/2021   CO2 27 01/28/2021   GLUCOSE 130 (H) 01/28/2021   BUN 10 01/28/2021    CREATININE 0.91 01/28/2021   BILITOT 0.4 01/28/2021   ALKPHOS 101 01/28/2021   AST 50 (H) 01/28/2021   ALT 54 (H) 01/28/2021   PROT 7.5 01/28/2021   ALBUMIN 4.4 01/28/2021     CALCIUM 9.5 01/28/2021   EGFR 74 01/28/2021   Lab Results  Component Value Date   CHOL 202 (H) 09/03/2020   Lab Results  Component Value Date   HDL 44 09/03/2020   Lab Results  Component Value Date   LDLCALC 111 (H) 09/03/2020   Lab Results  Component Value Date   TRIG 273 (H) 09/03/2020   Lab Results  Component Value Date   CHOLHDL 4.6 (H) 09/03/2020   Lab Results  Component Value Date   HGBA1C 6.7 (H) 01/28/2021      Assessment & Plan:   1. Hypothyroidism, unspecified type Lab TSH is elevated. Patient says she does not take her thyroid medication if she sleeps late as she is afraid it may interact with the other medications she takes at lunch. Cautioned her to be sure to take this medication daily even if it is not first thing in the morning and it is not expected to interact with her other medications even though it may not be significantly absorbed to its maximum extent. Plan to recheck lab levels in several weeks to check on compliance. It is noted that her a1c is 6.7 slightly higher. Cautioned her on the importance of weight reduction. Will recheck in several weeks.  2. Leg swelling Patient unable to keep her in-person appointment. Telephone conversation says the edema is greatly improved since restarting the diuretic and the blisters and rash have greatly improved. Will plan to see her in a couple of weeks for visualization and to check her weight to see if there has been any reduction due to the diuretic.    Follow-up: No follow-ups on file.     K , CMA 

## 2021-02-20 ENCOUNTER — Telehealth: Payer: Self-pay | Admitting: Pharmacist

## 2021-02-20 NOTE — Telephone Encounter (Signed)
02/20/2021 4:10:33 PM - Advair renewal to pt & script to provider  -- [name] - Friday, February 20, 2021 4:09 PM --Mailing [employer] form to patient to sign for renewal on Advair 100/50, and sending script to [facility] for Lanora Manis to sign & return.

## 2021-02-25 ENCOUNTER — Other Ambulatory Visit: Payer: Self-pay

## 2021-02-25 DIAGNOSIS — E039 Hypothyroidism, unspecified: Secondary | ICD-10-CM

## 2021-02-26 ENCOUNTER — Telehealth: Payer: Self-pay | Admitting: Pharmacist

## 2021-02-26 LAB — TSH: TSH: 2.3 u[IU]/mL (ref 0.450–4.500)

## 2021-02-26 LAB — HEMOGLOBIN A1C
Est. average glucose Bld gHb Est-mCnc: 154 mg/dL
Hgb A1c MFr Bld: 7 % — ABNORMAL HIGH (ref 4.8–5.6)

## 2021-02-26 LAB — GLUCOSE, RANDOM: Glucose: 155 mg/dL — ABNORMAL HIGH (ref 65–99)

## 2021-02-26 LAB — T4: T4, Total: 12.8 ug/dL — ABNORMAL HIGH (ref 4.5–12.0)

## 2021-02-26 NOTE — Telephone Encounter (Signed)
02/26/2021 12:23:47 PM - Advair pending  -- Lindsey Benjamin - Thursday, February 26, 2021 12:22 PM --I have received the signed script back from provider for Advair, holding for patient to return her portion, just mailed to patient 02/23/2021.

## 2021-03-04 ENCOUNTER — Other Ambulatory Visit: Payer: Self-pay

## 2021-03-04 ENCOUNTER — Ambulatory Visit: Payer: Self-pay | Admitting: Internal Medicine

## 2021-03-04 ENCOUNTER — Encounter: Payer: Self-pay | Admitting: Internal Medicine

## 2021-03-04 VITALS — Temp 96.9°F | Wt 204.5 lb

## 2021-03-04 DIAGNOSIS — I10 Essential (primary) hypertension: Secondary | ICD-10-CM

## 2021-03-04 DIAGNOSIS — G4730 Sleep apnea, unspecified: Secondary | ICD-10-CM

## 2021-03-04 DIAGNOSIS — R7303 Prediabetes: Secondary | ICD-10-CM

## 2021-03-04 DIAGNOSIS — M7989 Other specified soft tissue disorders: Secondary | ICD-10-CM

## 2021-03-04 DIAGNOSIS — E039 Hypothyroidism, unspecified: Secondary | ICD-10-CM

## 2021-03-04 DIAGNOSIS — E119 Type 2 diabetes mellitus without complications: Secondary | ICD-10-CM

## 2021-03-04 HISTORY — DX: Type 2 diabetes mellitus without complications: E11.9

## 2021-03-04 NOTE — Progress Notes (Signed)
Established Patient Office Visit  Subjective:  Patient ID: Lindsey Benjamin, female    DOB: [birth year]  Age: 55 y.o. MRN: [id number]  CC:  Chief Complaint  Patient presents with   Follow-up    Follow up for blisters as seen two weeks ago. Pt reports they are better but there are some new clusters    HPI Lindsey Benjamin is a 55 y/o female who presents for recheck of blisters on her legs. She reports they are better (90%) but there are still some new clusters that bother her. Patients a1c was improved and her TSH levels had come back into acceptable range as she reports regularly taking her thyroid medication.  Past Medical History:  Diagnosis Date   Asthma    Hypertension    Hypothyroidism    Type 2 diabetes mellitus without complication, without long-term current use of insulin ([place]) 03/04/2021    Past Surgical History:  Procedure Laterality Date   CESAREAN SECTION     CHOLECYSTECTOMY      Family History  Problem Relation Age of Onset   Breast cancer Neg Hx     Social History   Socioeconomic History   Marital status: Married    Spouse name: Not on file   Number of children: Not on file   Years of education: Not on file   Highest education level: Not on file  Occupational History   Not on file  Tobacco Use   Smoking status: Every Day    Packs/day: 0.50    Years: 20.00    Pack years: 10.00    Types: Cigarettes   Smokeless tobacco: Never  Substance and Sexual Activity   Alcohol use: Yes    Alcohol/week: 0.0 - 1.0 standard drinks    Comment: occasionally   Drug use: No   Sexual activity: Not on file  Other Topics Concern   Not on file  Social History Narrative   Is on food stamps but they will be cut in half starting next month. She is not worried about this        lives in boarding house      Pt said she is doing well and Is not currently interested in potential aid    Social Determinants of Health   Financial Resource Strain: Not on file  Food  Insecurity: Not on file  Transportation Needs: Not on file  Physical Activity: Not on file  Stress: Not on file  Social Connections: Not on file  Intimate Partner Violence: Not on file    Outpatient Medications Prior to Visit  Medication Sig Dispense Refill   acetaminophen (TYLENOL) 500 MG tablet Take 500 mg by mouth every 6 (six) hours as needed.     albuterol (VENTOLIN HFA) 108 (90 Base) MCG/ACT inhaler Inhale into the lungs.     atenolol (TENORMIN) 50 MG tablet TAKE ONE TABLET BY MOUTH EVERY DAY 90 tablet 3   cetirizine (ZYRTEC) 10 MG tablet TAKE ONE TABLET BY MOUTH EVERY DAY 90 tablet 1   DULoxetine (CYMBALTA) 30 MG capsule TAKE ONE CAPSULE BY MOUTH TWICE DAILY. 60 capsule 3   gabapentin (NEURONTIN) 600 MG tablet TAKE (1/2) TABLET (300MG TOTAL) BY MOUTH ONCE DAILY AT BEDTIME. 30 tablet 3   hydrochlorothiazide (MICROZIDE) 12.5 MG capsule TAKE TWO CAPSULES BY MOUTH EVERY DAY 180 capsule 0   lansoprazole (PREVACID) 30 MG capsule TAKE ONE CAPSULE BY MOUTH EVERY DAY AT NOON 90 capsule 3   levothyroxine (SYNTHROID) 175 MCG tablet TAKE  ONE TABLET BY MOUTH EVERY DAY BEFORE BREAKFAST. 90 tablet 3   losartan (COZAAR) 50 MG tablet TAKE TWO TABLETS BY MOUTH EVERY DAY 180 tablet 3   ADVAIR DISKUS 100-50 MCG/ACT AEPB INHALE ONE PUFF INTO THE LUNGS EVERY 12 HOURS. (Patient not taking: Reported on 03/04/2021) 180 each 3   ADVAIR DISKUS 100-50 MCG/DOSE AEPB INHALE 1 PUFF BY MOUTH EVERY 12 HOURS (Patient not taking: Reported on 03/04/2021) 180 each 3   atenolol (TENORMIN) 25 MG tablet TAKE ONE TABLET BY MOUTH EVERY DAY 90 tablet 0   FLUoxetine (PROZAC) 10 MG capsule Take 10 mg by mouth. (Patient not taking: Reported on 03/04/2021)     Fluticasone Propionate (FLONASE NA) OTC (Patient not taking: Reported on 03/04/2021)     Fluticasone-Salmeterol (ADVAIR) 100-50 MCG/DOSE AEPB Inhale 1 puff into the lungs 2 (two) times daily. Reported on 10/22/2015 (Patient not taking: Reported on 03/04/2021)      hydrochlorothiazide (HYDRODIURIL) 25 MG tablet Take 1 tablet (25 mg total) by mouth daily. (Patient not taking: Reported on 03/04/2021) 90 tablet 3   losartan (COZAAR) 100 MG tablet Take 1 tablet (100 mg total) by mouth daily. (Patient not taking: Reported on 03/04/2021) 90 tablet 3   NEOMYCIN-POLYMYXIN-HYDROCORTISONE (CORTISPORIN) 1 % SOLN OTIC solution PLACE 4 DROPS INTO THE RIGHT EAR 2 TIMES A DAY FOR 14 DAYS (Patient not taking: Reported on 03/04/2021) 10 mL 1   pravastatin (PRAVACHOL) 40 MG tablet TAKE ONE TABLET BY MOUTH EVERY DAY 30 tablet 11   PROAIR HFA 108 (90 Base) MCG/ACT inhaler INHALE 2 PUFFS FOUR TIMES A DAY. REPLACES VENTOLIN. (Patient not taking: Reported on 03/04/2021) 34 g 3   triamcinolone cream (KENALOG) 0.1 % Apply 1 application topically 2 (two) times daily. (Patient not taking: Reported on 03/04/2021) 30 g 1   VENTOLIN HFA 108 (90 Base) MCG/ACT inhaler INHALE 2 PUFFS BY MOUTH FOUR TIMES A DAY (Patient not taking: Reported on 03/04/2021) 54 g 3   No facility-administered medications prior to visit.    Allergies  Allergen Reactions   Ofloxacin Swelling   Penicillins Anaphylaxis   Ranitidine Hives   Lisinopril Cough   Ceclor [Cefaclor] Hives   Zantac [Ranitidine Hcl]     hives   Levaquin [Levofloxacin In D5w] Rash    Peel her skin   Levofloxacin Other (See Comments) and Rash   Tomato Rash    ROS Review of Systems    Objective:    Physical Exam  Temp (!) 96.9 F (36.1 C)   Wt 204 lb 8 oz (92.8 kg)   BMI 43.87 kg/m  Wt Readings from Last 3 Encounters:  03/04/21 204 lb 8 oz (92.8 kg)  02/25/21 207 lb 12.8 oz (94.3 kg)  01/28/21 200 lb 4.8 oz (90.9 kg)     Health Maintenance Due  Topic Date Due   COVID-19 Vaccine (1) Never done   OPHTHALMOLOGY EXAM  Never done   HIV Screening  Never done   Zoster Vaccines- Shingrix (1 of 2) Never done   COLONOSCOPY (Pts 45-21yr Insurance coverage will need to be confirmed)  Never done   Pneumococcal Vaccine 0-64 Years  old (2 - PCV) 04/10/2020   PAP SMEAR-Modifier  04/20/2020   INFLUENZA VACCINE  02/02/2021    There are no preventive care reminders to display for this patient.  Lab Results  Component Value Date   TSH 2.300 02/25/2021   Lab Results  Component Value Date   WBC 7.8 01/28/2021   HGB 14.7 01/28/2021  HCT 45.7 01/28/2021   MCV 87 01/28/2021   PLT 218 01/28/2021   Lab Results  Component Value Date   NA 140 01/28/2021   K 4.2 01/28/2021   CO2 27 01/28/2021   GLUCOSE 155 (H) 02/25/2021   BUN 10 01/28/2021   CREATININE 0.91 01/28/2021   BILITOT 0.4 01/28/2021   ALKPHOS 101 01/28/2021   AST 50 (H) 01/28/2021   ALT 54 (H) 01/28/2021   PROT 7.5 01/28/2021   ALBUMIN 4.4 01/28/2021   CALCIUM 9.5 01/28/2021   EGFR 74 01/28/2021   Lab Results  Component Value Date   CHOL 202 (H) 09/03/2020   Lab Results  Component Value Date   HDL 44 09/03/2020   Lab Results  Component Value Date   LDLCALC 111 (H) 09/03/2020   Lab Results  Component Value Date   TRIG 273 (H) 09/03/2020   Lab Results  Component Value Date   CHOLHDL 4.6 (H) 09/03/2020   Lab Results  Component Value Date   HGBA1C 7.0 (H) 02/25/2021      Assessment & Plan:   Problem List Items Addressed This Visit       Cardiovascular and Mediastinum   Essential hypertension     Respiratory   Sleep apnea     Endocrine   Hypothyroidism - Primary   Type 2 diabetes mellitus without complication, without long-term current use of insulin ([facility])     Other   Leg swelling   RESOLVED: Prediabetes    No orders of the defined types were placed in this encounter.  1. Essential hypertension Close to target still elevation in systolic reading. She has a lot of difficulty tolerating BP cuff to get accurate readings. Has a cardiology follow-up at [facility] shortly.   2. Hypothyroidism, unspecified type Appears to be taking her thyroid medication appropriately with normalization of her TSH level.  3. Type 2 diabetes  mellitus without complication J6E now up to 7. Clinically has diabetes by definition.Currently do not recognize any complications secondary to the diabetes. No therapeutic medications needed at this time. Stressed importance of weight loss and diet improvement.   4. Sleep Apnea Has a follow-up with her [facility] pulmonologist shortly. Some questions as to whether she needs supplementary oxygen at night to go with her CPAP machine.   5. Leg edema Clinically greatly improved. Now taking her diuretic appropriately. The skin rash/blisters have significantly improved. Has some dry skin but is not longer using her Cortizone cream.    Follow-up: Follow-up in 90 days with appropriate labs.   Dede Query, CMA

## 2021-03-05 ENCOUNTER — Other Ambulatory Visit: Payer: Self-pay

## 2021-03-06 ENCOUNTER — Telehealth: Payer: Self-pay | Admitting: Pharmacist

## 2021-03-06 NOTE — Telephone Encounter (Signed)
03/06/2021 11:05:11 AM - Advair renewal faxed to [employer]  -- [name] - Friday, March 06, 2021 11:04 AM --Faxed [employer] renewal for Advair.

## 2021-03-16 ENCOUNTER — Other Ambulatory Visit: Payer: Self-pay

## 2021-03-16 MED FILL — Albuterol Sulfate Inhal Aero 108 MCG/ACT (90MCG Base Equiv): RESPIRATORY_TRACT | 75 days supply | Qty: 25.5 | Fill #1 | Status: AC

## 2021-03-17 ENCOUNTER — Other Ambulatory Visit: Payer: Self-pay

## 2021-03-20 ENCOUNTER — Other Ambulatory Visit: Payer: Self-pay

## 2021-03-23 ENCOUNTER — Other Ambulatory Visit: Payer: Self-pay

## 2021-03-24 ENCOUNTER — Other Ambulatory Visit: Payer: Self-pay

## 2021-03-24 MED ORDER — SPIRONOLACTONE 25 MG PO TABS
25.0000 mg | ORAL_TABLET | Freq: Every day | ORAL | 6 refills | Status: DC
  Filled 2021-03-24: qty 30, 30d supply, fill #0
  Filled 2021-04-28: qty 30, 30d supply, fill #1
  Filled 2021-08-06: qty 30, 30d supply, fill #2
  Filled 2021-09-29: qty 30, 30d supply, fill #3
  Filled 2021-12-10: qty 30, 30d supply, fill #4
  Filled 2021-12-10: qty 30, 30d supply, fill #0

## 2021-03-24 MED ORDER — FUROSEMIDE 20 MG PO TABS
ORAL_TABLET | ORAL | 6 refills | Status: DC
  Filled 2021-03-24: qty 30, 30d supply, fill #0
  Filled 2021-08-06: qty 30, 30d supply, fill #1
  Filled 2021-09-29: qty 30, 30d supply, fill #2
  Filled 2021-12-10: qty 30, 30d supply, fill #0
  Filled 2021-12-10: qty 30, 30d supply, fill #3

## 2021-03-25 ENCOUNTER — Other Ambulatory Visit: Payer: Self-pay

## 2021-04-09 ENCOUNTER — Telehealth: Payer: Self-pay | Admitting: Pharmacist

## 2021-04-09 NOTE — Telephone Encounter (Signed)
--   [name] - Thursday, April 09, 2021 11:34 AM --Printed [employer] refill request for ProAir-put in [facility] folder for Dr. Candelaria Stagers to sign & return.

## 2021-04-10 ENCOUNTER — Telehealth: Payer: Self-pay | Admitting: Pharmacist

## 2021-04-10 NOTE — Telephone Encounter (Signed)
--   Lindsey Benjamin - Friday, April 10, 2021 9:19 AM --Received fax from [place], they need a Renewal application for this patient to continue to receive ProAir, I have printed and mailing patient her portion, putting provider portion in [facility] folder. Pulling out the Refill request that was put in [facility] folder 04/09/21.

## 2021-04-28 ENCOUNTER — Telehealth: Payer: Self-pay | Admitting: Pharmacist

## 2021-04-28 ENCOUNTER — Other Ambulatory Visit: Payer: Self-pay

## 2021-04-28 MED FILL — Lansoprazole Cap Delayed Release 30 MG: ORAL | 90 days supply | Qty: 90 | Fill #0 | Status: AC

## 2021-04-28 MED FILL — Levothyroxine Sodium Tab 175 MCG: ORAL | 90 days supply | Qty: 90 | Fill #2 | Status: CN

## 2021-04-28 MED FILL — Losartan Potassium Tab 50 MG: ORAL | 90 days supply | Qty: 180 | Fill #0 | Status: AC

## 2021-04-28 NOTE — Telephone Encounter (Signed)
--   [name] - Tuesday, April 28, 2021 12:07 PM --Faxed [employer] renewal for ProAir Inhale 2 puffs four times a day.

## 2021-04-29 ENCOUNTER — Other Ambulatory Visit: Payer: Self-pay

## 2021-05-01 ENCOUNTER — Other Ambulatory Visit: Payer: Self-pay

## 2021-05-01 ENCOUNTER — Other Ambulatory Visit: Payer: Self-pay | Admitting: Internal Medicine

## 2021-05-04 ENCOUNTER — Other Ambulatory Visit: Payer: Self-pay

## 2021-05-04 ENCOUNTER — Other Ambulatory Visit: Payer: Self-pay | Admitting: Gerontology

## 2021-05-06 ENCOUNTER — Other Ambulatory Visit: Payer: Self-pay

## 2021-05-06 MED ORDER — PROAIR HFA 108 (90 BASE) MCG/ACT IN AERS
INHALATION_SPRAY | RESPIRATORY_TRACT | 3 refills | Status: DC
  Filled 2021-05-06: qty 34, fill #0
  Filled 2021-06-22: qty 25.5, 75d supply, fill #0
  Filled 2021-10-22: qty 25.5, 75d supply, fill #1
  Filled 2022-02-02: qty 8.5, 25d supply, fill #0
  Filled 2022-04-28: qty 8.5, 25d supply, fill #1

## 2021-05-06 MED ORDER — ATENOLOL 50 MG PO TABS
ORAL_TABLET | Freq: Every day | ORAL | 3 refills | Status: DC
  Filled 2021-05-06: qty 90, 90d supply, fill #0
  Filled 2021-09-29: qty 90, 90d supply, fill #1
  Filled 2021-12-22: qty 90, 90d supply, fill #2
  Filled 2021-12-22: qty 90, 90d supply, fill #0
  Filled 2022-04-28: qty 90, 90d supply, fill #1

## 2021-05-06 MED ORDER — HYDROCHLOROTHIAZIDE 12.5 MG PO CAPS
ORAL_CAPSULE | Freq: Every day | ORAL | 0 refills | Status: DC
  Filled 2021-05-06: qty 180, 90d supply, fill #0

## 2021-05-06 MED FILL — Levothyroxine Sodium Tab 175 MCG: ORAL | 90 days supply | Qty: 90 | Fill #0 | Status: AC

## 2021-05-07 ENCOUNTER — Other Ambulatory Visit: Payer: Self-pay

## 2021-05-12 ENCOUNTER — Other Ambulatory Visit: Payer: Self-pay

## 2021-05-13 ENCOUNTER — Other Ambulatory Visit: Payer: Self-pay

## 2021-05-13 ENCOUNTER — Ambulatory Visit: Payer: Self-pay | Admitting: Internal Medicine

## 2021-05-20 ENCOUNTER — Ambulatory Visit: Payer: Self-pay | Admitting: Internal Medicine

## 2021-06-09 ENCOUNTER — Other Ambulatory Visit: Payer: Self-pay

## 2021-06-09 MED ORDER — DULOXETINE HCL 30 MG PO CPEP
ORAL_CAPSULE | ORAL | 1 refills | Status: DC
  Filled 2021-06-09: qty 60, 30d supply, fill #0
  Filled 2021-08-06: qty 60, 30d supply, fill #1

## 2021-06-09 MED ORDER — HYDROXYZINE HCL 10 MG PO TABS
ORAL_TABLET | ORAL | 1 refills | Status: DC
  Filled 2021-06-09: qty 60, 30d supply, fill #0

## 2021-06-09 MED ORDER — GABAPENTIN 600 MG PO TABS
ORAL_TABLET | ORAL | 0 refills | Status: DC
  Filled 2021-06-09: qty 15, 30d supply, fill #0

## 2021-06-10 ENCOUNTER — Other Ambulatory Visit: Payer: Self-pay

## 2021-06-10 DIAGNOSIS — E039 Hypothyroidism, unspecified: Secondary | ICD-10-CM

## 2021-06-10 DIAGNOSIS — I10 Essential (primary) hypertension: Secondary | ICD-10-CM

## 2021-06-10 DIAGNOSIS — R7303 Prediabetes: Secondary | ICD-10-CM

## 2021-06-11 LAB — CBC WITH DIFFERENTIAL/PLATELET
Basophils Absolute: 0.1 10*3/uL (ref 0.0–0.2)
Basos: 1 %
EOS (ABSOLUTE): 0.2 10*3/uL (ref 0.0–0.4)
Eos: 3 %
Hematocrit: 44.3 % (ref 34.0–46.6)
Hemoglobin: 15.2 g/dL (ref 11.1–15.9)
Immature Grans (Abs): 0 10*3/uL (ref 0.0–0.1)
Immature Granulocytes: 0 %
Lymphocytes Absolute: 2.6 10*3/uL (ref 0.7–3.1)
Lymphs: 40 %
MCH: 30.2 pg (ref 26.6–33.0)
MCHC: 34.3 g/dL (ref 31.5–35.7)
MCV: 88 fL (ref 79–97)
Monocytes Absolute: 0.7 10*3/uL (ref 0.1–0.9)
Monocytes: 11 %
Neutrophils Absolute: 3 10*3/uL (ref 1.4–7.0)
Neutrophils: 45 %
Platelets: 203 10*3/uL (ref 150–450)
RBC: 5.03 x10E6/uL (ref 3.77–5.28)
RDW: 13.6 % (ref 11.7–15.4)
WBC: 6.6 10*3/uL (ref 3.4–10.8)

## 2021-06-11 LAB — COMPREHENSIVE METABOLIC PANEL
ALT: 102 IU/L — ABNORMAL HIGH (ref 0–32)
AST: 65 IU/L — ABNORMAL HIGH (ref 0–40)
Albumin/Globulin Ratio: 1.5 (ref 1.2–2.2)
Albumin: 4.4 g/dL (ref 3.8–4.9)
Alkaline Phosphatase: 104 IU/L (ref 44–121)
BUN/Creatinine Ratio: 16 (ref 9–23)
BUN: 13 mg/dL (ref 6–24)
Bilirubin Total: 0.4 mg/dL (ref 0.0–1.2)
CO2: 27 mmol/L (ref 20–29)
Calcium: 9.6 mg/dL (ref 8.7–10.2)
Chloride: 97 mmol/L (ref 96–106)
Creatinine, Ser: 0.82 mg/dL (ref 0.57–1.00)
Globulin, Total: 2.9 g/dL (ref 1.5–4.5)
Glucose: 126 mg/dL — ABNORMAL HIGH (ref 70–99)
Potassium: 4.6 mmol/L (ref 3.5–5.2)
Sodium: 137 mmol/L (ref 134–144)
Total Protein: 7.3 g/dL (ref 6.0–8.5)
eGFR: 83 mL/min/{1.73_m2} (ref >59)

## 2021-06-11 LAB — LIPID PANEL
Chol/HDL Ratio: 5 ratio — ABNORMAL HIGH (ref 0.0–4.4)
Cholesterol, Total: 195 mg/dL (ref 100–199)
HDL: 39 mg/dL — ABNORMAL LOW (ref >39)
LDL Chol Calc (NIH): 108 mg/dL — ABNORMAL HIGH (ref 0–99)
Triglycerides: 277 mg/dL — ABNORMAL HIGH (ref 0–149)
VLDL Cholesterol Cal: 48 mg/dL — ABNORMAL HIGH (ref 5–40)

## 2021-06-11 LAB — MICROALBUMIN / CREATININE URINE RATIO
Creatinine, Urine: 19.3 mg/dL
Microalb/Creat Ratio: 220 mg/g creat — ABNORMAL HIGH (ref 0–29)
Microalbumin, Urine: 42.5 ug/mL

## 2021-06-11 LAB — T4: T4, Total: 12.3 ug/dL — ABNORMAL HIGH (ref 4.5–12.0)

## 2021-06-11 LAB — TSH: TSH: 1.16 u[IU]/mL (ref 0.450–4.500)

## 2021-06-11 LAB — HEMOGLOBIN A1C
Est. average glucose Bld gHb Est-mCnc: 151 mg/dL
Hgb A1c MFr Bld: 6.9 % — ABNORMAL HIGH (ref 4.8–5.6)

## 2021-06-17 ENCOUNTER — Ambulatory Visit: Payer: Self-pay | Admitting: Internal Medicine

## 2021-06-22 ENCOUNTER — Other Ambulatory Visit: Payer: Self-pay

## 2021-06-23 ENCOUNTER — Other Ambulatory Visit: Payer: Self-pay

## 2021-07-15 ENCOUNTER — Other Ambulatory Visit: Payer: Self-pay

## 2021-07-15 ENCOUNTER — Ambulatory Visit: Payer: Self-pay | Admitting: Internal Medicine

## 2021-07-15 ENCOUNTER — Encounter: Payer: Self-pay | Admitting: Internal Medicine

## 2021-07-15 VITALS — BP 186/105 | HR 74 | Temp 98.3°F | Resp 18 | Ht <= 58 in | Wt 208.5 lb

## 2021-07-15 DIAGNOSIS — I10 Essential (primary) hypertension: Secondary | ICD-10-CM

## 2021-07-15 DIAGNOSIS — E119 Type 2 diabetes mellitus without complications: Secondary | ICD-10-CM

## 2021-07-15 DIAGNOSIS — E785 Hyperlipidemia, unspecified: Secondary | ICD-10-CM

## 2021-07-15 DIAGNOSIS — J45909 Unspecified asthma, uncomplicated: Secondary | ICD-10-CM | POA: Insufficient documentation

## 2021-07-15 DIAGNOSIS — E039 Hypothyroidism, unspecified: Secondary | ICD-10-CM

## 2021-07-15 NOTE — Progress Notes (Signed)
Established Patient Office Visit  Subjective:  Patient ID: Lindsey Benjamin, female    DOB: 03-19-1966  Age: 56 y.o. MRN: [id number]  CC:  Chief Complaint  Patient presents with   Follow-up    HPI Lindsey Benjamin is a 56 yo female here for routine follow up of multiple problems.  Blood pressures still elevated. Saw Cardiology and was started on Lasix for weight gain of 3 pounds in 1 day or 5 pounds in 1 week. Patient sees cardiology again in May 2023. Patient states she has not been taking HCTZ or Pravastatin. Discussed need to take these medications.  Patient states she has pulmonology testing 07/31/21.  Past Medical History:  Diagnosis Date   Asthma    Hypertension    Hypothyroidism    Type 2 diabetes mellitus without complication, without long-term current use of insulin ([place]) 03/04/2021    Past Surgical History:  Procedure Laterality Date   CESAREAN SECTION     CHOLECYSTECTOMY      Family History  Problem Relation Age of Onset   Cushing syndrome Mother    Thyroid disease Mother    Heart attack Father 72   Hyperlipidemia Brother    Thyroid disease Brother    Hypertension Brother    Breast cancer Neg Hx     Social History   Socioeconomic History   Marital status: Married    Spouse name: Not on file   Number of children: Not on file   Years of education: Not on file   Highest education level: Not on file  Occupational History   Not on file  Tobacco Use   Smoking status: Every Day    Packs/day: 0.50    Years: 20.00    Pack years: 10.00    Types: Cigarettes   Smokeless tobacco: Never  Vaping Use   Vaping Use: Never used  Substance and Sexual Activity   Alcohol use: Yes    Alcohol/week: 0.0 - 1.0 standard drinks    Comment: occasionally   Drug use: No   Sexual activity: Not on file  Other Topics Concern   Not on file  Social History Narrative   Is on food stamps but they will be cut in half starting next month. She is not worried about this         lives in boarding house      Pt said she is doing well and Is not currently interested in potential aid    Social Determinants of Health   Financial Resource Strain: Not on file  Food Insecurity: Not on file  Transportation Needs: Not on file  Physical Activity: Not on file  Stress: Not on file  Social Connections: Not on file  Intimate Partner Violence: Not on file    Outpatient Medications Prior to Visit  Medication Sig Dispense Refill   acetaminophen (TYLENOL) 500 MG tablet Take 500 mg by mouth every 6 (six) hours as needed.     ADVAIR DISKUS 100-50 MCG/ACT AEPB INHALE ONE PUFF INTO THE LUNGS EVERY 12 HOURS. 180 each 3   atenolol (TENORMIN) 50 MG tablet TAKE ONE TABLET BY MOUTH ONCE EVERY DAY. 90 tablet 3   cetirizine (ZYRTEC) 10 MG tablet TAKE ONE TABLET BY MOUTH EVERY DAY 90 tablet 1   DULoxetine (CYMBALTA) 30 MG capsule Take 2 capsules (79m total) by mouth once daily in the evening. 60 capsule 1   Fluticasone Propionate (FLONASE NA) OTC     Fluticasone-Salmeterol (ADVAIR) 100-50 MCG/DOSE AEPB  Inhale 1 puff into the lungs 2 (two) times daily. Reported on 10/22/2015     furosemide (LASIX) 20 MG tablet Take 1 tablet (20 mg total) by mouth once daily as needed (for weight gain more than 3 pounds in a day or 5 pounds in a week). 30 tablet 6   gabapentin (NEURONTIN) 600 MG tablet TAKE (1/2) TABLET (300MG) BY MOUTH ONCE DAILY AT BEDTIME. 30 tablet 0   hydrOXYzine (ATARAX) 10 MG tablet Take 1-2 tablets by mouth once daily as needed for anxiety. 60 tablet 1   lansoprazole (PREVACID) 30 MG capsule TAKE ONE CAPSULE BY MOUTH EVERY DAY AT NOON 90 capsule 3   levothyroxine (SYNTHROID) 175 MCG tablet TAKE ONE TABLET BY MOUTH ONCE EVERY DAY BEFORE BREAKFAST. 90 tablet 3   losartan (COZAAR) 50 MG tablet TAKE TWO TABLETS BY MOUTH EVERY DAY 180 tablet 3   PROAIR HFA 108 (90 Base) MCG/ACT inhaler INHALE 2 PUFFS FOUR TIMES A DAY. REPLACES VENTOLIN. 34 g 3   spironolactone (ALDACTONE) 25 MG tablet  Take 1 tablet (25 mg total) by mouth once daily. 30 tablet 6   hydrochlorothiazide (MICROZIDE) 12.5 MG capsule TAKE TWO CAPSULES BY MOUTH ONCE EVERY DAY. (Patient not taking: Reported on 07/15/2021) 180 capsule 0   pravastatin (PRAVACHOL) 40 MG tablet TAKE ONE TABLET BY MOUTH EVERY DAY 30 tablet 11   FLUoxetine (PROZAC) 10 MG capsule Take 10 mg by mouth. (Patient not taking: Reported on 03/04/2021)     losartan (COZAAR) 100 MG tablet Take 1 tablet (100 mg total) by mouth daily. (Patient not taking: Reported on 03/04/2021) 90 tablet 3   triamcinolone cream (KENALOG) 0.1 % Apply 1 application topically 2 (two) times daily. (Patient not taking: Reported on 03/04/2021) 30 g 1   No facility-administered medications prior to visit.    Allergies  Allergen Reactions   Ofloxacin Swelling   Penicillins Anaphylaxis   Ranitidine Hives   Lisinopril Cough   Ceclor [Cefaclor] Hives   Zantac [Ranitidine Hcl]     hives   Levaquin [Levofloxacin In D5w] Rash    Peel her skin   Levofloxacin Other (See Comments) and Rash   Tomato Rash    ROS Review of Systems    Objective:    Physical Exam  BP (!) 186/105 (BP Location: Left Arm, Patient Position: Sitting, Cuff Size: Large)    Pulse 74    Temp 98.3 F (36.8 C) (Oral)    Resp 18    Ht 4' 9.25" (1.454 m)    Wt 208 lb 8 oz (94.6 kg)    LMP 07/15/2016 (Approximate)    SpO2 94%    BMI 44.73 kg/m  Wt Readings from Last 3 Encounters:  07/15/21 208 lb 8 oz (94.6 kg)  06/10/21 209 lb 9.6 oz (95.1 kg)  03/04/21 204 lb 8 oz (92.8 kg)     Health Maintenance Due  Topic Date Due   COVID-19 Vaccine (1) Never done   OPHTHALMOLOGY EXAM  Never done   HIV Screening  Never done   Zoster Vaccines- Shingrix (1 of 2) Never done   COLONOSCOPY (Pts 45-50yr Insurance coverage will need to be confirmed)  Never done   Pneumococcal Vaccine [id number] old (2 - PCV) 04/10/2020   PAP SMEAR-Modifier  04/20/2020   FOOT EXAM  04/23/2021    There are no preventive care  reminders to display for this patient.  Lab Results  Component Value Date   TSH 1.160 06/10/2021   Lab Results  Component Value Date   WBC 6.6 06/10/2021   HGB 15.2 06/10/2021   HCT 44.3 06/10/2021   MCV 88 06/10/2021   PLT 203 06/10/2021   Lab Results  Component Value Date   NA 137 06/10/2021   K 4.6 06/10/2021   CO2 27 06/10/2021   GLUCOSE 126 (H) 06/10/2021   BUN 13 06/10/2021   CREATININE 0.82 06/10/2021   BILITOT 0.4 06/10/2021   ALKPHOS 104 06/10/2021   AST 65 (H) 06/10/2021   ALT 102 (H) 06/10/2021   PROT 7.3 06/10/2021   ALBUMIN 4.4 06/10/2021   CALCIUM 9.6 06/10/2021   EGFR 83 06/10/2021   Lab Results  Component Value Date   CHOL 195 06/10/2021   Lab Results  Component Value Date   HDL 39 (L) 06/10/2021   Lab Results  Component Value Date   LDLCALC 108 (H) 06/10/2021   Lab Results  Component Value Date   TRIG 277 (H) 06/10/2021   Lab Results  Component Value Date   CHOLHDL 5.0 (H) 06/10/2021   Lab Results  Component Value Date   HGBA1C 6.9 (H) 06/10/2021      Assessment & Plan:   Problem List Items Addressed This Visit       Cardiovascular and Mediastinum   Essential hypertension - Primary     Endocrine   Hypothyroidism   Type 2 diabetes mellitus without complication, without long-term current use of insulin ([place])   1. Essential hypertension Patient continues to have elevation in her BP. She has not been taking HCTZ as suggested. Still having evaluation for COPD. Encouraged to restart HCTZ  2. Hypothyroidism, unspecified type Labs appear to be stable, no change in medications.  3. Type 2 diabetes mellitus without complication, without long-term current use of insulin ([facility]) A1C 6.9 on 06/10/2021, no change in medications suggested.  4. Asthma No recent flare. Continue same treatment.  5. Hyperlipidemia Patient has not been taking her Pravastating, encoughed to restart. Follow up labs in 3 months.  Follow-up:  Follow up 3  months for labs and office visit 1 week after labs.   Sonnie Alamo, CMA

## 2021-07-22 ENCOUNTER — Other Ambulatory Visit: Payer: Self-pay

## 2021-07-24 ENCOUNTER — Other Ambulatory Visit: Payer: Self-pay

## 2021-08-06 MED FILL — Levothyroxine Sodium Tab 175 MCG: ORAL | 90 days supply | Qty: 90 | Fill #1 | Status: AC

## 2021-08-07 ENCOUNTER — Other Ambulatory Visit: Payer: Self-pay

## 2021-08-11 ENCOUNTER — Other Ambulatory Visit: Payer: Self-pay

## 2021-08-11 MED ORDER — HYDROXYZINE HCL 10 MG PO TABS
ORAL_TABLET | ORAL | 5 refills | Status: DC
  Filled 2021-08-11 – 2021-12-25 (×2): qty 60, 30d supply, fill #0
  Filled 2021-12-25 – 2022-02-02 (×2): qty 60, 30d supply, fill #1

## 2021-08-11 MED ORDER — GABAPENTIN 600 MG PO TABS
300.0000 mg | ORAL_TABLET | Freq: Every day | ORAL | 5 refills | Status: AC
  Filled 2021-08-11 – 2021-12-25 (×2): qty 15, 30d supply, fill #0
  Filled 2021-12-25 – 2022-02-02 (×2): qty 15, 30d supply, fill #1
  Filled 2022-08-05: qty 15, 30d supply, fill #2

## 2021-08-11 MED ORDER — DULOXETINE HCL 30 MG PO CPEP
ORAL_CAPSULE | ORAL | 5 refills | Status: DC
  Filled 2021-08-11: qty 30, 30d supply, fill #0
  Filled 2021-08-11 – 2021-08-26 (×2): qty 60, 30d supply, fill #0
  Filled 2021-08-26: qty 60, fill #0
  Filled 2021-09-03: qty 60, 30d supply, fill #0
  Filled 2021-09-29: qty 60, 30d supply, fill #1
  Filled 2021-10-28: qty 60, 30d supply, fill #2
  Filled 2021-12-03 – 2022-02-02 (×5): qty 60, 30d supply, fill #0

## 2021-08-24 ENCOUNTER — Other Ambulatory Visit: Payer: Self-pay

## 2021-08-26 ENCOUNTER — Other Ambulatory Visit: Payer: Self-pay

## 2021-08-26 MED FILL — Losartan Potassium Tab 50 MG: ORAL | 90 days supply | Qty: 180 | Fill #1 | Status: AC

## 2021-08-27 ENCOUNTER — Other Ambulatory Visit: Payer: Self-pay

## 2021-09-03 ENCOUNTER — Other Ambulatory Visit: Payer: Self-pay

## 2021-09-28 ENCOUNTER — Telehealth: Payer: Self-pay | Admitting: Pharmacist

## 2021-09-28 NOTE — Telephone Encounter (Signed)
09/28/2021 3:18:48 PM - ProAir prescription requested from dr ?-- Tarry Kos - Monday, September 28, 2021 3:17 PM ? ?[employer] extended enrollment for ProAir until 05/04/22. Per Denyse Amass @ [employer] pt has no refills left and needs a new prescription. Printed script for provider to sign in [facility] folder on my desk. ? ? ?

## 2021-09-29 ENCOUNTER — Other Ambulatory Visit: Payer: Self-pay | Admitting: Gerontology

## 2021-09-29 ENCOUNTER — Other Ambulatory Visit: Payer: Self-pay

## 2021-09-29 ENCOUNTER — Telehealth: Payer: Self-pay | Admitting: Pharmacy Technician

## 2021-09-29 DIAGNOSIS — J302 Other seasonal allergic rhinitis: Secondary | ICD-10-CM

## 2021-09-29 MED ORDER — ADVAIR DISKUS 100-50 MCG/ACT IN AEPB
INHALATION_SPRAY | RESPIRATORY_TRACT | 3 refills | Status: DC
  Filled 2021-09-29: qty 180, fill #0
  Filled 2021-10-07: qty 180, 90d supply, fill #0

## 2021-09-29 MED FILL — Lansoprazole Cap Delayed Release 30 MG: ORAL | 90 days supply | Qty: 90 | Fill #1 | Status: AC

## 2021-09-29 MED FILL — Cetirizine HCl Tab 10 MG: ORAL | 90 days supply | Qty: 90 | Fill #0 | Status: AC

## 2021-09-29 MED FILL — Hydrochlorothiazide Cap 12.5 MG: ORAL | 90 days supply | Qty: 180 | Fill #0 | Status: AC

## 2021-09-29 NOTE — Telephone Encounter (Signed)
Patient still needs to provide [facility] Disability award letter from spouse, Dorinda Hill.  ? ?Sherilyn Dacosta ?Care Manager ?[facility] ?

## 2021-10-07 ENCOUNTER — Other Ambulatory Visit: Payer: Self-pay

## 2021-10-08 ENCOUNTER — Other Ambulatory Visit: Payer: Self-pay

## 2021-10-08 MED ORDER — PRAVASTATIN SODIUM 40 MG PO TABS
40.0000 mg | ORAL_TABLET | Freq: Every day | ORAL | 11 refills | Status: DC
  Filled 2021-10-08 – 2021-12-10 (×2): qty 90, 90d supply, fill #0
  Filled 2021-12-10 – 2022-04-28 (×2): qty 90, 90d supply, fill #1
  Filled 2022-08-05 (×2): qty 90, 90d supply, fill #2

## 2021-10-09 ENCOUNTER — Telehealth: Payer: Self-pay | Admitting: Pharmacist

## 2021-10-09 NOTE — Telephone Encounter (Signed)
10/09/2021 8:51:33 AM - ProAir faxed to [employer] ?-- Tarry Kos - Friday, October 09, 2021 8:50 AM -- ProAir refill faxed to [facility] ?

## 2021-10-14 ENCOUNTER — Ambulatory Visit: Payer: Self-pay | Admitting: Internal Medicine

## 2021-10-22 ENCOUNTER — Other Ambulatory Visit: Payer: Self-pay

## 2021-10-28 ENCOUNTER — Other Ambulatory Visit: Payer: Self-pay

## 2021-10-29 ENCOUNTER — Telehealth: Payer: Self-pay | Admitting: Pharmacist

## 2021-10-29 ENCOUNTER — Other Ambulatory Visit: Payer: Self-pay

## 2021-10-29 NOTE — Telephone Encounter (Signed)
10/29/2021 8:44:36 AM - Advair last order picked up ?-- Tarry Kos - Thursday, October 29, 2021 8:42 AM --Patient picked up last order of Advair on 10/28/21. No longer available through [employer] after 11/02/21. ?

## 2021-11-03 ENCOUNTER — Other Ambulatory Visit: Payer: Self-pay

## 2021-11-04 ENCOUNTER — Ambulatory Visit: Payer: Self-pay | Admitting: Internal Medicine

## 2021-11-04 ENCOUNTER — Other Ambulatory Visit: Payer: Self-pay

## 2021-11-04 DIAGNOSIS — I10 Essential (primary) hypertension: Secondary | ICD-10-CM

## 2021-11-04 DIAGNOSIS — E039 Hypothyroidism, unspecified: Secondary | ICD-10-CM

## 2021-11-04 DIAGNOSIS — E119 Type 2 diabetes mellitus without complications: Secondary | ICD-10-CM

## 2021-11-05 ENCOUNTER — Other Ambulatory Visit: Payer: Self-pay

## 2021-11-11 ENCOUNTER — Ambulatory Visit: Payer: Self-pay | Admitting: Gerontology

## 2021-11-11 ENCOUNTER — Other Ambulatory Visit: Payer: Self-pay

## 2021-11-11 ENCOUNTER — Encounter: Payer: Self-pay | Admitting: Gerontology

## 2021-11-11 VITALS — BP 149/80 | HR 70 | Temp 97.6°F | Resp 18 | Ht <= 58 in | Wt 207.7 lb

## 2021-11-11 DIAGNOSIS — I10 Essential (primary) hypertension: Secondary | ICD-10-CM

## 2021-11-11 DIAGNOSIS — F489 Nonpsychotic mental disorder, unspecified: Secondary | ICD-10-CM

## 2021-11-11 DIAGNOSIS — E119 Type 2 diabetes mellitus without complications: Secondary | ICD-10-CM

## 2021-11-11 DIAGNOSIS — E039 Hypothyroidism, unspecified: Secondary | ICD-10-CM

## 2021-11-11 LAB — COMPREHENSIVE METABOLIC PANEL
ALT: 52 IU/L — ABNORMAL HIGH (ref 0–32)
AST: 47 IU/L — ABNORMAL HIGH (ref 0–40)
Albumin/Globulin Ratio: 1.5 (ref 1.2–2.2)
Albumin: 4.4 g/dL (ref 3.8–4.9)
Alkaline Phosphatase: 77 IU/L (ref 44–121)
BUN/Creatinine Ratio: 14 (ref 9–23)
BUN: 13 mg/dL (ref 6–24)
Bilirubin Total: 0.4 mg/dL (ref 0.0–1.2)
CO2: 24 mmol/L (ref 20–29)
Calcium: 9.4 mg/dL (ref 8.7–10.2)
Chloride: 96 mmol/L (ref 96–106)
Creatinine, Ser: 0.95 mg/dL (ref 0.57–1.00)
Globulin, Total: 3 g/dL (ref 1.5–4.5)
Glucose: 137 mg/dL — ABNORMAL HIGH (ref 70–99)
Potassium: 4.3 mmol/L (ref 3.5–5.2)
Sodium: 137 mmol/L (ref 134–144)
Total Protein: 7.4 g/dL (ref 6.0–8.5)
eGFR: 69 mL/min/{1.73_m2} (ref >59)

## 2021-11-11 LAB — CBC WITH DIFFERENTIAL/PLATELET
Basophils Absolute: 0.1 10*3/uL (ref 0.0–0.2)
Basos: 1 %
EOS (ABSOLUTE): 0.1 10*3/uL (ref 0.0–0.4)
Eos: 2 %
Hematocrit: 46.4 % (ref 34.0–46.6)
Hemoglobin: 15.6 g/dL (ref 11.1–15.9)
Immature Grans (Abs): 0 10*3/uL (ref 0.0–0.1)
Immature Granulocytes: 0 %
Lymphocytes Absolute: 2.3 10*3/uL (ref 0.7–3.1)
Lymphs: 35 %
MCH: 29.8 pg (ref 26.6–33.0)
MCHC: 33.6 g/dL (ref 31.5–35.7)
MCV: 89 fL (ref 79–97)
Monocytes Absolute: 0.6 10*3/uL (ref 0.1–0.9)
Monocytes: 9 %
Neutrophils Absolute: 3.6 10*3/uL (ref 1.4–7.0)
Neutrophils: 53 %
Platelets: 236 10*3/uL (ref 150–450)
RBC: 5.24 x10E6/uL (ref 3.77–5.28)
RDW: 14.3 % (ref 11.7–15.4)
WBC: 6.7 10*3/uL (ref 3.4–10.8)

## 2021-11-11 LAB — URINALYSIS, ROUTINE W REFLEX MICROSCOPIC
Bilirubin, UA: NEGATIVE
Glucose, UA: NEGATIVE
Nitrite, UA: NEGATIVE
RBC, UA: NEGATIVE
Specific Gravity, UA: 1.023 (ref 1.005–1.030)
Urobilinogen, Ur: 1 mg/dL (ref 0.2–1.0)
pH, UA: 5 (ref 5.0–7.5)

## 2021-11-11 LAB — MICROSCOPIC EXAMINATION
Casts: NONE SEEN /lpf
Epithelial Cells (non renal): >10 /hpf — AB (ref 0–10)
RBC, Urine: NONE SEEN /hpf (ref 0–2)

## 2021-11-11 LAB — HEMOGLOBIN A1C
Est. average glucose Bld gHb Est-mCnc: 171 mg/dL
Hgb A1c MFr Bld: 7.6 % — ABNORMAL HIGH (ref 4.8–5.6)

## 2021-11-11 LAB — GLUCOSE, POCT (MANUAL RESULT ENTRY): POC Glucose: 108 mg/dl — AB (ref 70–99)

## 2021-11-11 LAB — TSH: TSH: 2.52 u[IU]/mL (ref 0.450–4.500)

## 2021-11-11 LAB — T4: T4, Total: 12.6 ug/dL — ABNORMAL HIGH (ref 4.5–12.0)

## 2021-11-11 MED ORDER — BLOOD PRESSURE KIT
1.0000 | PACK | Freq: Every day | 0 refills | Status: DC
  Filled 2021-11-11 – 2021-12-22 (×3): qty 1, fill #0

## 2021-11-11 MED ORDER — RIGHTEST GS550 BLOOD GLUCOSE VI STRP
ORAL_STRIP | 0 refills | Status: DC
  Filled 2021-11-11: qty 100, 25d supply, fill #0

## 2021-11-11 MED ORDER — BLOOD GLUCOSE MONITOR KIT
PACK | 0 refills | Status: AC
  Filled 2021-11-11: qty 1, 30d supply, fill #0

## 2021-11-11 MED ORDER — GLIPIZIDE 5 MG PO TABS
2.5000 mg | ORAL_TABLET | Freq: Every day | ORAL | 0 refills | Status: DC
  Filled 2021-11-11: qty 15, 30d supply, fill #0

## 2021-11-11 MED ORDER — RIGHTEST GL300 LANCETS MISC
0 refills | Status: DC
  Filled 2021-11-11: qty 100, 25d supply, fill #0

## 2021-11-11 MED ORDER — GLIPIZIDE 5 MG PO TABS
5.0000 mg | ORAL_TABLET | Freq: Every day | ORAL | 0 refills | Status: DC
  Filled 2021-11-11: qty 30, 30d supply, fill #0

## 2021-11-11 NOTE — Patient Instructions (Signed)
Carbohydrate Counting for Diabetes Mellitus, Adult ?Carbohydrate counting is a method of keeping track of how many carbohydrates you eat. Eating carbohydrates increases the amount of sugar (glucose) in the blood. Counting how many carbohydrates you eat improves how well you manage your blood glucose. This, in turn, helps you manage your diabetes. ?Carbohydrates are measured in grams (g) per serving. It is important to know how many carbohydrates (in grams or by serving size) you can have in each meal. This is different for every person. A dietitian can help you make a meal plan and calculate how many carbohydrates you should have at each meal and snack. ?What foods contain carbohydrates? ?Carbohydrates are found in the following foods: ?Grains, such as breads and cereals. ?Dried beans and soy products. ?Starchy vegetables, such as potatoes, peas, and corn. ?Fruit and fruit juices. ?Milk and yogurt. ?Sweets and snack foods, such as cake, cookies, candy, chips, and soft drinks. ?How do I count carbohydrates in foods? ?There are two ways to count carbohydrates in food. You can read food labels or learn standard serving sizes of foods. You can use either of these methods or a combination of both. ?Using the Nutrition Facts label ?The Nutrition Facts list is included on the labels of almost all packaged foods and beverages in the [place]. It includes: ?The serving size. ?Information about nutrients in each serving, including the grams of carbohydrate per serving. ?To use the Nutrition Facts, decide how many servings you will have. Then, multiply the number of servings by the number of carbohydrates per serving. The resulting number is the total grams of carbohydrates that you will be having. ?Learning the standard serving sizes of foods ?When you eat carbohydrate foods that are not packaged or do not include Nutrition Facts on the label, you need to measure the servings in order to count the grams of  carbohydrates. ?Measure the foods that you will eat with a food scale or measuring cup, if needed. ?Decide how many standard-size servings you will eat. ?Multiply the number of servings by 15. For foods that contain carbohydrates, one serving equals 15 g of carbohydrates. ?For example, if you eat 2 cups or 10 oz (300 g) of strawberries, you will have eaten 2 servings and 30 g of carbohydrates (2 servings x 15 g = 30 g). ?For foods that have more than one food mixed, such as soups and casseroles, you must count the carbohydrates in each food that is included. ?The following list contains standard serving sizes of common carbohydrate-rich foods. Each of these servings has about 15 g of carbohydrates: ?1 slice of bread. ?1 six-inch (15 cm) tortilla. ?? cup or 2 oz (53 g) cooked rice or pasta. ?? cup or 3 oz (85 g) cooked or canned, drained and rinsed beans or lentils. ?? cup or 3 oz (85 g) starchy vegetable, such as peas, corn, or squash. ?? cup or 4 oz (120 g) hot cereal. ?? cup or 3 oz (85 g) boiled or mashed potatoes, or ? or 3 oz (85 g) of a large baked potato. ?? cup or 4 fl oz (118 mL) fruit juice. ?1 cup or 8 fl oz (237 mL) milk. ?1 small or 4 oz (106 g) apple. ?? or 2 oz (63 g) of a medium banana. ?1 cup or 5 oz (150 g) strawberries. ?3 cups or 1 oz (28.3 g) popped popcorn. ?What is an example of carbohydrate counting? ?To calculate the grams of carbohydrates in this sample meal, follow the steps   shown below. ?Sample meal ?3 oz (85 g) chicken breast. ?? cup or 4 oz (106 g) brown rice. ?? cup or 3 oz (85 g) corn. ?1 cup or 8 fl oz (237 mL) milk. ?1 cup or 5 oz (150 g) strawberries with sugar-free whipped topping. ?Carbohydrate calculation ?Identify the foods that contain carbohydrates: ?Rice. ?Corn. ?Milk. ?Strawberries. ?Calculate how many servings you have of each food: ?2 servings rice. ?1 serving corn. ?1 serving milk. ?1 serving strawberries. ?Multiply each number of servings by 15 g: ?2 servings rice x 15  g = 30 g. ?1 serving corn x 15 g = 15 g. ?1 serving milk x 15 g = 15 g. ?1 serving strawberries x 15 g = 15 g. ?Add together all of the amounts to find the total grams of carbohydrates eaten: ?30 g + 15 g + 15 g + 15 g = 75 g of carbohydrates total. ?What are tips for following this plan? ?Shopping ?Develop a meal plan and then make a shopping list. ?Buy fresh and frozen vegetables, fresh and frozen fruit, dairy, eggs, beans, lentils, and whole grains. ?Look at food labels. Choose foods that have more fiber and less sugar. ?Avoid processed foods and foods with added sugars. ?Meal planning ?Aim to have the same number of grams of carbohydrates at each meal and for each snack time. ?Plan to have regular, balanced meals and snacks. ?Where to find more information ?American Diabetes Association: diabetes.org ?Centers for Disease Control and Prevention: cdc.gov ?Academy of Nutrition and Dietetics: eatright.org ?Association of Diabetes Care & Education Specialists: diabeteseducator.org ?Summary ?Carbohydrate counting is a method of keeping track of how many carbohydrates you eat. ?Eating carbohydrates increases the amount of sugar (glucose) in your blood. ?Counting how many carbohydrates you eat improves how well you manage your blood glucose. This helps you manage your diabetes. ?A dietitian can help you make a meal plan and calculate how many carbohydrates you should have at each meal and snack. ?This information is not intended to replace advice given to you by your health care provider. Make sure you discuss any questions you have with your health care provider. ?Document Revised: 01/23/2020 Document Reviewed: 01/23/2020 ?Elsevier Patient Education ? 2023 Elsevier Inc. ?DASH Eating Plan ?DASH stands for Dietary Approaches to Stop Hypertension. The DASH eating plan is a healthy eating plan that has been shown to: ?Reduce high blood pressure (hypertension). ?Reduce your risk for type 2 diabetes, heart disease, and  stroke. ?Help with weight loss. ?What are tips for following this plan? ?Reading food labels ?Check food labels for the amount of salt (sodium) per serving. Choose foods with less than 5 percent of the Daily Value of sodium. Generally, foods with less than 300 milligrams (mg) of sodium per serving fit into this eating plan. ?To find whole grains, look for the word "whole" as the first word in the ingredient list. ?Shopping ?Buy products labeled as "low-sodium" or "no salt added." ?Buy fresh foods. Avoid canned foods and pre-made or frozen meals. ?Cooking ?Avoid adding salt when cooking. Use salt-free seasonings or herbs instead of table salt or sea salt. Check with your health care provider or pharmacist before using salt substitutes. ?Do not fry foods. Cook foods using healthy methods such as baking, boiling, grilling, roasting, and broiling instead. ?Cook with heart-healthy oils, such as olive, canola, avocado, soybean, or sunflower oil. ?Meal planning ? ?Eat a balanced diet that includes: ?4 or more servings of fruits and 4 or more servings of vegetables each day.   Try to fill one-half of your plate with fruits and vegetables. ?6-8 servings of whole grains each day. ?Less than 6 oz (170 g) of lean meat, poultry, or fish each day. A 3-oz (85-g) serving of meat is about the same size as a deck of cards. One egg equals 1 oz (28 g). ?2-3 servings of low-fat dairy each day. One serving is 1 cup (237 mL). ?1 serving of nuts, seeds, or beans 5 times each week. ?2-3 servings of heart-healthy fats. Healthy fats called omega-3 fatty acids are found in foods such as walnuts, flaxseeds, fortified milks, and eggs. These fats are also found in cold-water fish, such as sardines, salmon, and mackerel. ?Limit how much you eat of: ?Canned or prepackaged foods. ?Food that is high in trans fat, such as some fried foods. ?Food that is high in saturated fat, such as fatty meat. ?Desserts and other sweets, sugary drinks, and other foods  with added sugar. ?Full-fat dairy products. ?Do not salt foods before eating. ?Do not eat more than 4 egg yolks a week. ?Try to eat at least 2 vegetarian meals a week. ?Eat more home-cooked food and less restaurant, bu

## 2021-11-11 NOTE — Progress Notes (Signed)
? ?Established Patient Office Visit ? ?Subjective   ?Patient ID: Lindsey Benjamin, female    DOB: February 21, 1966  Age: 56 y.o. MRN: [id number] ? ?Chief Complaint  ?Patient presents with  ? Follow-up  ?  Labs drawn 11/04/21  ? ? ?HPI ? ?Lindsey Benjamin is a 56 yo female who has history of hypothyroidism,hypertension, Asthma, hyperlipidemia,sleep apnea on Cpap, presents for routine follow up visit and lab review. Her blood pressure was elevated at 176/115, she states that her machine broke and has not been checking it at home. She denies chest pain, palpitation, headache and vision changes. She has a history of Asthma, states that her breathing is stable, experiences intermittent shortness of breath with exertion, such as walking 4 blocks, takes frequent rest, smokes 1/2 pack of cigarette daily and admits the desire to quit. She request the discontinuation of her Advair inhaler. Her urine WBC was 11-30/hpf, trace leukocytes, she denies dysuria, urinary frequency, urgency, flank and pelvic pain, and will recheck urine. Her HgbA1c increased from 6.9% to 7.6%, she is not on any diabetes medication, declines metformin therapy. She denies hypo/hyperglycemic symptoms, states that peripheral neuropathy is under control with taking gabapentin and performs daily foot checks. Her blood glucose checked during visit was 108 mg/dl. Her TSH was 2.520 uIU/ml, she continues on 175 mcg levothyroxine, denies cold/heat intolerance and constipation. She states that her mood is good, denies suicidal nor homicidal ideation. Overall, she states that she's doing well and offers no further complaint. ? ?Review of Systems  ?Constitutional: Negative.   ?Eyes: Negative.   ?Respiratory:  Positive for shortness of breath.   ?Cardiovascular: Negative.   ?Skin: Negative.   ?Neurological: Negative.   ?Endo/Heme/Allergies: Negative.   ?Psychiatric/Behavioral: Negative.    ? ?  ?Objective:  ?  ? ?BP (!) 149/80 (BP Location: Right Arm, Patient Position:  Sitting, Cuff Size: Large)   Pulse 70   Temp 97.6 ?F (36.4 ?C) (Oral)   Resp 18   Ht '4\' 9"'  (1.448 m)   Wt 207 lb 11.2 oz (94.2 kg)   LMP 07/18/2016 (Exact Date)   SpO2 95%   BMI 44.95 kg/m?  ?BP Readings from Last 3 Encounters:  ?11/11/21 (!) 149/80  ?11/04/21 (!) 178/97  ?07/15/21 (!) 186/105  ? ?Wt Readings from Last 3 Encounters:  ?11/11/21 207 lb 11.2 oz (94.2 kg)  ?11/04/21 207 lb 4.8 oz (94 kg)  ?07/15/21 208 lb 8 oz (94.6 kg)  ? ?  ? ?Physical Exam ?HENT:  ?   Head: Normocephalic and atraumatic.  ?   Mouth/Throat:  ?   Mouth: Mucous membranes are moist.  ?Eyes:  ?   Extraocular Movements: Extraocular movements intact.  ?   Conjunctiva/sclera: Conjunctivae normal.  ?   Pupils: Pupils are equal, round, and reactive to light.  ?Cardiovascular:  ?   Rate and Rhythm: Normal rate and regular rhythm.  ?   Pulses: Normal pulses.  ?   Heart sounds: Normal heart sounds.  ?Pulmonary:  ?   Effort: Pulmonary effort is normal.  ?   Breath sounds: Normal breath sounds.  ?Skin: ?   General: Skin is warm.  ?Neurological:  ?   General: No focal deficit present.  ?   Mental Status: She is alert and oriented to person, place, and time. Mental status is at baseline.  ?Psychiatric:     ?   Mood and Affect: Mood normal.     ?   Behavior: Behavior normal.     ?  Thought Content: Thought content normal.     ?   Judgment: Judgment normal.  ? ? ? ?Results for orders placed or performed in visit on 11/11/21  ?POCT Glucose (CBG)  ?Result Value Ref Range  ? POC Glucose 108 (A) 70 - 99 mg/dl  ? ? ?Last CBC ?Lab Results  ?Component Value Date  ? WBC 6.7 11/04/2021  ? HGB 15.6 11/04/2021  ? HCT 46.4 11/04/2021  ? MCV 89 11/04/2021  ? MCH 29.8 11/04/2021  ? RDW 14.3 11/04/2021  ? PLT 236 11/04/2021  ? ?Last metabolic panel ?Lab Results  ?Component Value Date  ? GLUCOSE 137 (H) 11/04/2021  ? NA 137 11/04/2021  ? K 4.3 11/04/2021  ? CL 96 11/04/2021  ? CO2 24 11/04/2021  ? BUN 13 11/04/2021  ? CREATININE 0.95 11/04/2021  ? EGFR 69  11/04/2021  ? CALCIUM 9.4 11/04/2021  ? PROT 7.4 11/04/2021  ? ALBUMIN 4.4 11/04/2021  ? LABGLOB 3.0 11/04/2021  ? AGRATIO 1.5 11/04/2021  ? BILITOT 0.4 11/04/2021  ? ALKPHOS 77 11/04/2021  ? AST 47 (H) 11/04/2021  ? ALT 52 (H) 11/04/2021  ? ?Last lipids ?Lab Results  ?Component Value Date  ? CHOL 195 06/10/2021  ? HDL 39 (L) 06/10/2021  ? LDLCALC 108 (H) 06/10/2021  ? TRIG 277 (H) 06/10/2021  ? CHOLHDL 5.0 (H) 06/10/2021  ? ?Last hemoglobin A1c ?Lab Results  ?Component Value Date  ? HGBA1C 7.6 (H) 11/04/2021  ? ?Last thyroid functions ?Lab Results  ?Component Value Date  ? TSH 2.520 11/04/2021  ? T4TOTAL 12.6 (H) 11/04/2021  ? ?Last vitamin D ?No results found for: 25OHVITD2, [place], VD25OH ?Last vitamin B12 and Folate ?No results found for: VITAMINB12, FOLATE ?  ? ?The 10-year ASCVD risk score (Arnett DK, et al., 2019) is: 24.4% ? ?  ?Assessment & Plan:  ? ?1. Essential hypertension ?- Her blood pressure was 149/80 when rechecked and her goal should be less than 130/80. She was continue on current medication, DASH diet and exercise as tolerated. ?- Urinalysis; Future ?- Iron Binding Cap (TIBC)([facility]); Future ?- Blood Pressure KIT; 1 kit by Does not apply route daily.  Dispense: 1 kit; Refill: 0 ?- Iron Binding Cap (TIBC)([facility]) ?- Urinalysis ? ?2. Hypothyroidism, unspecified type ?- She is euthyroid, will continue on current medication. ? ?3. Type 2 diabetes mellitus without complication, without long-term current use of insulin ([place]) ?- Her HgbA1c was 7.6%, her goal should be less than 7%, she was started on 2.5 mg glipizide, was educated on medication side effects, advised to notify clinic. She will check blood glucose daily, record and bring log to follow up appointment. She was encouraged to continue on low carb/non concentrated sweet diet and exercise as tolerated. ?- blood glucose meter kit and supplies KIT; Dispense based on patient and insurance preference. Use up to four times  daily as directed. (FOR ICD-9 250.00, 250.01).  Dispense: 1 each; Refill: 0 ?- POCT Glucose (CBG); Future ?- glipiZIDE (GLUCOTROL) 5 MG tablet; Take 0.5 tablets (2.5 mg total) by mouth daily before breakfast.  Dispense: 15 tablet; Refill: 0 ?- POCT Glucose (CBG) ? ?4. Mental health problem ?- She will continue on her current medication, and follow up with [facility] Behavioral health team. ? ? ? ?Return in about 29 days (around 12/10/2021), or if symptoms worsen or fail to improve.  ? ? ? Jerold Coombe, NP ? ?

## 2021-11-12 LAB — IRON AND TIBC
Iron Saturation: 23 % (ref 15–55)
Iron: 103 ug/dL (ref 27–159)
Total Iron Binding Capacity: 449 ug/dL (ref 250–450)
UIBC: 346 ug/dL (ref 131–425)

## 2021-11-12 LAB — URINALYSIS
Bilirubin, UA: NEGATIVE
Glucose, UA: NEGATIVE
Ketones, UA: NEGATIVE
Leukocytes,UA: NEGATIVE
Nitrite, UA: NEGATIVE
Specific Gravity, UA: 1.015 (ref 1.005–1.030)
Urobilinogen, Ur: 1 mg/dL (ref 0.2–1.0)
pH, UA: 8 — ABNORMAL HIGH (ref 5.0–7.5)

## 2021-11-16 ENCOUNTER — Other Ambulatory Visit: Payer: Self-pay

## 2021-11-18 ENCOUNTER — Institutional Professional Consult (permissible substitution): Payer: Self-pay | Admitting: Licensed Clinical Social Worker

## 2021-11-18 ENCOUNTER — Encounter: Payer: Self-pay | Admitting: Licensed Clinical Social Worker

## 2021-11-18 ENCOUNTER — Telehealth: Payer: Self-pay | Admitting: Licensed Clinical Social Worker

## 2021-11-18 NOTE — Progress Notes (Signed)
This encounter was created in error - please disregard.

## 2021-11-18 NOTE — Telephone Encounter (Signed)
Called patient for her [facility] consult appointment. Patient informed me that she is a patient with [facility] and has all her mental healthcare needs met thorough them. I thanked the patient for her time and canceled her appointment.  ?

## 2021-12-03 ENCOUNTER — Other Ambulatory Visit: Payer: Self-pay

## 2021-12-09 ENCOUNTER — Other Ambulatory Visit: Payer: Self-pay | Admitting: Emergency Medicine

## 2021-12-09 ENCOUNTER — Other Ambulatory Visit: Payer: Self-pay | Admitting: Gerontology

## 2021-12-09 ENCOUNTER — Other Ambulatory Visit: Payer: Self-pay

## 2021-12-09 ENCOUNTER — Telehealth: Payer: Self-pay

## 2021-12-09 ENCOUNTER — Ambulatory Visit: Payer: Self-pay | Admitting: Gerontology

## 2021-12-09 DIAGNOSIS — E119 Type 2 diabetes mellitus without complications: Secondary | ICD-10-CM

## 2021-12-09 MED ORDER — GLIPIZIDE 5 MG PO TABS
2.5000 mg | ORAL_TABLET | Freq: Every day | ORAL | 0 refills | Status: DC
  Filled 2021-12-09: qty 15, 30d supply, fill #0

## 2021-12-09 MED ORDER — RIGHTEST GS550 BLOOD GLUCOSE VI STRP
ORAL_STRIP | 0 refills | Status: DC
  Filled 2021-12-10: qty 100, fill #0
  Filled 2021-12-10: qty 100, 25d supply, fill #0

## 2021-12-10 ENCOUNTER — Other Ambulatory Visit: Payer: Self-pay

## 2021-12-10 MED FILL — Lansoprazole Cap Delayed Release 30 MG: ORAL | 90 days supply | Qty: 90 | Fill #0 | Status: AC

## 2021-12-10 MED FILL — Lansoprazole Cap Delayed Release 30 MG: ORAL | 90 days supply | Qty: 90 | Fill #2 | Status: CN

## 2021-12-11 ENCOUNTER — Other Ambulatory Visit: Payer: Self-pay

## 2021-12-18 ENCOUNTER — Other Ambulatory Visit: Payer: Self-pay

## 2021-12-22 ENCOUNTER — Other Ambulatory Visit: Payer: Self-pay

## 2021-12-22 ENCOUNTER — Ambulatory Visit: Payer: Self-pay | Admitting: Gerontology

## 2021-12-22 ENCOUNTER — Encounter: Payer: Self-pay | Admitting: Gerontology

## 2021-12-22 ENCOUNTER — Other Ambulatory Visit: Payer: Self-pay | Admitting: Internal Medicine

## 2021-12-22 VITALS — BP 105/61 | HR 65 | Temp 98.0°F | Resp 18 | Ht <= 58 in | Wt 205.2 lb

## 2021-12-22 DIAGNOSIS — I10 Essential (primary) hypertension: Secondary | ICD-10-CM

## 2021-12-22 DIAGNOSIS — E119 Type 2 diabetes mellitus without complications: Secondary | ICD-10-CM

## 2021-12-22 MED ORDER — GLIPIZIDE 5 MG PO TABS
2.5000 mg | ORAL_TABLET | Freq: Every day | ORAL | 2 refills | Status: DC
  Filled 2021-12-22 – 2022-01-10 (×2): qty 15, 30d supply, fill #0
  Filled 2022-02-12: qty 15, 30d supply, fill #1

## 2021-12-22 MED FILL — Losartan Potassium Tab 100 MG: ORAL | 90 days supply | Qty: 90 | Fill #0 | Status: AC

## 2021-12-22 NOTE — Progress Notes (Signed)
Established Patient Office Visit  Subjective   Patient ID: Lindsey Benjamin, female    DOB: May 17, 1966  Age: 56 y.o. MRN: [id number]  Chief Complaint  Patient presents with   Follow-up   Diabetes    Patient brought her meter and blood sugar log    HPI  Lindsey Benjamin is a 56 yo female who has history of hypothyroidism,hypertension, Asthma, hyperlipidemia,sleep apnea on Cpap, presents for routine follow up visit and lab review. Her HgbA1c done on 11/04/21 was 7.6% ad she was started on 2.5 mg glipizide since she declined Metformin. She brought her blood glucose log and it ranges between 83-130 mg/dl. She denies hypo/hyperglycemic symptoms, and neuropathy is improved with taking gabapentin. She states that she's compliant with her medications, denies side effects and continues to make healthy lifestyle changes. She reports intermittent shortness of breath smokes 1/2 pack and will be evaluated for COPD at [facility] on 01/18/22. Overall, she states that she's doing well and offers no further complaint.  Review of Systems  Constitutional: Negative.   Respiratory:  Positive for shortness of breath.   Cardiovascular: Negative.   Skin: Negative.       Objective:     BP 105/61 (BP Location: Right Arm, Patient Position: Sitting, Cuff Size: Large)   Pulse 65   Temp 98 F (36.7 C) (Oral)   Resp 18   Ht _0  (1.448 m)   Wt 205 lb 3.2 oz (93.1 kg)   LMP 07/15/2016 (Approximate)   SpO2 94%   BMI 44.40 kg/m  BP Readings from Last 3 Encounters:  12/22/21 105/61  11/11/21 (!) 149/80  11/04/21 (!) 178/97   Wt Readings from Last 3 Encounters:  12/22/21 205 lb 3.2 oz (93.1 kg)  11/11/21 207 lb 11.2 oz (94.2 kg)  11/04/21 207 lb 4.8 oz (94 kg)      Physical Exam   No results found for any visits on 12/22/21.  Last CBC Lab Results  Component Value Date   WBC 6.7 11/04/2021   HGB 15.6 11/04/2021   HCT 46.4 11/04/2021   MCV 89 11/04/2021   MCH 29.8 11/04/2021    RDW 14.3 11/04/2021   PLT 236 79/89/2119   Last metabolic panel Lab Results  Component Value Date   GLUCOSE 137 (H) 11/04/2021   NA 137 11/04/2021   K 4.3 11/04/2021   CL 96 11/04/2021   CO2 24 11/04/2021   BUN 13 11/04/2021   CREATININE 0.95 11/04/2021   EGFR 69 11/04/2021   CALCIUM 9.4 11/04/2021   PROT 7.4 11/04/2021   ALBUMIN 4.4 11/04/2021   LABGLOB 3.0 11/04/2021   AGRATIO 1.5 11/04/2021   BILITOT 0.4 11/04/2021   ALKPHOS 77 11/04/2021   AST 47 (H) 11/04/2021   ALT 52 (H) 11/04/2021   Last lipids Lab Results  Component Value Date   CHOL 195 06/10/2021   HDL 39 (L) 06/10/2021   LDLCALC 108 (H) 06/10/2021   TRIG 277 (H) 06/10/2021   CHOLHDL 5.0 (H) 06/10/2021   Last hemoglobin A1c Lab Results  Component Value Date   HGBA1C 7.6 (H) 11/04/2021   Last thyroid functions Lab Results  Component Value Date   TSH 2.520 11/04/2021   T4TOTAL 12.6 (H) 11/04/2021      The 10-year ASCVD risk score (Arnett DK, et al., 2019) is: 12.9%    Assessment & Plan:   1. Essential hypertension - Her blood pressure is under control, will continue on current medication, DASH diet and  exercises as tolerated.  2. Type 2 diabetes mellitus without complication, without long-term current use of insulin (HCC) - Her HgbA1c was 7.6%, her goal should be less than 7%. She will continue on current medication, low carb/non concentrated sweet diet and exercise as tolerated. - glipiZIDE (GLUCOTROL) 5 MG tablet; Take (1/2) tablet (2.5 mg total) by mouth once daily before breakfast.  Dispense: 15 tablet; Refill: 2    Return in about 7 weeks (around 02/09/2022), or if symptoms worsen or fail to improve.     Jerold Coombe, NP

## 2021-12-22 NOTE — Patient Instructions (Signed)
DASH Eating Plan DASH stands for Dietary Approaches to Stop Hypertension. The DASH eating plan is a healthy eating plan that has been shown to: Reduce high blood pressure (hypertension). Reduce your risk for type 2 diabetes, heart disease, and stroke. Help with weight loss. What are tips for following this plan? Reading food labels Check food labels for the amount of salt (sodium) per serving. Choose foods with less than 5 percent of the Daily Value of sodium. Generally, foods with less than 300 milligrams (mg) of sodium per serving fit into this eating plan. To find whole grains, look for the word "whole" as the first word in the ingredient list. Shopping Buy products labeled as "low-sodium" or "no salt added." Buy fresh foods. Avoid canned foods and pre-made or frozen meals. Cooking Avoid adding salt when cooking. Use salt-free seasonings or herbs instead of table salt or sea salt. Check with your health care provider or pharmacist before using salt substitutes. Do not fry foods. Cook foods using healthy methods such as baking, boiling, grilling, roasting, and broiling instead. Cook with heart-healthy oils, such as olive, canola, avocado, soybean, or sunflower oil. Meal planning  Eat a balanced diet that includes: 4 or more servings of fruits and 4 or more servings of vegetables each day. Try to fill one-half of your plate with fruits and vegetables. 6-8 servings of whole grains each day. Less than 6 oz (170 g) of lean meat, poultry, or fish each day. A 3-oz (85-g) serving of meat is about the same size as a deck of cards. One egg equals 1 oz (28 g). 2-3 servings of low-fat dairy each day. One serving is 1 cup (237 mL). 1 serving of nuts, seeds, or beans 5 times each week. 2-3 servings of heart-healthy fats. Healthy fats called omega-3 fatty acids are found in foods such as walnuts, flaxseeds, fortified milks, and eggs. These fats are also found in cold-water fish, such as sardines, salmon,  and mackerel. Limit how much you eat of: Canned or prepackaged foods. Food that is high in trans fat, such as some fried foods. Food that is high in saturated fat, such as fatty meat. Desserts and other sweets, sugary drinks, and other foods with added sugar. Full-fat dairy products. Do not salt foods before eating. Do not eat more than 4 egg yolks a week. Try to eat at least 2 vegetarian meals a week. Eat more home-cooked food and less restaurant, buffet, and fast food. Lifestyle When eating at a restaurant, ask that your food be prepared with less salt or no salt, if possible. If you drink alcohol: Limit how much you use to: 0-1 drink a day for women who are not pregnant. 0-2 drinks a day for men. Be aware of how much alcohol is in your drink. In the [place] one drink equals one 12 oz bottle of beer (355 mL), one 5 oz glass of wine (148 mL), or one 1 oz glass of hard liquor (44 mL). General information Avoid eating more than 2,300 mg of salt a day. If you have hypertension, you may need to reduce your sodium intake to 1,500 mg a day. Work with your health care provider to maintain a healthy body weight or to lose weight. Ask what an ideal weight is for you. Get at least 30 minutes of exercise that causes your heart to beat faster (aerobic exercise) most days of the week. Activities may include walking, swimming, or biking. Work with your health care provider or dietitian to   adjust your eating plan to your individual calorie needs. What foods should I eat? Fruits All fresh, dried, or frozen fruit. Canned fruit in natural juice (without added sugar). Vegetables Fresh or frozen vegetables (raw, steamed, roasted, or grilled). Low-sodium or reduced-sodium tomato and vegetable juice. Low-sodium or reduced-sodium tomato sauce and tomato paste. Low-sodium or reduced-sodium canned vegetables. Grains Whole-grain or whole-wheat bread. Whole-grain or whole-wheat pasta. Brown rice. Oatmeal. Quinoa.  Bulgur. Whole-grain and low-sodium cereals. Pita bread. Low-fat, low-sodium crackers. Whole-wheat flour tortillas. Meats and other proteins Skinless chicken or turkey. Ground chicken or turkey. Pork with fat trimmed off. Fish and seafood. Egg whites. Dried beans, peas, or lentils. Unsalted nuts, nut butters, and seeds. Unsalted canned beans. Lean cuts of beef with fat trimmed off. Low-sodium, lean precooked or cured meat, such as sausages or meat loaves. Dairy Low-fat (1%) or fat-free (skim) milk. Reduced-fat, low-fat, or fat-free cheeses. Nonfat, low-sodium ricotta or cottage cheese. Low-fat or nonfat yogurt. Low-fat, low-sodium cheese. Fats and oils Soft margarine without trans fats. Vegetable oil. Reduced-fat, low-fat, or light mayonnaise and salad dressings (reduced-sodium). Canola, safflower, olive, avocado, soybean, and sunflower oils. Avocado. Seasonings and condiments Herbs. Spices. Seasoning mixes without salt. Other foods Unsalted popcorn and pretzels. Fat-free sweets. The items listed above may not be a complete list of foods and beverages you can eat. Contact a dietitian for more information. What foods should I avoid? Fruits Canned fruit in a light or heavy syrup. Fried fruit. Fruit in cream or butter sauce. Vegetables Creamed or fried vegetables. Vegetables in a cheese sauce. Regular canned vegetables (not low-sodium or reduced-sodium). Regular canned tomato sauce and paste (not low-sodium or reduced-sodium). Regular tomato and vegetable juice (not low-sodium or reduced-sodium). Pickles. Olives. Grains Baked goods made with fat, such as croissants, muffins, or some breads. Dry pasta or rice meal packs. Meats and other proteins Fatty cuts of meat. Ribs. Fried meat. Bacon. Bologna, salami, and other precooked or cured meats, such as sausages or meat loaves. Fat from the back of a pig (fatback). Bratwurst. Salted nuts and seeds. Canned beans with added salt. Canned or smoked fish.  Whole eggs or egg yolks. Chicken or turkey with skin. Dairy Whole or 2% milk, cream, and half-and-half. Whole or full-fat cream cheese. Whole-fat or sweetened yogurt. Full-fat cheese. Nondairy creamers. Whipped toppings. Processed cheese and cheese spreads. Fats and oils Butter. Stick margarine. Lard. Shortening. Ghee. Bacon fat. Tropical oils, such as coconut, palm kernel, or palm oil. Seasonings and condiments Onion salt, garlic salt, seasoned salt, table salt, and sea salt. Worcestershire sauce. Tartar sauce. Barbecue sauce. Teriyaki sauce. Soy sauce, including reduced-sodium. Steak sauce. Canned and packaged gravies. Fish sauce. Oyster sauce. Cocktail sauce. Store-bought horseradish. Ketchup. Mustard. Meat flavorings and tenderizers. Bouillon cubes. Hot sauces. Pre-made or packaged marinades. Pre-made or packaged taco seasonings. Relishes. Regular salad dressings. Other foods Salted popcorn and pretzels. The items listed above may not be a complete list of foods and beverages you should avoid. Contact a dietitian for more information. Where to find more information National Heart, Lung, and Blood Institute: www.nhlbi.nih.gov American Heart Association: www.heart.org Academy of Nutrition and Dietetics: www.eatright.org National Kidney Foundation: www.kidney.org Summary The DASH eating plan is a healthy eating plan that has been shown to reduce high blood pressure (hypertension). It may also reduce your risk for type 2 diabetes, heart disease, and stroke. When on the DASH eating plan, aim to eat more fresh fruits and vegetables, whole grains, lean proteins, low-fat dairy, and heart-healthy fats. With the DASH   eating plan, you should limit salt (sodium) intake to 2,300 mg a day. If you have hypertension, you may need to reduce your sodium intake to 1,500 mg a day. Work with your health care provider or dietitian to adjust your eating plan to your individual calorie needs. This information is not  intended to replace advice given to you by your health care provider. Make sure you discuss any questions you have with your health care provider. Document Revised: 05/25/2019 Document Reviewed: 05/25/2019 Elsevier Patient Education  2023 Elsevier Inc. Carbohydrate Counting for Diabetes Mellitus, Adult Carbohydrate counting is a method of keeping track of how many carbohydrates you eat. Eating carbohydrates increases the amount of sugar (glucose) in the blood. Counting how many carbohydrates you eat improves how well you manage your blood glucose. This, in turn, helps you manage your diabetes. Carbohydrates are measured in grams (g) per serving. It is important to know how many carbohydrates (in grams or by serving size) you can have in each meal. This is different for every person. A dietitian can help you make a meal plan and calculate how many carbohydrates you should have at each meal and snack. What foods contain carbohydrates? Carbohydrates are found in the following foods: Grains, such as breads and cereals. Dried beans and soy products. Starchy vegetables, such as potatoes, peas, and corn. Fruit and fruit juices. Milk and yogurt. Sweets and snack foods, such as cake, cookies, candy, chips, and soft drinks. How do I count carbohydrates in foods? There are two ways to count carbohydrates in food. You can read food labels or learn standard serving sizes of foods. You can use either of these methods or a combination of both. Using the Nutrition Facts label The Nutrition Facts list is included on the labels of almost all packaged foods and beverages in the [place]. It includes: The serving size. Information about nutrients in each serving, including the grams of carbohydrate per serving. To use the Nutrition Facts, decide how many servings you will have. Then, multiply the number of servings by the number of carbohydrates per serving. The resulting number is the total grams of  carbohydrates that you will be having. Learning the standard serving sizes of foods When you eat carbohydrate foods that are not packaged or do not include Nutrition Facts on the label, you need to measure the servings in order to count the grams of carbohydrates. Measure the foods that you will eat with a food scale or measuring cup, if needed. Decide how many standard-size servings you will eat. Multiply the number of servings by 15. For foods that contain carbohydrates, one serving equals 15 g of carbohydrates. For example, if you eat 2 cups or 10 oz (300 g) of strawberries, you will have eaten 2 servings and 30 g of carbohydrates (2 servings x 15 g = 30 g). For foods that have more than one food mixed, such as soups and casseroles, you must count the carbohydrates in each food that is included. The following list contains standard serving sizes of common carbohydrate-rich foods. Each of these servings has about 15 g of carbohydrates: 1 slice of bread. 1 six-inch (15 cm) tortilla. ? cup or 2 oz (53 g) cooked rice or pasta.  cup or 3 oz (85 g) cooked or canned, drained and rinsed beans or lentils.  cup or 3 oz (85 g) starchy vegetable, such as peas, corn, or squash.  cup or 4 oz (120 g) hot cereal.  cup or 3 oz (85   g) boiled or mashed potatoes, or  or 3 oz (85 g) of a large baked potato.  cup or 4 fl oz (118 mL) fruit juice. 1 cup or 8 fl oz (237 mL) milk. 1 small or 4 oz (106 g) apple.  or 2 oz (63 g) of a medium banana. 1 cup or 5 oz (150 g) strawberries. 3 cups or 1 oz (28.3 g) popped popcorn. What is an example of carbohydrate counting? To calculate the grams of carbohydrates in this sample meal, follow the steps shown below. Sample meal 3 oz (85 g) chicken breast. ? cup or 4 oz (106 g) brown rice.  cup or 3 oz (85 g) corn. 1 cup or 8 fl oz (237 mL) milk. 1 cup or 5 oz (150 g) strawberries with sugar-free whipped topping. Carbohydrate calculation Identify the foods that  contain carbohydrates: Rice. Corn. Milk. Strawberries. Calculate how many servings you have of each food: 2 servings rice. 1 serving corn. 1 serving milk. 1 serving strawberries. Multiply each number of servings by 15 g: 2 servings rice x 15 g = 30 g. 1 serving corn x 15 g = 15 g. 1 serving milk x 15 g = 15 g. 1 serving strawberries x 15 g = 15 g. Add together all of the amounts to find the total grams of carbohydrates eaten: 30 g + 15 g + 15 g + 15 g = 75 g of carbohydrates total. What are tips for following this plan? Shopping Develop a meal plan and then make a shopping list. Buy fresh and frozen vegetables, fresh and frozen fruit, dairy, eggs, beans, lentils, and whole grains. Look at food labels. Choose foods that have more fiber and less sugar. Avoid processed foods and foods with added sugars. Meal planning Aim to have the same number of grams of carbohydrates at each meal and for each snack time. Plan to have regular, balanced meals and snacks. Where to find more information American Diabetes Association: diabetes.org Centers for Disease Control and Prevention: cdc.gov Academy of Nutrition and Dietetics: eatright.org Association of Diabetes Care & Education Specialists: diabeteseducator.org Summary Carbohydrate counting is a method of keeping track of how many carbohydrates you eat. Eating carbohydrates increases the amount of sugar (glucose) in your blood. Counting how many carbohydrates you eat improves how well you manage your blood glucose. This helps you manage your diabetes. A dietitian can help you make a meal plan and calculate how many carbohydrates you should have at each meal and snack. This information is not intended to replace advice given to you by your health care provider. Make sure you discuss any questions you have with your health care provider. Document Revised: 01/23/2020 Document Reviewed: 01/23/2020 Elsevier Patient Education  2023 Elsevier  Inc.  

## 2021-12-23 ENCOUNTER — Other Ambulatory Visit: Payer: Self-pay

## 2021-12-24 ENCOUNTER — Other Ambulatory Visit: Payer: Self-pay

## 2021-12-24 NOTE — Telephone Encounter (Signed)
Created in error

## 2021-12-25 ENCOUNTER — Other Ambulatory Visit: Payer: Self-pay

## 2021-12-25 MED FILL — Levothyroxine Sodium Tab 175 MCG: ORAL | 90 days supply | Qty: 90 | Fill #0 | Status: AC

## 2021-12-25 MED FILL — Levothyroxine Sodium Tab 175 MCG: ORAL | 90 days supply | Qty: 90 | Fill #2 | Status: CN

## 2021-12-28 ENCOUNTER — Other Ambulatory Visit: Payer: Self-pay

## 2021-12-29 ENCOUNTER — Other Ambulatory Visit: Payer: Self-pay

## 2021-12-29 MED ORDER — DULOXETINE HCL 30 MG PO CPEP
60.0000 mg | ORAL_CAPSULE | Freq: Every evening | ORAL | 3 refills | Status: DC
  Filled 2021-12-29: qty 60, 30d supply, fill #0

## 2021-12-29 MED ORDER — GABAPENTIN 600 MG PO TABS
ORAL_TABLET | ORAL | 2 refills | Status: DC
  Filled 2022-04-28: qty 30, 30d supply, fill #0
  Filled 2022-10-31: qty 30, 30d supply, fill #1

## 2021-12-29 MED ORDER — HYDROXYZINE HCL 10 MG PO TABS
ORAL_TABLET | ORAL | 2 refills | Status: DC
  Filled 2021-12-29 – 2022-04-28 (×2): qty 60, 30d supply, fill #0
  Filled 2022-10-31: qty 60, 30d supply, fill #1

## 2022-01-06 ENCOUNTER — Other Ambulatory Visit: Payer: Self-pay

## 2022-01-08 ENCOUNTER — Other Ambulatory Visit: Payer: Self-pay

## 2022-01-08 ENCOUNTER — Other Ambulatory Visit: Payer: Self-pay | Admitting: Gerontology

## 2022-01-08 MED ORDER — RIGHTEST GS550 BLOOD GLUCOSE VI STRP
ORAL_STRIP | 0 refills | Status: DC
  Filled 2022-01-08: qty 100, 25d supply, fill #0

## 2022-01-08 MED ORDER — RIGHTEST GL300 LANCETS MISC
0 refills | Status: DC
Start: 2022-01-08 — End: 2022-02-09
  Filled 2022-01-08: qty 100, 25d supply, fill #0

## 2022-01-10 ENCOUNTER — Other Ambulatory Visit: Payer: Self-pay

## 2022-01-11 ENCOUNTER — Other Ambulatory Visit: Payer: Self-pay

## 2022-01-12 ENCOUNTER — Other Ambulatory Visit: Payer: Self-pay

## 2022-01-12 MED ORDER — DULOXETINE HCL 60 MG PO CPEP
ORAL_CAPSULE | ORAL | 3 refills | Status: DC
  Filled 2022-02-03: qty 120, 120d supply, fill #0
  Filled 2022-07-13: qty 120, 120d supply, fill #1
  Filled 2022-10-31: qty 120, 120d supply, fill #2

## 2022-01-18 ENCOUNTER — Other Ambulatory Visit: Payer: Self-pay

## 2022-01-20 ENCOUNTER — Other Ambulatory Visit: Payer: Self-pay

## 2022-02-03 ENCOUNTER — Other Ambulatory Visit: Payer: Self-pay

## 2022-02-08 ENCOUNTER — Other Ambulatory Visit (HOSPITAL_COMMUNITY): Payer: Self-pay

## 2022-02-09 ENCOUNTER — Other Ambulatory Visit: Payer: Self-pay | Admitting: Gerontology

## 2022-02-09 ENCOUNTER — Ambulatory Visit: Payer: Self-pay | Admitting: Gerontology

## 2022-02-09 ENCOUNTER — Other Ambulatory Visit: Payer: Self-pay

## 2022-02-09 MED ORDER — RIGHTEST GL300 LANCETS MISC
0 refills | Status: DC
  Filled 2022-02-09: qty 100, 25d supply, fill #0

## 2022-02-09 MED ORDER — RIGHTEST GS550 BLOOD GLUCOSE VI STRP
ORAL_STRIP | 0 refills | Status: DC
  Filled 2022-02-09: qty 100, 25d supply, fill #0

## 2022-02-12 ENCOUNTER — Other Ambulatory Visit: Payer: Self-pay

## 2022-02-23 ENCOUNTER — Other Ambulatory Visit: Payer: Self-pay

## 2022-02-23 ENCOUNTER — Ambulatory Visit: Payer: Self-pay | Admitting: Gerontology

## 2022-02-23 ENCOUNTER — Encounter: Payer: Self-pay | Admitting: Gerontology

## 2022-02-23 VITALS — BP 155/97 | HR 75 | Temp 98.1°F | Ht <= 58 in | Wt 199.1 lb

## 2022-02-23 DIAGNOSIS — E119 Type 2 diabetes mellitus without complications: Secondary | ICD-10-CM

## 2022-02-23 DIAGNOSIS — I10 Essential (primary) hypertension: Secondary | ICD-10-CM

## 2022-02-23 LAB — POCT GLYCOSYLATED HEMOGLOBIN (HGB A1C): Hemoglobin A1C: 7.3 % — AB (ref 4.0–5.6)

## 2022-02-23 LAB — GLUCOSE, POCT (MANUAL RESULT ENTRY): POC Glucose: 93 mg/dl (ref 70–99)

## 2022-02-23 MED ORDER — GLIPIZIDE 5 MG PO TABS
2.5000 mg | ORAL_TABLET | Freq: Every day | ORAL | 2 refills | Status: DC
  Filled 2022-02-23 – 2022-04-28 (×2): qty 15, 30d supply, fill #0

## 2022-02-23 NOTE — Progress Notes (Signed)
Established Patient Office Visit  Subjective   Patient ID: Lindsey Benjamin, female    DOB: 1965-11-07  Age: 56 y.o. MRN: [id number]  Chief Complaint  Patient presents with   Follow-up    HPI  Lindsey Benjamin is a 56 yo female who has history of hypothyroidism,hypertension, Asthma, hyperlipidemia,sleep apnea on Cpap, presents for routine follow up visit and lab review.  Her HgbA1c done during visit decreased from 7.6% to 7.3%. She is compliant with her medications, denies side effects and continues to make healthy lifestyle changes. She checks her blood gluocse qid, her fasting reading was 98 mg/dl and it was 93 mg/dl during visit. She denies hypo/hyperglycemic symptoms, peripheral neuropathy is controlled with taking gabapentin and she performs daily foot checks. She was seen at [facility]  for evaluation of COPD,She does not show obstruction but is certainly at risk given her smoking hx. She has many other factors that limit her activities such as her obesity and neuropathy. Encouraged patient to continue to follow with neurology clinic for her OSA per Dr Marylin Crosby. Overall, she states that she's doing well and offers no further complaint.  Review of Systems  Constitutional: Negative.   Respiratory: Negative.    Cardiovascular: Negative.   Skin: Negative.   Neurological: Negative.   Endo/Heme/Allergies: Negative.   Psychiatric/Behavioral: Negative.        Objective:     BP (!) 155/97 (BP Location: Right Arm, Patient Position: Sitting, Cuff Size: Small)   Pulse 75   Temp 98.1 F (36.7 C) (Oral)   Ht '4\' 9"'  (1.448 m)   Wt 199 lb 1.6 oz (90.3 kg)   LMP 07/15/2016 (Approximate)   SpO2 93%   BMI 43.08 kg/m  BP Readings from Last 3 Encounters:  02/23/22 (!) 155/97  12/22/21 105/61  11/11/21 (!) 149/80   Wt Readings from Last 3 Encounters:  02/23/22 199 lb 1.6 oz (90.3 kg)  12/22/21 205 lb 3.2 oz (93.1 kg)  11/11/21 207 lb 11.2 oz (94.2 kg)   Encouraged  weight loss    Physical Exam HENT:     Head: Normocephalic and atraumatic.     Mouth/Throat:     Mouth: Mucous membranes are moist.  Eyes:     Extraocular Movements: Extraocular movements intact.     Conjunctiva/sclera: Conjunctivae normal.     Pupils: Pupils are equal, round, and reactive to light.  Cardiovascular:     Rate and Rhythm: Normal rate and regular rhythm.     Pulses: Normal pulses.     Heart sounds: Normal heart sounds.  Pulmonary:     Effort: Pulmonary effort is normal.     Breath sounds: Normal breath sounds.  Skin:    General: Skin is warm.  Neurological:     General: No focal deficit present.     Mental Status: She is alert and oriented to person, place, and time. Mental status is at baseline.  Psychiatric:        Mood and Affect: Mood normal.        Behavior: Behavior normal.        Thought Content: Thought content normal.        Judgment: Judgment normal.      Results for orders placed or performed in visit on 02/23/22  POCT Glucose (CBG)  Result Value Ref Range   POC Glucose 93 70 - 99 mg/dl  POCT HgB A1C  Result Value Ref Range   Hemoglobin A1C 7.3 (A) 4.0 -  5.6 %   HbA1c POC (<> result, manual entry)     HbA1c, POC (prediabetic range)     HbA1c, POC (controlled diabetic range)      Last CBC Lab Results  Component Value Date   WBC 6.7 11/04/2021   HGB 15.6 11/04/2021   HCT 46.4 11/04/2021   MCV 89 11/04/2021   MCH 29.8 11/04/2021   RDW 14.3 11/04/2021   PLT 236 08/65/7846   Last metabolic panel Lab Results  Component Value Date   GLUCOSE 137 (H) 11/04/2021   NA 137 11/04/2021   K 4.3 11/04/2021   CL 96 11/04/2021   CO2 24 11/04/2021   BUN 13 11/04/2021   CREATININE 0.95 11/04/2021   EGFR 69 11/04/2021   CALCIUM 9.4 11/04/2021   PROT 7.4 11/04/2021   ALBUMIN 4.4 11/04/2021   LABGLOB 3.0 11/04/2021   AGRATIO 1.5 11/04/2021   BILITOT 0.4 11/04/2021   ALKPHOS 77 11/04/2021   AST 47 (H) 11/04/2021   ALT 52 (H) 11/04/2021    Last lipids Lab Results  Component Value Date   CHOL 195 06/10/2021   HDL 39 (L) 06/10/2021   LDLCALC 108 (H) 06/10/2021   TRIG 277 (H) 06/10/2021   CHOLHDL 5.0 (H) 06/10/2021   Last hemoglobin A1c Lab Results  Component Value Date   HGBA1C 7.3 (A) 02/23/2022   Last thyroid functions Lab Results  Component Value Date   TSH 2.520 11/04/2021   T4TOTAL 12.6 (H) 11/04/2021   Last vitamin D No results found for: "25OHVITD2", "25OHVITD3", "VD25OH" Last vitamin B12 and Folate No results found for: "VITAMINB12", "FOLATE"    The 10-year ASCVD risk score (Arnett DK, et al., 2019) is: 26.2%    Assessment & Plan:   1. Type 2 diabetes mellitus without complication, without long-term current use of insulin (HCC) - Her Diabetes is improving, HgbA1c was 7.3% and her goal should be less than 7%. She will continue on current medication, low carb/non concentrated sweet diet and exercise as tolerated. - POCT HgB A1C; Future - POCT Glucose (CBG); Future - POCT Glucose (CBG) - POCT HgB A1C - glipiZIDE (GLUCOTROL) 5 MG tablet; Take (1/2) tablet (2.5 mg total) by mouth once daily before breakfast.  Dispense: 15 tablet; Refill: 2  2. Essential hypertension - Her blood pressure is not under control, her goal should be less than 140/90, she will continue on current medication, DASH diet and exercise as tolerated. She was encouraged to follow up with Cardiologist.    Return in about 3 months (around 05/26/2022), or if symptoms worsen or fail to improve.     Jerold Coombe, NP

## 2022-02-23 NOTE — Patient Instructions (Signed)
Carbohydrate Counting for Diabetes Mellitus, Adult Carbohydrate counting is a method of keeping track of how many carbohydrates you eat. Eating carbohydrates increases the amount of sugar (glucose) in the blood. Counting how many carbohydrates you eat improves how well you manage your blood glucose. This, in turn, helps you manage your diabetes. Carbohydrates are measured in grams (g) per serving. It is important to know how many carbohydrates (in grams or by serving size) you can have in each meal. This is different for every person. A dietitian can help you make a meal plan and calculate how many carbohydrates you should have at each meal and snack. What foods contain carbohydrates? Carbohydrates are found in the following foods: Grains, such as breads and cereals. Dried beans and soy products. Starchy vegetables, such as potatoes, peas, and corn. Fruit and fruit juices. Milk and yogurt. Sweets and snack foods, such as cake, cookies, candy, chips, and soft drinks. How do I count carbohydrates in foods? There are two ways to count carbohydrates in food. You can read food labels or learn standard serving sizes of foods. You can use either of these methods or a combination of both. Using the Nutrition Facts label The Nutrition Facts list is included on the labels of almost all packaged foods and beverages in the [place]. It includes: The serving size. Information about nutrients in each serving, including the grams of carbohydrate per serving. To use the Nutrition Facts, decide how many servings you will have. Then, multiply the number of servings by the number of carbohydrates per serving. The resulting number is the total grams of carbohydrates that you will be having. Learning the standard serving sizes of foods When you eat carbohydrate foods that are not packaged or do not include Nutrition Facts on the label, you need to measure the servings in order to count the grams of  carbohydrates. Measure the foods that you will eat with a food scale or measuring cup, if needed. Decide how many standard-size servings you will eat. Multiply the number of servings by 15. For foods that contain carbohydrates, one serving equals 15 g of carbohydrates. For example, if you eat 2 cups or 10 oz (300 g) of strawberries, you will have eaten 2 servings and 30 g of carbohydrates (2 servings x 15 g = 30 g). For foods that have more than one food mixed, such as soups and casseroles, you must count the carbohydrates in each food that is included. The following list contains standard serving sizes of common carbohydrate-rich foods. Each of these servings has about 15 g of carbohydrates: 1 slice of bread. 1 six-inch (15 cm) tortilla. ? cup or 2 oz (53 g) cooked rice or pasta.  cup or 3 oz (85 g) cooked or canned, drained and rinsed beans or lentils.  cup or 3 oz (85 g) starchy vegetable, such as peas, corn, or squash.  cup or 4 oz (120 g) hot cereal.  cup or 3 oz (85 g) boiled or mashed potatoes, or  or 3 oz (85 g) of a large baked potato.  cup or 4 fl oz (118 mL) fruit juice. 1 cup or 8 fl oz (237 mL) milk. 1 small or 4 oz (106 g) apple.  or 2 oz (63 g) of a medium banana. 1 cup or 5 oz (150 g) strawberries. 3 cups or 1 oz (28.3 g) popped popcorn. What is an example of carbohydrate counting? To calculate the grams of carbohydrates in this sample meal, follow the steps   shown below. Sample meal 3 oz (85 g) chicken breast. ? cup or 4 oz (106 g) brown rice.  cup or 3 oz (85 g) corn. 1 cup or 8 fl oz (237 mL) milk. 1 cup or 5 oz (150 g) strawberries with sugar-free whipped topping. Carbohydrate calculation Identify the foods that contain carbohydrates: Rice. Corn. Milk. Strawberries. Calculate how many servings you have of each food: 2 servings rice. 1 serving corn. 1 serving milk. 1 serving strawberries. Multiply each number of servings by 15 g: 2 servings rice x 15  g = 30 g. 1 serving corn x 15 g = 15 g. 1 serving milk x 15 g = 15 g. 1 serving strawberries x 15 g = 15 g. Add together all of the amounts to find the total grams of carbohydrates eaten: 30 g + 15 g + 15 g + 15 g = 75 g of carbohydrates total. What are tips for following this plan? Shopping Develop a meal plan and then make a shopping list. Buy fresh and frozen vegetables, fresh and frozen fruit, dairy, eggs, beans, lentils, and whole grains. Look at food labels. Choose foods that have more fiber and less sugar. Avoid processed foods and foods with added sugars. Meal planning Aim to have the same number of grams of carbohydrates at each meal and for each snack time. Plan to have regular, balanced meals and snacks. Where to find more information American Diabetes Association: diabetes.org Centers for Disease Control and Prevention: cdc.gov Academy of Nutrition and Dietetics: eatright.org Association of Diabetes Care & Education Specialists: diabeteseducator.org Summary Carbohydrate counting is a method of keeping track of how many carbohydrates you eat. Eating carbohydrates increases the amount of sugar (glucose) in your blood. Counting how many carbohydrates you eat improves how well you manage your blood glucose. This helps you manage your diabetes. A dietitian can help you make a meal plan and calculate how many carbohydrates you should have at each meal and snack. This information is not intended to replace advice given to you by your health care provider. Make sure you discuss any questions you have with your health care provider. Document Revised: 01/23/2020 Document Reviewed: 01/23/2020 Elsevier Patient Education  2023 Elsevier Inc. DASH Eating Plan DASH stands for Dietary Approaches to Stop Hypertension. The DASH eating plan is a healthy eating plan that has been shown to: Reduce high blood pressure (hypertension). Reduce your risk for type 2 diabetes, heart disease, and  stroke. Help with weight loss. What are tips for following this plan? Reading food labels Check food labels for the amount of salt (sodium) per serving. Choose foods with less than 5 percent of the Daily Value of sodium. Generally, foods with less than 300 milligrams (mg) of sodium per serving fit into this eating plan. To find whole grains, look for the word "whole" as the first word in the ingredient list. Shopping Buy products labeled as "low-sodium" or "no salt added." Buy fresh foods. Avoid canned foods and pre-made or frozen meals. Cooking Avoid adding salt when cooking. Use salt-free seasonings or herbs instead of table salt or sea salt. Check with your health care provider or pharmacist before using salt substitutes. Do not fry foods. Cook foods using healthy methods such as baking, boiling, grilling, roasting, and broiling instead. Cook with heart-healthy oils, such as olive, canola, avocado, soybean, or sunflower oil. Meal planning  Eat a balanced diet that includes: 4 or more servings of fruits and 4 or more servings of vegetables each day.   Try to fill one-half of your plate with fruits and vegetables. 6-8 servings of whole grains each day. Less than 6 oz (170 g) of lean meat, poultry, or fish each day. A 3-oz (85-g) serving of meat is about the same size as a deck of cards. One egg equals 1 oz (28 g). 2-3 servings of low-fat dairy each day. One serving is 1 cup (237 mL). 1 serving of nuts, seeds, or beans 5 times each week. 2-3 servings of heart-healthy fats. Healthy fats called omega-3 fatty acids are found in foods such as walnuts, flaxseeds, fortified milks, and eggs. These fats are also found in cold-water fish, such as sardines, salmon, and mackerel. Limit how much you eat of: Canned or prepackaged foods. Food that is high in trans fat, such as some fried foods. Food that is high in saturated fat, such as fatty meat. Desserts and other sweets, sugary drinks, and other foods  with added sugar. Full-fat dairy products. Do not salt foods before eating. Do not eat more than 4 egg yolks a week. Try to eat at least 2 vegetarian meals a week. Eat more home-cooked food and less restaurant, buffet, and fast food. Lifestyle When eating at a restaurant, ask that your food be prepared with less salt or no salt, if possible. If you drink alcohol: Limit how much you use to: 0-1 drink a day for women who are not pregnant. 0-2 drinks a day for men. Be aware of how much alcohol is in your drink. In the [place] one drink equals one 12 oz bottle of beer (355 mL), one 5 oz glass of wine (148 mL), or one 1 oz glass of hard liquor (44 mL). General information Avoid eating more than 2,300 mg of salt a day. If you have hypertension, you may need to reduce your sodium intake to 1,500 mg a day. Work with your health care provider to maintain a healthy body weight or to lose weight. Ask what an ideal weight is for you. Get at least 30 minutes of exercise that causes your heart to beat faster (aerobic exercise) most days of the week. Activities may include walking, swimming, or biking. Work with your health care provider or dietitian to adjust your eating plan to your individual calorie needs. What foods should I eat? Fruits All fresh, dried, or frozen fruit. Canned fruit in natural juice (without added sugar). Vegetables Fresh or frozen vegetables (raw, steamed, roasted, or grilled). Low-sodium or reduced-sodium tomato and vegetable juice. Low-sodium or reduced-sodium tomato sauce and tomato paste. Low-sodium or reduced-sodium canned vegetables. Grains Whole-grain or whole-wheat bread. Whole-grain or whole-wheat pasta. Brown rice. Oatmeal. Quinoa. Bulgur. Whole-grain and low-sodium cereals. Pita bread. Low-fat, low-sodium crackers. Whole-wheat flour tortillas. Meats and other proteins Skinless chicken or turkey. Ground chicken or turkey. Pork with fat trimmed off. Fish and seafood. Egg  whites. Dried beans, peas, or lentils. Unsalted nuts, nut butters, and seeds. Unsalted canned beans. Lean cuts of beef with fat trimmed off. Low-sodium, lean precooked or cured meat, such as sausages or meat loaves. Dairy Low-fat (1%) or fat-free (skim) milk. Reduced-fat, low-fat, or fat-free cheeses. Nonfat, low-sodium ricotta or cottage cheese. Low-fat or nonfat yogurt. Low-fat, low-sodium cheese. Fats and oils Soft margarine without trans fats. Vegetable oil. Reduced-fat, low-fat, or light mayonnaise and salad dressings (reduced-sodium). Canola, safflower, olive, avocado, soybean, and sunflower oils. Avocado. Seasonings and condiments Herbs. Spices. Seasoning mixes without salt. Other foods Unsalted popcorn and pretzels. Fat-free sweets. The items listed above may not be   a complete list of foods and beverages you can eat. Contact a dietitian for more information. What foods should I avoid? Fruits Canned fruit in a light or heavy syrup. Fried fruit. Fruit in cream or butter sauce. Vegetables Creamed or fried vegetables. Vegetables in a cheese sauce. Regular canned vegetables (not low-sodium or reduced-sodium). Regular canned tomato sauce and paste (not low-sodium or reduced-sodium). Regular tomato and vegetable juice (not low-sodium or reduced-sodium). Pickles. Olives. Grains Baked goods made with fat, such as croissants, muffins, or some breads. Dry pasta or rice meal packs. Meats and other proteins Fatty cuts of meat. Ribs. Fried meat. Bacon. Bologna, salami, and other precooked or cured meats, such as sausages or meat loaves. Fat from the back of a pig (fatback). Bratwurst. Salted nuts and seeds. Canned beans with added salt. Canned or smoked fish. Whole eggs or egg yolks. Chicken or turkey with skin. Dairy Whole or 2% milk, cream, and half-and-half. Whole or full-fat cream cheese. Whole-fat or sweetened yogurt. Full-fat cheese. Nondairy creamers. Whipped toppings. Processed cheese and  cheese spreads. Fats and oils Butter. Stick margarine. Lard. Shortening. Ghee. Bacon fat. Tropical oils, such as coconut, palm kernel, or palm oil. Seasonings and condiments Onion salt, garlic salt, seasoned salt, table salt, and sea salt. Worcestershire sauce. Tartar sauce. Barbecue sauce. Teriyaki sauce. Soy sauce, including reduced-sodium. Steak sauce. Canned and packaged gravies. Fish sauce. Oyster sauce. Cocktail sauce. Store-bought horseradish. Ketchup. Mustard. Meat flavorings and tenderizers. Bouillon cubes. Hot sauces. Pre-made or packaged marinades. Pre-made or packaged taco seasonings. Relishes. Regular salad dressings. Other foods Salted popcorn and pretzels. The items listed above may not be a complete list of foods and beverages you should avoid. Contact a dietitian for more information. Where to find more information National Heart, Lung, and Blood Institute: www.nhlbi.nih.gov American Heart Association: www.heart.org Academy of Nutrition and Dietetics: www.eatright.org National Kidney Foundation: www.kidney.org Summary The DASH eating plan is a healthy eating plan that has been shown to reduce high blood pressure (hypertension). It may also reduce your risk for type 2 diabetes, heart disease, and stroke. When on the DASH eating plan, aim to eat more fresh fruits and vegetables, whole grains, lean proteins, low-fat dairy, and heart-healthy fats. With the DASH eating plan, you should limit salt (sodium) intake to 2,300 mg a day. If you have hypertension, you may need to reduce your sodium intake to 1,500 mg a day. Work with your health care provider or dietitian to adjust your eating plan to your individual calorie needs. This information is not intended to replace advice given to you by your health care provider. Make sure you discuss any questions you have with your health care provider. Document Revised: 05/25/2019 Document Reviewed: 05/25/2019 Elsevier Patient Education  2023  Elsevier Inc.  

## 2022-04-05 NOTE — [facility] Nurse Program (Unsigned)
Client Dept: [facility] Nurse Program Note  Date of Encounter: 04/05/2022 Spoke with Client on phone she was requesting information about flu shot administration sites.   Past Medical History: Past Medical History:  Diagnosis Date   Asthma    Hypertension    Hypothyroidism    Type 2 diabetes mellitus without complication, without long-term current use of insulin ([place]) 03/04/2021    Encounter Details:  CNP Questionnaire - 04/05/22 1105       Questionnaire   Ask client: Do you give verbal consent for me to treat you today? Yes    Student Assistance N/A    Location Patient [place]    Visit Setting with Client Phone/Text/Email    Patient Status Unknown    Insurance Unknown    Insurance/Financial Assistance Referral N/A    Medication Have Medication Insecurities    Medical Provider Yes    Screening Referrals Made N/A    Medical Referrals Made N/A    Medical Appointment Made N/A    Recently w/o PCP, now 1st time PCP visit completed due to CNs referral or appointment made N/A    Food Have Food Insecurities    Transportation Need transportation assistance    Housing/Utilities N/A    Interpersonal Safety N/A    Interventions N/A    Abnormal to Normal Screening Since Last CN Visit N/A    Did client attend any of the following based off CNs referral or appointments made? N/A    ED Visit Averted N/A    Life-Saving Intervention Made N/A

## 2022-04-28 ENCOUNTER — Other Ambulatory Visit: Payer: Self-pay

## 2022-04-28 ENCOUNTER — Other Ambulatory Visit: Payer: Self-pay | Admitting: Gerontology

## 2022-04-28 MED FILL — Levothyroxine Sodium Tab 175 MCG: ORAL | 90 days supply | Qty: 90 | Fill #1 | Status: AC

## 2022-04-28 MED FILL — Losartan Potassium Tab 100 MG: ORAL | 90 days supply | Qty: 90 | Fill #1 | Status: AC

## 2022-04-29 ENCOUNTER — Other Ambulatory Visit: Payer: Self-pay

## 2022-04-29 MED ORDER — LANSOPRAZOLE 30 MG PO CPDR
DELAYED_RELEASE_CAPSULE | ORAL | 3 refills | Status: DC
  Filled 2022-04-29: qty 90, 90d supply, fill #0
  Filled 2022-10-31: qty 90, 90d supply, fill #1

## 2022-04-30 ENCOUNTER — Other Ambulatory Visit: Payer: Self-pay

## 2022-05-04 ENCOUNTER — Other Ambulatory Visit: Payer: Self-pay

## 2022-05-13 ENCOUNTER — Ambulatory Visit: Payer: Self-pay

## 2022-05-13 DIAGNOSIS — Z23 Encounter for immunization: Secondary | ICD-10-CM

## 2022-05-26 ENCOUNTER — Emergency Department: Payer: Self-pay

## 2022-05-26 ENCOUNTER — Emergency Department
Admission: EM | Admit: 2022-05-26 | Discharge: 2022-05-26 | Disposition: A | Discharge status: Home / Self Care | Payer: Self-pay | Attending: Emergency Medicine | Admitting: Emergency Medicine

## 2022-05-26 ENCOUNTER — Other Ambulatory Visit: Payer: Self-pay

## 2022-05-26 ENCOUNTER — Ambulatory Visit: Payer: Self-pay | Admitting: Gerontology

## 2022-05-26 DIAGNOSIS — I10 Essential (primary) hypertension: Secondary | ICD-10-CM | POA: Insufficient documentation

## 2022-05-26 DIAGNOSIS — Z91198 Patient's noncompliance with other medical treatment and regimen for other reason: Secondary | ICD-10-CM | POA: Insufficient documentation

## 2022-05-26 DIAGNOSIS — K5900 Constipation, unspecified: Secondary | ICD-10-CM | POA: Insufficient documentation

## 2022-05-26 DIAGNOSIS — Z91199 Patient's noncompliance with other medical treatment and regimen due to unspecified reason: Secondary | ICD-10-CM

## 2022-05-26 MED ORDER — ATENOLOL 25 MG PO TABS
50.0000 mg | ORAL_TABLET | Freq: Once | ORAL | Status: AC
  Administered 2022-05-26: 50 mg via ORAL
  Filled 2022-05-26: qty 2

## 2022-05-26 NOTE — ED Triage Notes (Addendum)
Pt presents to ED with c/o of constipation. Pt states BM yesterday. Pt states she has not taken any OTC meds.   Pt hypertensive and state she has not taken her meds in a month "because". Pt states HX of hypertension.

## 2022-05-26 NOTE — ED Provider Notes (Signed)
[facility] Provider Note    Event Date/Time   First MD Initiated Contact with Patient 05/26/22 1058     (approximate)   History   Constipation   HPI  Lindsey Benjamin is a 56 y.o. female   presents to the ED with complaint of constipation.  Patient states she had a bowel movement yesterday but feels as if she is still is constipated.  Patient does not take any over-the-counter medications for this.  Also she was noted to be hypertensive in triage and asked about her medication.  Patient states that she has not taken her medication in a month but does not give a specific reason why.  Records indicate patient has refills on her medication and she acknowledges that she knows this.  She denies any headache, vision changes, dizziness, shortness of breath, chest pain with her elevated blood pressure.  Denies history of hypertension, hypothyroidism, mental health problems, sleep apnea, diabetes type 2.      Physical Exam   Triage Vital Signs: ED Triage Vitals  Enc Vitals Group     BP 05/26/22 1036 (!) 215/99     Pulse Rate 05/26/22 1036 65     Resp 05/26/22 1036 17     Temp 05/26/22 1037 (!) 97.5 F (36.4 C)     Temp Source 05/26/22 1037 Oral     SpO2 05/26/22 1036 95 %     Weight 05/26/22 1102 199 lb 1.2 oz (90.3 kg)     Height 05/26/22 1102 4\' 9"  (1.448 m)     Head Circumference --      Peak Flow --      Pain Score 05/26/22 1038 10     Pain Loc --      Pain Edu? --      Excl. in GC? --     Most recent vital signs: Vitals:   05/26/22 1037 05/26/22 1300  BP:  (!) 165/100  Pulse:  68  Resp:  16  Temp: (!) 97.5 F (36.4 C)   SpO2:  97%     General: Awake, no distress.  Alert, cooperative and talkative. CV:  Good peripheral perfusion.  Heart regular rate and rhythm. Resp:  Normal effort.  Lungs are clear bilaterally. Abd:  No distention.  Soft, nontender, bowel sounds are present x 4 quadrants. Other:  No CVA tenderness.   ED Results /  Procedures / Treatments   Labs (all labs ordered are listed, but only abnormal results are displayed) Labs Reviewed  URINALYSIS, ROUTINE W REFLEX MICROSCOPIC      RADIOLOGY X-ray 1 view abdomen shows stool burden interpreted and reviewed by myself.  Radiology report shows mild stool burden.    PROCEDURES:  Critical Care performed:   Procedures   MEDICATIONS ORDERED IN ED: Medications  atenolol (TENORMIN) tablet 50 mg (50 mg Oral Given 05/26/22 1133)     IMPRESSION / MDM / ASSESSMENT AND PLAN / ED COURSE  I reviewed the triage vital signs and the nursing notes.   Differential diagnosis includes, but is not limited to, constipation, hypertension, medically noncompliant with hypertension and medications.  56 year old female presents to the ED via EMS with complaint of constipation.  It was noted in triage that her blood pressure was 215/99 and patient stated that she had not taken her blood pressure medication a month even though she has medication and also has refills on her prescription.  Her main complaint is constipation.  Patient was given a atenolol while  in the emergency department and blood pressure prior to discharge was 165/100.  While in the x-ray department patient apparently had a large bowel movement which has given her relief.  Patient is strongly encouraged to take her blood pressure medication every day to avoid stroke or heart attack.  She also was encouraged to follow-up with her primary care provider and have her blood pressure rechecked.      Patient's presentation is most consistent with acute complicated illness / injury requiring diagnostic workup.  FINAL CLINICAL IMPRESSION(S) / ED DIAGNOSES   Final diagnoses:  Hypertension, uncontrolled  Medically noncompliant  Constipation, unspecified constipation type     Rx / DC Orders   ED Discharge Orders     None        Note:  This document was prepared using Dragon voice recognition software  and may include unintentional dictation errors.   Tommi Rumps, PA-C 05/26/22 1349    Corena Herter, MD 05/26/22 1410

## 2022-05-26 NOTE — Discharge Instructions (Signed)
Follow-up with your primary care provider if any continued problems and also make sure you are taking your blood pressure medication every day.  Uncontrolled blood pressure can lead to a heart attack or stroke. Increase fluids, vegetables, fruits.

## 2022-05-26 NOTE — ED Notes (Signed)
See triage note  Presents with some problems with constipation  States she feels like it is  almost ready to come out  but can't  Also is concerned about her b/p being elevated

## 2022-06-03 ENCOUNTER — Other Ambulatory Visit: Payer: Self-pay

## 2022-06-09 ENCOUNTER — Ambulatory Visit: Payer: Self-pay | Admitting: Gerontology

## 2022-07-07 ENCOUNTER — Ambulatory Visit: Payer: Self-pay | Admitting: Gerontology

## 2022-07-13 ENCOUNTER — Other Ambulatory Visit: Payer: Self-pay

## 2022-07-30 ENCOUNTER — Other Ambulatory Visit: Payer: Self-pay

## 2022-08-04 ENCOUNTER — Other Ambulatory Visit: Payer: Self-pay

## 2022-08-04 ENCOUNTER — Ambulatory Visit: Payer: Self-pay | Admitting: Gerontology

## 2022-08-04 ENCOUNTER — Encounter: Payer: Self-pay | Admitting: Gerontology

## 2022-08-04 VITALS — BP 157/92 | HR 56 | Temp 97.4°F | Resp 18 | Ht <= 58 in | Wt 210.7 lb

## 2022-08-04 DIAGNOSIS — M7989 Other specified soft tissue disorders: Secondary | ICD-10-CM

## 2022-08-04 DIAGNOSIS — E119 Type 2 diabetes mellitus without complications: Secondary | ICD-10-CM

## 2022-08-04 DIAGNOSIS — I10 Essential (primary) hypertension: Secondary | ICD-10-CM

## 2022-08-04 DIAGNOSIS — E039 Hypothyroidism, unspecified: Secondary | ICD-10-CM

## 2022-08-04 LAB — POCT GLYCOSYLATED HEMOGLOBIN (HGB A1C): Hemoglobin A1C: 7.5 % — AB (ref 4.0–5.6)

## 2022-08-04 LAB — GLUCOSE, POCT (MANUAL RESULT ENTRY): POC Glucose: 92 mg/dl (ref 70–99)

## 2022-08-04 MED ORDER — LEVOTHYROXINE SODIUM 175 MCG PO TABS
175.0000 ug | ORAL_TABLET | Freq: Every day | ORAL | 3 refills | Status: DC
  Filled 2022-08-04: qty 90, 90d supply, fill #0

## 2022-08-04 MED ORDER — ATENOLOL 50 MG PO TABS
50.0000 mg | ORAL_TABLET | Freq: Every day | ORAL | 3 refills | Status: DC
  Filled 2022-08-04: qty 90, 90d supply, fill #0

## 2022-08-04 MED ORDER — GLIPIZIDE 5 MG PO TABS
5.0000 mg | ORAL_TABLET | Freq: Every day | ORAL | 0 refills | Status: DC
  Filled 2022-08-04: qty 90, 90d supply, fill #0

## 2022-08-04 NOTE — Patient Instructions (Signed)
DASH Eating Plan DASH stands for Dietary Approaches to Stop Hypertension. The DASH eating plan is a healthy eating plan that has been shown to: Reduce high blood pressure (hypertension). Reduce your risk for type 2 diabetes, heart disease, and stroke. Help with weight loss. What are tips for following this plan? Reading food labels Check food labels for the amount of salt (sodium) per serving. Choose foods with less than 5 percent of the Daily Value of sodium. Generally, foods with less than 300 milligrams (mg) of sodium per serving fit into this eating plan. To find whole grains, look for the word "whole" as the first word in the ingredient list. Shopping Buy products labeled as "low-sodium" or "no salt added." Buy fresh foods. Avoid canned foods and pre-made or frozen meals. Cooking Avoid adding salt when cooking. Use salt-free seasonings or herbs instead of table salt or sea salt. Check with your health care provider or pharmacist before using salt substitutes. Do not fry foods. Cook foods using healthy methods such as baking, boiling, grilling, roasting, and broiling instead. Cook with heart-healthy oils, such as olive, canola, avocado, soybean, or sunflower oil. Meal planning  Eat a balanced diet that includes: 4 or more servings of fruits and 4 or more servings of vegetables each day. Try to fill one-half of your plate with fruits and vegetables. 6-8 servings of whole grains each day. Less than 6 oz (170 g) of lean meat, poultry, or fish each day. A 3-oz (85-g) serving of meat is about the same size as a deck of cards. One egg equals 1 oz (28 g). 2-3 servings of low-fat dairy each day. One serving is 1 cup (237 mL). 1 serving of nuts, seeds, or beans 5 times each week. 2-3 servings of heart-healthy fats. Healthy fats called omega-3 fatty acids are found in foods such as walnuts, flaxseeds, fortified milks, and eggs. These fats are also found in cold-water fish, such as sardines, salmon,  and mackerel. Limit how much you eat of: Canned or prepackaged foods. Food that is high in trans fat, such as some fried foods. Food that is high in saturated fat, such as fatty meat. Desserts and other sweets, sugary drinks, and other foods with added sugar. Full-fat dairy products. Do not salt foods before eating. Do not eat more than 4 egg yolks a week. Try to eat at least 2 vegetarian meals a week. Eat more home-cooked food and less restaurant, buffet, and fast food. Lifestyle When eating at a restaurant, ask that your food be prepared with less salt or no salt, if possible. If you drink alcohol: Limit how much you use to: 0-1 drink a day for women who are not pregnant. 0-2 drinks a day for men. Be aware of how much alcohol is in your drink. In the [place] one drink equals one 12 oz bottle of beer (355 mL), one 5 oz glass of wine (148 mL), or one 1 oz glass of hard liquor (44 mL). General information Avoid eating more than 2,300 mg of salt a day. If you have hypertension, you may need to reduce your sodium intake to 1,500 mg a day. Work with your health care provider to maintain a healthy body weight or to lose weight. Ask what an ideal weight is for you. Get at least 30 minutes of exercise that causes your heart to beat faster (aerobic exercise) most days of the week. Activities may include walking, swimming, or biking. Work with your health care provider or dietitian to  adjust your eating plan to your individual calorie needs. What foods should I eat? Fruits All fresh, dried, or frozen fruit. Canned fruit in natural juice (without added sugar). Vegetables Fresh or frozen vegetables (raw, steamed, roasted, or grilled). Low-sodium or reduced-sodium tomato and vegetable juice. Low-sodium or reduced-sodium tomato sauce and tomato paste. Low-sodium or reduced-sodium canned vegetables. Grains Whole-grain or whole-wheat bread. Whole-grain or whole-wheat pasta. Brown rice. Oatmeal. Quinoa.  Bulgur. Whole-grain and low-sodium cereals. Pita bread. Low-fat, low-sodium crackers. Whole-wheat flour tortillas. Meats and other proteins Skinless chicken or turkey. Ground chicken or turkey. Pork with fat trimmed off. Fish and seafood. Egg whites. Dried beans, peas, or lentils. Unsalted nuts, nut butters, and seeds. Unsalted canned beans. Lean cuts of beef with fat trimmed off. Low-sodium, lean precooked or cured meat, such as sausages or meat loaves. Dairy Low-fat (1%) or fat-free (skim) milk. Reduced-fat, low-fat, or fat-free cheeses. Nonfat, low-sodium ricotta or cottage cheese. Low-fat or nonfat yogurt. Low-fat, low-sodium cheese. Fats and oils Soft margarine without trans fats. Vegetable oil. Reduced-fat, low-fat, or light mayonnaise and salad dressings (reduced-sodium). Canola, safflower, olive, avocado, soybean, and sunflower oils. Avocado. Seasonings and condiments Herbs. Spices. Seasoning mixes without salt. Other foods Unsalted popcorn and pretzels. Fat-free sweets. The items listed above may not be a complete list of foods and beverages you can eat. Contact a dietitian for more information. What foods should I avoid? Fruits Canned fruit in a light or heavy syrup. Fried fruit. Fruit in cream or butter sauce. Vegetables Creamed or fried vegetables. Vegetables in a cheese sauce. Regular canned vegetables (not low-sodium or reduced-sodium). Regular canned tomato sauce and paste (not low-sodium or reduced-sodium). Regular tomato and vegetable juice (not low-sodium or reduced-sodium). Pickles. Olives. Grains Baked goods made with fat, such as croissants, muffins, or some breads. Dry pasta or rice meal packs. Meats and other proteins Fatty cuts of meat. Ribs. Fried meat. Bacon. Bologna, salami, and other precooked or cured meats, such as sausages or meat loaves. Fat from the back of a pig (fatback). Bratwurst. Salted nuts and seeds. Canned beans with added salt. Canned or smoked fish.  Whole eggs or egg yolks. Chicken or turkey with skin. Dairy Whole or 2% milk, cream, and half-and-half. Whole or full-fat cream cheese. Whole-fat or sweetened yogurt. Full-fat cheese. Nondairy creamers. Whipped toppings. Processed cheese and cheese spreads. Fats and oils Butter. Stick margarine. Lard. Shortening. Ghee. Bacon fat. Tropical oils, such as coconut, palm kernel, or palm oil. Seasonings and condiments Onion salt, garlic salt, seasoned salt, table salt, and sea salt. Worcestershire sauce. Tartar sauce. Barbecue sauce. Teriyaki sauce. Soy sauce, including reduced-sodium. Steak sauce. Canned and packaged gravies. Fish sauce. Oyster sauce. Cocktail sauce. Store-bought horseradish. Ketchup. Mustard. Meat flavorings and tenderizers. Bouillon cubes. Hot sauces. Pre-made or packaged marinades. Pre-made or packaged taco seasonings. Relishes. Regular salad dressings. Other foods Salted popcorn and pretzels. The items listed above may not be a complete list of foods and beverages you should avoid. Contact a dietitian for more information. Where to find more information National Heart, Lung, and Blood Institute: www.nhlbi.nih.gov American Heart Association: www.heart.org Academy of Nutrition and Dietetics: www.eatright.org National Kidney Foundation: www.kidney.org Summary The DASH eating plan is a healthy eating plan that has been shown to reduce high blood pressure (hypertension). It may also reduce your risk for type 2 diabetes, heart disease, and stroke. When on the DASH eating plan, aim to eat more fresh fruits and vegetables, whole grains, lean proteins, low-fat dairy, and heart-healthy fats. With the DASH   eating plan, you should limit salt (sodium) intake to 2,300 mg a day. If you have hypertension, you may need to reduce your sodium intake to 1,500 mg a day. Work with your health care provider or dietitian to adjust your eating plan to your individual calorie needs. This information is not  intended to replace advice given to you by your health care provider. Make sure you discuss any questions you have with your health care provider. Document Revised: 05/25/2019 Document Reviewed: 05/25/2019 Elsevier Patient Education  2023 Elsevier Inc. Carbohydrate Counting for Diabetes Mellitus, Adult Carbohydrate counting is a method of keeping track of how many carbohydrates you eat. Eating carbohydrates increases the amount of sugar (glucose) in the blood. Counting how many carbohydrates you eat improves how well you manage your blood glucose. This, in turn, helps you manage your diabetes. Carbohydrates are measured in grams (g) per serving. It is important to know how many carbohydrates (in grams or by serving size) you can have in each meal. This is different for every person. A dietitian can help you make a meal plan and calculate how many carbohydrates you should have at each meal and snack. What foods contain carbohydrates? Carbohydrates are found in the following foods: Grains, such as breads and cereals. Dried beans and soy products. Starchy vegetables, such as potatoes, peas, and corn. Fruit and fruit juices. Milk and yogurt. Sweets and snack foods, such as cake, cookies, candy, chips, and soft drinks. How do I count carbohydrates in foods? There are two ways to count carbohydrates in food. You can read food labels or learn standard serving sizes of foods. You can use either of these methods or a combination of both. Using the Nutrition Facts label The Nutrition Facts list is included on the labels of almost all packaged foods and beverages in the [place]. It includes: The serving size. Information about nutrients in each serving, including the grams of carbohydrate per serving. To use the Nutrition Facts, decide how many servings you will have. Then, multiply the number of servings by the number of carbohydrates per serving. The resulting number is the total grams of  carbohydrates that you will be having. Learning the standard serving sizes of foods When you eat carbohydrate foods that are not packaged or do not include Nutrition Facts on the label, you need to measure the servings in order to count the grams of carbohydrates. Measure the foods that you will eat with a food scale or measuring cup, if needed. Decide how many standard-size servings you will eat. Multiply the number of servings by 15. For foods that contain carbohydrates, one serving equals 15 g of carbohydrates. For example, if you eat 2 cups or 10 oz (300 g) of strawberries, you will have eaten 2 servings and 30 g of carbohydrates (2 servings x 15 g = 30 g). For foods that have more than one food mixed, such as soups and casseroles, you must count the carbohydrates in each food that is included. The following list contains standard serving sizes of common carbohydrate-rich foods. Each of these servings has about 15 g of carbohydrates: 1 slice of bread. 1 six-inch (15 cm) tortilla. ? cup or 2 oz (53 g) cooked rice or pasta.  cup or 3 oz (85 g) cooked or canned, drained and rinsed beans or lentils.  cup or 3 oz (85 g) starchy vegetable, such as peas, corn, or squash.  cup or 4 oz (120 g) hot cereal.  cup or 3 oz (85   g) boiled or mashed potatoes, or  or 3 oz (85 g) of a large baked potato.  cup or 4 fl oz (118 mL) fruit juice. 1 cup or 8 fl oz (237 mL) milk. 1 small or 4 oz (106 g) apple.  or 2 oz (63 g) of a medium banana. 1 cup or 5 oz (150 g) strawberries. 3 cups or 1 oz (28.3 g) popped popcorn. What is an example of carbohydrate counting? To calculate the grams of carbohydrates in this sample meal, follow the steps shown below. Sample meal 3 oz (85 g) chicken breast. ? cup or 4 oz (106 g) brown rice.  cup or 3 oz (85 g) corn. 1 cup or 8 fl oz (237 mL) milk. 1 cup or 5 oz (150 g) strawberries with sugar-free whipped topping. Carbohydrate calculation Identify the foods that  contain carbohydrates: Rice. Corn. Milk. Strawberries. Calculate how many servings you have of each food: 2 servings rice. 1 serving corn. 1 serving milk. 1 serving strawberries. Multiply each number of servings by 15 g: 2 servings rice x 15 g = 30 g. 1 serving corn x 15 g = 15 g. 1 serving milk x 15 g = 15 g. 1 serving strawberries x 15 g = 15 g. Add together all of the amounts to find the total grams of carbohydrates eaten: 30 g + 15 g + 15 g + 15 g = 75 g of carbohydrates total. What are tips for following this plan? Shopping Develop a meal plan and then make a shopping list. Buy fresh and frozen vegetables, fresh and frozen fruit, dairy, eggs, beans, lentils, and whole grains. Look at food labels. Choose foods that have more fiber and less sugar. Avoid processed foods and foods with added sugars. Meal planning Aim to have the same number of grams of carbohydrates at each meal and for each snack time. Plan to have regular, balanced meals and snacks. Where to find more information American Diabetes Association: diabetes.org Centers for Disease Control and Prevention: cdc.gov Academy of Nutrition and Dietetics: eatright.org Association of Diabetes Care & Education Specialists: diabeteseducator.org Summary Carbohydrate counting is a method of keeping track of how many carbohydrates you eat. Eating carbohydrates increases the amount of sugar (glucose) in your blood. Counting how many carbohydrates you eat improves how well you manage your blood glucose. This helps you manage your diabetes. A dietitian can help you make a meal plan and calculate how many carbohydrates you should have at each meal and snack. This information is not intended to replace advice given to you by your health care provider. Make sure you discuss any questions you have with your health care provider. Document Revised: 01/23/2020 Document Reviewed: 01/23/2020 Elsevier Patient Education  2023 Elsevier  Inc.  

## 2022-08-04 NOTE — Progress Notes (Signed)
Established Patient Office Visit  Subjective   Patient ID: Lindsey Benjamin, female    DOB: 1965/11/09  Age: 57 y.o. MRN: [id number]  Chief Complaint  Patient presents with   Follow-up   Hypertension   Diabetes    HPI Lindsey Benjamin is a 57 yo female who has history of hypothyroidism,hypertension, Asthma, hyperlipidemia,sleep apnea on Cpap, presents for routine follow up visit for her blood pressure and diabetes. She states that she's compliant with her medications, denies side effects and continues to make healthy lifestyle changes. Her HgbA1C on 02/23/22 was 7.3% and is 7.5% 08/04/22 with a blood glucose of 92 mg/dL on 08/04/22. She reports that her energy is low and she's constantly tired. She reports checking her blood glucose at supper time and it's usually less than 180 mg/dL after eating. She denies hypo/hyperglycemic symptoms, peripheral neuropathy is under control with taking gabapentin and does not perform daily foot checks. She states her diet is high in pasta and rice, and gets what she can afford. She also states that she stays hydrated but has dry mouth. Pt. reports urinary frequency. Denies dysuria, CVA tenderness, and pelvic pain.  Her blood pressure was elevated during visit, but she states that she took her medications 30 minutes prior to coming to her appointment. She reports she has not been checking her blood pressure at home because she doesn't have the energy. She reports she has severe sleep apnea and she wakes up every 2 hours, but will follow up at [facility] on 11/11/22 for sleep study. She reports intermittent shortness of breath with exertion and reports not needing to use her inhaler in a couple of years. Denies chest pain, and swelling to lower extremities.  Reports she just went to the eye doctor and the doctor stated she had cataracts. She states that her mood is good, denies suicidal nor homicidal ideation, and reports no further complaints at this time.    Review  of Systems  Constitutional:  Positive for malaise/fatigue.  HENT: Negative.    Eyes: Negative.   Respiratory:  Positive for shortness of breath (intermittent with exertion).   Cardiovascular: Negative.   Gastrointestinal: Negative.   Genitourinary:  Positive for frequency.  Musculoskeletal: Negative.   Skin: Negative.   Neurological: Negative.   Endo/Heme/Allergies: Negative.   Psychiatric/Behavioral: Negative.        Objective:   Diabetic foot exam performed and patient reports normal feeling in L and R feet, no wounds, dry skin present.   BP (!) 157/92 (BP Location: Right Arm, Patient Position: Sitting)   Pulse (!) 56   Temp (!) 97.4 F (36.3 C) (Oral)   Resp 18   Ht 4\' 9"  (1.448 m)   Wt 210 lb 11.2 oz (95.6 kg)   LMP 07/15/2016 (Approximate)   SpO2 92%   BMI 45.60 kg/m  BP Readings from Last 3 Encounters:  08/04/22 (!) 157/92  05/26/22 (!) 165/100  02/23/22 (!) 155/97   Wt Readings from Last 3 Encounters:  08/04/22 210 lb 11.2 oz (95.6 kg)  05/26/22 199 lb 1.2 oz (90.3 kg)  02/23/22 199 lb 1.6 oz (90.3 kg)    Physical Exam Vitals reviewed.  Constitutional:      Appearance: Normal appearance.  HENT:     Head: Normocephalic.     Right Ear: External ear normal.     Left Ear: External ear normal.     Nose: Nose normal.     Mouth/Throat:     Mouth: Mucous membranes  are dry.  Eyes:     Conjunctiva/sclera: Conjunctivae normal.     Pupils: Pupils are equal, round, and reactive to light.  Cardiovascular:     Rate and Rhythm: Normal rate and regular rhythm.     Pulses: Normal pulses.     Heart sounds: Normal heart sounds.  Pulmonary:     Effort: Pulmonary effort is normal.     Breath sounds: Normal breath sounds.  Abdominal:     General: Bowel sounds are decreased.     Comments: Reports last bowel movement was yesterday 08/03/22  Genitourinary:    Comments: Deferred pt request Musculoskeletal:     Cervical back: Normal range of motion.     Right lower leg:  1+ Edema present.     Left lower leg: 1+ Edema present.  Skin:    General: Skin is warm and dry.  Neurological:     Mental Status: She is alert and oriented to person, place, and time.  Psychiatric:        Mood and Affect: Mood normal.        Behavior: Behavior normal.        Thought Content: Thought content normal.        Judgment: Judgment normal.      Results for orders placed or performed in visit on 08/04/22  POCT Glucose (CBG)  Result Value Ref Range   POC Glucose 92 70 - 99 mg/dl  POCT HgB A1C  Result Value Ref Range   Hemoglobin A1C 7.5 (A) 4.0 - 5.6 %   HbA1c POC (<> result, manual entry)     HbA1c, POC (prediabetic range)     HbA1c, POC (controlled diabetic range)      Last CBC Lab Results  Component Value Date   WBC 6.7 11/04/2021   HGB 15.6 11/04/2021   HCT 46.4 11/04/2021   MCV 89 11/04/2021   MCH 29.8 11/04/2021   RDW 14.3 11/04/2021   PLT 236 06/77/0340   Last metabolic panel Lab Results  Component Value Date   GLUCOSE 137 (H) 11/04/2021   NA 137 11/04/2021   K 4.3 11/04/2021   CL 96 11/04/2021   CO2 24 11/04/2021   BUN 13 11/04/2021   CREATININE 0.95 11/04/2021   EGFR 69 11/04/2021   CALCIUM 9.4 11/04/2021   PROT 7.4 11/04/2021   ALBUMIN 4.4 11/04/2021   LABGLOB 3.0 11/04/2021   AGRATIO 1.5 11/04/2021   BILITOT 0.4 11/04/2021   ALKPHOS 77 11/04/2021   AST 47 (H) 11/04/2021   ALT 52 (H) 11/04/2021   Last lipids Lab Results  Component Value Date   CHOL 195 06/10/2021   HDL 39 (L) 06/10/2021   LDLCALC 108 (H) 06/10/2021   TRIG 277 (H) 06/10/2021   CHOLHDL 5.0 (H) 06/10/2021   Last hemoglobin A1c Lab Results  Component Value Date   HGBA1C 7.5 (A) 08/04/2022   Last thyroid functions Lab Results  Component Value Date   TSH 2.520 11/04/2021   T4TOTAL 12.6 (H) 11/04/2021     The 10-year ASCVD risk score (Arnett DK, et al., 2019) is: 26.8%    Assessment & Plan:  1. Type 2 diabetes mellitus without complication, without  long-term current use of insulin (HCC) Her HgbA1c was 7.5%, her diabetes is not controlled at this time. Her Glipizide will be increased to 5 mg a day. Encourage to eat low carb diet with fruits and vegetables. Encourage to check her blood glucose at home tid, record and bring log to follow  up appointment. She was advised that her fasting reading should be between 80-130 mg/dl. Will check Vitamin D to rule in fatigue. Educated pt on hypoglycemic and symptoms.  - POCT Glucose (CBG) - POCT HgB A1C - Vitamin D (25 hydroxy); Future - Urine Microalbumin w/creat. ratio; Future - glipiZIDE (GLUCOTROL) 5 MG tablet; Take 1 tablet (5 mg total) by mouth daily before breakfast.  Dispense: 90 tablet; Refill: 0 - Urine Microalbumin w/creat. ratio - Vitamin D (25 hydroxy)  3. Leg swelling She will continue to take blood pressure medications and diuretics as prescribed. Encourage to check blood pressure at least once a day.  Encourage patient to continue staying hydrated. She was advised to wear compression stockings and elevate her legs while sitting down.  4. Essential hypertension Her blood pressure is not under control, her goal should be less than 130/80. She was encourage to check blood pressure at home at least once a day, record and bring log to follow up appointment. She was educated on the signs and symptoms of CVA and advised to go to the ED. She will continue taking blood pressure medications and diuretics as prescribed. Follow DASH diet and exercise as tolerated. - atenolol (TENORMIN) 50 MG tablet; Take 1 tablet (50 mg total) by mouth daily.  Dispense: 90 tablet; Refill: 3   Follow up in clinic in 3 months on or around (11/03/2022), or PRN if symptoms worsen.    Lorre Munroe, RN

## 2022-08-05 ENCOUNTER — Other Ambulatory Visit: Payer: Self-pay

## 2022-08-05 ENCOUNTER — Other Ambulatory Visit: Payer: Self-pay | Admitting: Gerontology

## 2022-08-05 DIAGNOSIS — E559 Vitamin D deficiency, unspecified: Secondary | ICD-10-CM

## 2022-08-05 MED ORDER — VITAMIN D (ERGOCALCIFEROL) 1.25 MG (50000 UNIT) PO CAPS
50000.0000 [IU] | ORAL_CAPSULE | ORAL | 0 refills | Status: DC
  Filled 2022-08-05: qty 12, 84d supply, fill #0

## 2022-08-06 LAB — VITAMIN D 25 HYDROXY (VIT D DEFICIENCY, FRACTURES): Vit D, 25-Hydroxy: 14.7 ng/mL — ABNORMAL LOW (ref 30.0–100.0)

## 2022-08-06 LAB — TSH: TSH: 58.1 u[IU]/mL — ABNORMAL HIGH (ref 0.450–4.500)

## 2022-08-07 LAB — MICROALBUMIN / CREATININE URINE RATIO
Creatinine, Urine: 154.7 mg/dL
Microalb/Creat Ratio: 195 mg/g creat — ABNORMAL HIGH (ref 0–29)
Microalbumin, Urine: 302 ug/mL

## 2022-08-10 ENCOUNTER — Other Ambulatory Visit: Payer: Self-pay | Admitting: Gerontology

## 2022-08-17 ENCOUNTER — Other Ambulatory Visit: Payer: Self-pay

## 2022-08-24 ENCOUNTER — Other Ambulatory Visit: Payer: Self-pay

## 2022-10-27 ENCOUNTER — Ambulatory Visit: Payer: Self-pay | Admitting: Gerontology

## 2022-10-28 ENCOUNTER — Ambulatory Visit: Payer: Self-pay | Admitting: Gerontology

## 2022-10-31 ENCOUNTER — Other Ambulatory Visit: Payer: Self-pay | Admitting: Internal Medicine

## 2022-10-31 ENCOUNTER — Other Ambulatory Visit: Payer: Self-pay | Admitting: Gerontology

## 2022-10-31 ENCOUNTER — Other Ambulatory Visit: Payer: Self-pay

## 2022-10-31 DIAGNOSIS — E119 Type 2 diabetes mellitus without complications: Secondary | ICD-10-CM

## 2022-10-31 MED FILL — Losartan Potassium Tab 100 MG: ORAL | 90 days supply | Qty: 90 | Fill #2 | Status: AC

## 2022-10-31 MED FILL — Losartan Potassium Tab 100 MG: ORAL | 90 days supply | Qty: 90 | Fill #2 | Status: CN

## 2022-11-01 ENCOUNTER — Other Ambulatory Visit: Payer: Self-pay

## 2022-11-01 MED ORDER — PRAVASTATIN SODIUM 40 MG PO TABS
40.0000 mg | ORAL_TABLET | Freq: Every day | ORAL | 5 refills | Status: DC
  Filled 2022-11-01: qty 40, 40d supply, fill #0

## 2022-11-02 ENCOUNTER — Other Ambulatory Visit: Payer: Self-pay

## 2022-11-02 MED FILL — Albuterol Sulfate Inhal Aero 108 MCG/ACT (90MCG Base Equiv): RESPIRATORY_TRACT | 25 days supply | Qty: 6.7 | Fill #0 | Status: AC

## 2022-11-02 MED FILL — Pravastatin Sodium Tab 40 MG: ORAL | 30 days supply | Qty: 30 | Fill #0 | Status: AC

## 2022-11-02 MED FILL — Glipizide Tab 5 MG: ORAL | 30 days supply | Qty: 30 | Fill #0 | Status: AC

## 2022-11-05 ENCOUNTER — Other Ambulatory Visit: Payer: Self-pay

## 2022-11-08 ENCOUNTER — Other Ambulatory Visit: Payer: Self-pay

## 2022-11-10 ENCOUNTER — Other Ambulatory Visit: Payer: Self-pay

## 2022-11-24 ENCOUNTER — Ambulatory Visit: Payer: Self-pay | Admitting: Gerontology

## 2022-12-01 ENCOUNTER — Ambulatory Visit: Payer: Self-pay | Admitting: Gerontology

## 2022-12-01 ENCOUNTER — Other Ambulatory Visit: Payer: Self-pay

## 2022-12-02 ENCOUNTER — Other Ambulatory Visit: Payer: Self-pay

## 2022-12-10 ENCOUNTER — Other Ambulatory Visit: Payer: Self-pay

## 2022-12-16 ENCOUNTER — Ambulatory Visit: Payer: Self-pay | Admitting: Gerontology

## 2022-12-29 ENCOUNTER — Ambulatory Visit: Payer: Self-pay | Admitting: Gerontology

## 2023-01-18 ENCOUNTER — Ambulatory Visit: Payer: Self-pay | Admitting: Gerontology

## 2023-02-01 ENCOUNTER — Ambulatory Visit: Payer: Self-pay | Admitting: Gerontology

## 2023-02-02 ENCOUNTER — Ambulatory Visit: Payer: Self-pay | Admitting: Gerontology

## 2023-02-21 ENCOUNTER — Other Ambulatory Visit: Payer: Self-pay

## 2023-02-23 ENCOUNTER — Ambulatory Visit: Payer: Self-pay | Admitting: Gerontology

## 2023-03-03 ENCOUNTER — Encounter: Payer: Self-pay | Admitting: Emergency Medicine

## 2023-03-03 ENCOUNTER — Emergency Department: Payer: Medicaid Other

## 2023-03-03 ENCOUNTER — Inpatient Hospital Stay
Admission: EM | Admit: 2023-03-03 | Discharge: 2023-03-08 | DRG: 208 | Disposition: A | Discharge status: Home / Self Care | Payer: Medicaid Other | Attending: Internal Medicine | Admitting: Internal Medicine

## 2023-03-03 ENCOUNTER — Other Ambulatory Visit: Payer: Self-pay

## 2023-03-03 DIAGNOSIS — Z91199 Patient's noncompliance with other medical treatment and regimen due to unspecified reason: Secondary | ICD-10-CM

## 2023-03-03 DIAGNOSIS — Z9981 Dependence on supplemental oxygen: Secondary | ICD-10-CM

## 2023-03-03 DIAGNOSIS — F17210 Nicotine dependence, cigarettes, uncomplicated: Secondary | ICD-10-CM | POA: Diagnosis present

## 2023-03-03 DIAGNOSIS — T415X6A Underdosing of therapeutic gases, initial encounter: Secondary | ICD-10-CM | POA: Diagnosis present

## 2023-03-03 DIAGNOSIS — R0602 Shortness of breath: Secondary | ICD-10-CM | POA: Diagnosis present

## 2023-03-03 DIAGNOSIS — I959 Hypotension, unspecified: Secondary | ICD-10-CM | POA: Diagnosis present

## 2023-03-03 DIAGNOSIS — Z91138 Patient's unintentional underdosing of medication regimen for other reason: Secondary | ICD-10-CM

## 2023-03-03 DIAGNOSIS — E039 Hypothyroidism, unspecified: Secondary | ICD-10-CM | POA: Diagnosis present

## 2023-03-03 DIAGNOSIS — Z881 Allergy status to other antibiotic agents status: Secondary | ICD-10-CM

## 2023-03-03 DIAGNOSIS — Z79899 Other long term (current) drug therapy: Secondary | ICD-10-CM | POA: Diagnosis not present

## 2023-03-03 DIAGNOSIS — Z7984 Long term (current) use of oral hypoglycemic drugs: Secondary | ICD-10-CM

## 2023-03-03 DIAGNOSIS — J9602 Acute respiratory failure with hypercapnia: Principal | ICD-10-CM

## 2023-03-03 DIAGNOSIS — I3139 Other pericardial effusion (noninflammatory): Secondary | ICD-10-CM | POA: Diagnosis present

## 2023-03-03 DIAGNOSIS — Z1152 Encounter for screening for COVID-19: Secondary | ICD-10-CM

## 2023-03-03 DIAGNOSIS — I110 Hypertensive heart disease with heart failure: Secondary | ICD-10-CM | POA: Diagnosis present

## 2023-03-03 DIAGNOSIS — J441 Chronic obstructive pulmonary disease with (acute) exacerbation: Secondary | ICD-10-CM | POA: Diagnosis present

## 2023-03-03 DIAGNOSIS — J9622 Acute and chronic respiratory failure with hypercapnia: Secondary | ICD-10-CM | POA: Diagnosis present

## 2023-03-03 DIAGNOSIS — G9341 Metabolic encephalopathy: Secondary | ICD-10-CM | POA: Diagnosis present

## 2023-03-03 DIAGNOSIS — E876 Hypokalemia: Secondary | ICD-10-CM | POA: Diagnosis not present

## 2023-03-03 DIAGNOSIS — Z6841 Body Mass Index (BMI) 40.0 and over, adult: Secondary | ICD-10-CM | POA: Diagnosis not present

## 2023-03-03 DIAGNOSIS — E038 Other specified hypothyroidism: Secondary | ICD-10-CM | POA: Diagnosis not present

## 2023-03-03 DIAGNOSIS — I5033 Acute on chronic diastolic (congestive) heart failure: Secondary | ICD-10-CM | POA: Diagnosis present

## 2023-03-03 DIAGNOSIS — J9621 Acute and chronic respiratory failure with hypoxia: Secondary | ICD-10-CM | POA: Diagnosis present

## 2023-03-03 DIAGNOSIS — E1165 Type 2 diabetes mellitus with hyperglycemia: Secondary | ICD-10-CM | POA: Diagnosis not present

## 2023-03-03 DIAGNOSIS — G4733 Obstructive sleep apnea (adult) (pediatric): Secondary | ICD-10-CM | POA: Diagnosis present

## 2023-03-03 DIAGNOSIS — Z888 Allergy status to other drugs, medicaments and biological substances status: Secondary | ICD-10-CM

## 2023-03-03 DIAGNOSIS — Z8249 Family history of ischemic heart disease and other diseases of the circulatory system: Secondary | ICD-10-CM

## 2023-03-03 DIAGNOSIS — T380X5A Adverse effect of glucocorticoids and synthetic analogues, initial encounter: Secondary | ICD-10-CM | POA: Diagnosis not present

## 2023-03-03 DIAGNOSIS — I5032 Chronic diastolic (congestive) heart failure: Secondary | ICD-10-CM

## 2023-03-03 DIAGNOSIS — Z9049 Acquired absence of other specified parts of digestive tract: Secondary | ICD-10-CM

## 2023-03-03 DIAGNOSIS — R001 Bradycardia, unspecified: Secondary | ICD-10-CM | POA: Diagnosis not present

## 2023-03-03 DIAGNOSIS — Z8349 Family history of other endocrine, nutritional and metabolic diseases: Secondary | ICD-10-CM

## 2023-03-03 DIAGNOSIS — Z83438 Family history of other disorder of lipoprotein metabolism and other lipidemia: Secondary | ICD-10-CM

## 2023-03-03 DIAGNOSIS — Z79890 Hormone replacement therapy (postmenopausal): Secondary | ICD-10-CM

## 2023-03-03 DIAGNOSIS — I10 Essential (primary) hypertension: Secondary | ICD-10-CM | POA: Diagnosis not present

## 2023-03-03 DIAGNOSIS — N179 Acute kidney failure, unspecified: Secondary | ICD-10-CM | POA: Diagnosis not present

## 2023-03-03 DIAGNOSIS — Z880 Allergy status to penicillin: Secondary | ICD-10-CM

## 2023-03-03 HISTORY — DX: Nicotine dependence, unspecified, uncomplicated: F17.200

## 2023-03-03 HISTORY — DX: Body Mass Index (BMI) 40.0 and over, adult: Z684

## 2023-03-03 HISTORY — DX: Chronic respiratory failure with hypoxia: J96.11

## 2023-03-03 HISTORY — DX: Patient's other noncompliance with medication regimen for other reason: Z91.148

## 2023-03-03 HISTORY — DX: Dependence on supplemental oxygen: Z99.81

## 2023-03-03 HISTORY — DX: Obstructive sleep apnea (adult) (pediatric): G47.33

## 2023-03-03 HISTORY — DX: Chronic obstructive pulmonary disease, unspecified: J44.9

## 2023-03-03 HISTORY — DX: Unspecified diastolic (congestive) heart failure: I50.30

## 2023-03-03 HISTORY — DX: Morbid (severe) obesity due to excess calories: E66.01

## 2023-03-03 LAB — URINALYSIS, ROUTINE W REFLEX MICROSCOPIC
Bilirubin Urine: NEGATIVE
Glucose, UA: NEGATIVE mg/dL
Hgb urine dipstick: NEGATIVE
Ketones, ur: NEGATIVE mg/dL
Leukocytes,Ua: NEGATIVE
Nitrite: POSITIVE — AB
Protein, ur: >=300 mg/dL — AB
Specific Gravity, Urine: 1.01 (ref 1.005–1.030)
pH: 8 (ref 5.0–8.0)

## 2023-03-03 LAB — BLOOD GAS, ARTERIAL
Acid-Base Excess: 11.4 mmol/L — ABNORMAL HIGH (ref 0.0–2.0)
Acid-Base Excess: 20.6 mmol/L — ABNORMAL HIGH (ref 0.0–2.0)
Acid-Base Excess: 23.3 mmol/L — ABNORMAL HIGH (ref 0.0–2.0)
Acid-Base Excess: 19.8 mmol/L — ABNORMAL HIGH (ref 0.0–2.0)
Bicarbonate: 46 mmol/L — ABNORMAL HIGH (ref 20.0–28.0)
Bicarbonate: 46.6 mmol/L — ABNORMAL HIGH (ref 20.0–28.0)
Bicarbonate: 47.3 mmol/L — ABNORMAL HIGH (ref 20.0–28.0)
Bicarbonate: 47.3 mmol/L — ABNORMAL HIGH (ref 20.0–28.0)
Delivery systems: POSITIVE
Expiratory PAP: 6 cmH2O
FIO2: 60 %
FIO2: 50 %
FIO2: 50 %
FIO2: 50 %
Inspiratory PAP: 14 cmH2O
MECHVT: 450 mL
MECHVT: 350 mL
MECHVT: 330 mL
Mechanical Rate: 25
Mechanical Rate: 20
Mechanical Rate: 12
O2 Saturation: 99.9 %
O2 Saturation: 98.7 %
O2 Saturation: 94.1 %
O2 Saturation: 93.5 %
PEEP: 8 cmH2O
PEEP: 8 cmH2O
PEEP: 8 cmH2O
Patient temperature: 37
Patient temperature: 37
Patient temperature: 37
Patient temperature: 37
pCO2 arterial: >123 mmHg (ref 32–48)
pCO2 arterial: 52 mmHg — ABNORMAL HIGH (ref 32–48)
pCO2 arterial: 45 mmHg (ref 32–48)
pCO2 arterial: 65 mmHg — ABNORMAL HIGH (ref 32–48)
pH, Arterial: 7.14 — CL (ref 7.35–7.45)
pH, Arterial: 7.56 — ABNORMAL HIGH (ref 7.35–7.45)
pH, Arterial: 7.63 (ref 7.35–7.45)
pH, Arterial: 7.47 — ABNORMAL HIGH (ref 7.35–7.45)
pO2, Arterial: 177 mmHg — ABNORMAL HIGH (ref 83–108)
pO2, Arterial: 86 mmHg (ref 83–108)
pO2, Arterial: 60 mmHg — ABNORMAL LOW (ref 83–108)
pO2, Arterial: 63 mmHg — ABNORMAL LOW (ref 83–108)

## 2023-03-03 LAB — CBC WITH DIFFERENTIAL/PLATELET
Abs Immature Granulocytes: 0.02 10*3/uL (ref 0.00–0.07)
Basophils Absolute: 0.1 10*3/uL (ref 0.0–0.1)
Basophils Relative: 1 %
Eosinophils Absolute: 0.1 10*3/uL (ref 0.0–0.5)
Eosinophils Relative: 1 %
HCT: 56.3 % — ABNORMAL HIGH (ref 36.0–46.0)
Hemoglobin: 16.4 g/dL — ABNORMAL HIGH (ref 12.0–15.0)
Immature Granulocytes: 0 %
Lymphocytes Relative: 23 %
Lymphs Abs: 1.4 10*3/uL (ref 0.7–4.0)
MCH: 26.2 pg (ref 26.0–34.0)
MCHC: 29.1 g/dL — ABNORMAL LOW (ref 30.0–36.0)
MCV: 89.9 fL (ref 80.0–100.0)
Monocytes Absolute: 0.5 10*3/uL (ref 0.1–1.0)
Monocytes Relative: 8 %
Neutro Abs: 4.2 10*3/uL (ref 1.7–7.7)
Neutrophils Relative %: 67 %
Platelets: 174 10*3/uL (ref 150–400)
RBC: 6.26 MIL/uL — ABNORMAL HIGH (ref 3.87–5.11)
RDW: 16 % — ABNORMAL HIGH (ref 11.5–15.5)
WBC: 6.3 10*3/uL (ref 4.0–10.5)
nRBC: 0 % (ref 0.0–0.2)

## 2023-03-03 LAB — BASIC METABOLIC PANEL
Anion gap: 11 (ref 5–15)
BUN: 10 mg/dL (ref 6–20)
CO2: 39 mmol/L — ABNORMAL HIGH (ref 22–32)
Calcium: 8.4 mg/dL — ABNORMAL LOW (ref 8.9–10.3)
Chloride: 86 mmol/L — ABNORMAL LOW (ref 98–111)
Creatinine, Ser: 0.81 mg/dL (ref 0.44–1.00)
GFR, Estimated: >60 mL/min (ref >60)
Glucose, Bld: 100 mg/dL — ABNORMAL HIGH (ref 70–99)
Potassium: 4.7 mmol/L (ref 3.5–5.1)
Sodium: 136 mmol/L (ref 135–145)

## 2023-03-03 LAB — BLOOD GAS, VENOUS
Acid-Base Excess: 19.5 mmol/L — ABNORMAL HIGH (ref 0.0–2.0)
Bicarbonate: 51 mmol/L — ABNORMAL HIGH (ref 20.0–28.0)
O2 Saturation: 87.5 %
Patient temperature: 37
pCO2, Ven: 99 mmHg (ref 44–60)
pH, Ven: 7.32 (ref 7.25–7.43)
pO2, Ven: 63 mmHg — ABNORMAL HIGH (ref 32–45)

## 2023-03-03 LAB — RESP PANEL BY RT-PCR (RSV, FLU A&B, COVID)  RVPGX2
Influenza A by PCR: NEGATIVE
Influenza B by PCR: NEGATIVE
Resp Syncytial Virus by PCR: NEGATIVE
SARS Coronavirus 2 by RT PCR: NEGATIVE

## 2023-03-03 LAB — MAGNESIUM: Magnesium: 2 mg/dL (ref 1.7–2.4)

## 2023-03-03 LAB — GLUCOSE, CAPILLARY
Glucose-Capillary: 128 mg/dL — ABNORMAL HIGH (ref 70–99)
Glucose-Capillary: 187 mg/dL — ABNORMAL HIGH (ref 70–99)

## 2023-03-03 LAB — PROCALCITONIN: Procalcitonin: <0.1 ng/mL

## 2023-03-03 LAB — BRAIN NATRIURETIC PEPTIDE: B Natriuretic Peptide: 1751.3 pg/mL — ABNORMAL HIGH (ref 0.0–100.0)

## 2023-03-03 LAB — MRSA NEXT GEN BY PCR, NASAL: MRSA by PCR Next Gen: NOT DETECTED

## 2023-03-03 MED ORDER — LEVOTHYROXINE SODIUM 50 MCG PO TABS
175.0000 ug | ORAL_TABLET | Freq: Every day | ORAL | Status: DC
  Administered 2023-03-04: 175 ug via ORAL
  Filled 2023-03-03: qty 2

## 2023-03-03 MED ORDER — METHYLPREDNISOLONE SODIUM SUCC 40 MG IJ SOLR
40.0000 mg | Freq: Every day | INTRAMUSCULAR | Status: DC
  Administered 2023-03-03 – 2023-03-08 (×6): 40 mg via INTRAVENOUS
  Filled 2023-03-03 (×6): qty 1

## 2023-03-03 MED ORDER — CHLORHEXIDINE GLUCONATE CLOTH 2 % EX PADS
6.0000 | MEDICATED_PAD | Freq: Every day | CUTANEOUS | Status: DC
  Administered 2023-03-03 – 2023-03-04 (×2): 6 via TOPICAL

## 2023-03-03 MED ORDER — ENOXAPARIN SODIUM 60 MG/0.6ML IJ SOSY
50.0000 mg | PREFILLED_SYRINGE | INTRAMUSCULAR | Status: DC
  Administered 2023-03-03 – 2023-03-07 (×5): 50 mg via SUBCUTANEOUS
  Filled 2023-03-03 (×5): qty 0.6

## 2023-03-03 MED ORDER — MIDAZOLAM HCL 2 MG/2ML IJ SOLN: 1.0000 mg | INTRAMUSCULAR | Status: DC | PRN

## 2023-03-03 MED ORDER — PROPOFOL 1000 MG/100ML IV EMUL
5.0000 ug/kg/min | INTRAVENOUS | Status: DC
  Administered 2023-03-03: 60 ug/kg/min via INTRAVENOUS
  Administered 2023-03-03: 5 ug/kg/min via INTRAVENOUS
  Administered 2023-03-03: 40 ug/kg/min via INTRAVENOUS
  Administered 2023-03-04: 20 ug/kg/min via INTRAVENOUS
  Administered 2023-03-04: 25 ug/kg/min via INTRAVENOUS
  Administered 2023-03-04 – 2023-03-05 (×2): 20 ug/kg/min via INTRAVENOUS
  Filled 2023-03-03 (×7): qty 100

## 2023-03-03 MED ORDER — FUROSEMIDE 10 MG/ML IJ SOLN
40.0000 mg | Freq: Once | INTRAMUSCULAR | Status: AC
  Administered 2023-03-03: 40 mg via INTRAVENOUS
  Filled 2023-03-03: qty 4

## 2023-03-03 MED ORDER — DEXAMETHASONE SODIUM PHOSPHATE 10 MG/ML IJ SOLN
10.0000 mg | Freq: Once | INTRAMUSCULAR | Status: AC
  Administered 2023-03-03: 10 mg via INTRAVENOUS
  Filled 2023-03-03: qty 1

## 2023-03-03 MED ORDER — IPRATROPIUM BROMIDE 0.02 % IN SOLN
0.5000 mg | Freq: Once | RESPIRATORY_TRACT | Status: AC
  Administered 2023-03-03: 0.5 mg via RESPIRATORY_TRACT
  Filled 2023-03-03: qty 2.5

## 2023-03-03 MED ORDER — ETOMIDATE 2 MG/ML IV SOLN
INTRAVENOUS | Status: AC
  Filled 2023-03-03: qty 10

## 2023-03-03 MED ORDER — POLYETHYLENE GLYCOL 3350 17 G PO PACK
17.0000 g | PACK | Freq: Every day | ORAL | Status: DC
  Administered 2023-03-04: 17 g
  Filled 2023-03-03: qty 1

## 2023-03-03 MED ORDER — DOCUSATE SODIUM 100 MG PO CAPS: 100.0000 mg | ORAL_CAPSULE | Freq: Two times a day (BID) | ORAL | Status: DC | PRN

## 2023-03-03 MED ORDER — NOREPINEPHRINE 4 MG/250ML-% IV SOLN
2.0000 ug/min | INTRAVENOUS | Status: DC
  Administered 2023-03-03: 2 ug/min via INTRAVENOUS
  Filled 2023-03-03: qty 250

## 2023-03-03 MED ORDER — IPRATROPIUM-ALBUTEROL 0.5-2.5 (3) MG/3ML IN SOLN
3.0000 mL | Freq: Four times a day (QID) | RESPIRATORY_TRACT | Status: DC
  Administered 2023-03-04 – 2023-03-05 (×7): 3 mL via RESPIRATORY_TRACT
  Filled 2023-03-03 (×7): qty 3

## 2023-03-03 MED ORDER — BUDESONIDE 0.5 MG/2ML IN SUSP
0.5000 mg | Freq: Two times a day (BID) | RESPIRATORY_TRACT | Status: DC
  Administered 2023-03-04 – 2023-03-08 (×9): 0.5 mg via RESPIRATORY_TRACT
  Filled 2023-03-03 (×9): qty 2

## 2023-03-03 MED ORDER — ETOMIDATE 2 MG/ML IV SOLN
INTRAVENOUS | Status: AC | PRN
  Administered 2023-03-03: 20 mg via INTRAVENOUS

## 2023-03-03 MED ORDER — SUCCINYLCHOLINE CHLORIDE 20 MG/ML IJ SOLN
INTRAMUSCULAR | Status: AC | PRN
  Administered 2023-03-03: 100 mg via INTRAVENOUS

## 2023-03-03 MED ORDER — FENTANYL BOLUS VIA INFUSION
50.0000 ug | INTRAVENOUS | Status: DC | PRN
  Administered 2023-03-04: 100 ug via INTRAVENOUS
  Administered 2023-03-04 (×2): 50 ug via INTRAVENOUS
  Administered 2023-03-04: 100 ug via INTRAVENOUS

## 2023-03-03 MED ORDER — INSULIN ASPART 100 UNIT/ML IJ SOLN
0.0000 [IU] | INTRAMUSCULAR | Status: DC
  Administered 2023-03-03: 2 [IU] via SUBCUTANEOUS
  Administered 2023-03-03: 3 [IU] via SUBCUTANEOUS
  Administered 2023-03-04 (×4): 2 [IU] via SUBCUTANEOUS
  Administered 2023-03-04 (×2): 3 [IU] via SUBCUTANEOUS
  Administered 2023-03-05 (×2): 2 [IU] via SUBCUTANEOUS
  Administered 2023-03-06: 3 [IU] via SUBCUTANEOUS
  Administered 2023-03-06: 2 [IU] via SUBCUTANEOUS
  Filled 2023-03-03 (×12): qty 1

## 2023-03-03 MED ORDER — SUCCINYLCHOLINE CHLORIDE 200 MG/10ML IV SOSY
PREFILLED_SYRINGE | INTRAVENOUS | Status: AC
  Filled 2023-03-03: qty 10

## 2023-03-03 MED ORDER — POLYETHYLENE GLYCOL 3350 17 G PO PACK: 17.0000 g | PACK | Freq: Every day | ORAL | Status: DC | PRN

## 2023-03-03 MED ORDER — PANTOPRAZOLE SODIUM 40 MG IV SOLR
40.0000 mg | Freq: Every day | INTRAVENOUS | Status: DC
  Administered 2023-03-03 – 2023-03-07 (×5): 40 mg via INTRAVENOUS
  Filled 2023-03-03 (×5): qty 10

## 2023-03-03 MED ORDER — DOCUSATE SODIUM 50 MG/5ML PO LIQD
100.0000 mg | Freq: Two times a day (BID) | ORAL | Status: DC
  Administered 2023-03-03 – 2023-03-05 (×4): 100 mg
  Filled 2023-03-03 (×4): qty 10

## 2023-03-03 MED ORDER — ALBUTEROL SULFATE (2.5 MG/3ML) 0.083% IN NEBU
10.0000 mg | INHALATION_SOLUTION | Freq: Once | RESPIRATORY_TRACT | Status: AC
  Administered 2023-03-03: 10 mg via RESPIRATORY_TRACT
  Filled 2023-03-03: qty 12

## 2023-03-03 MED ORDER — FENTANYL 2500MCG IN NS 250ML (10MCG/ML) PREMIX INFUSION
0.0000 ug/h | INTRAVENOUS | Status: DC
  Administered 2023-03-03 – 2023-03-04 (×2): 25 ug/h via INTRAVENOUS
  Filled 2023-03-03 (×2): qty 250

## 2023-03-03 MED ORDER — SODIUM CHLORIDE 0.9 % IV SOLN
250.0000 mL | INTRAVENOUS | Status: DC
  Administered 2023-03-03: 250 mL via INTRAVENOUS

## 2023-03-03 NOTE — H&P (Signed)
NAME:  Lindsey Benjamin, MRN:  [id number], DOB:  [birth year], LOS: 0 ADMISSION DATE:  03/03/2023, CONSULTATION DATE:  03/03/2023 REFERRING MD:  Dr. Anner Crete, CHIEF COMPLAINT:  Shortness of Breath   Brief Pt Description / Synopsis:  57 y.o. female admitted with Acute on Chronic Hypoxic & Hypercapnic Respiratory Failure in setting of Acute Decompensated HFpEF and suspected AECOPD, along with Acute Metabolic Encephalopathy due to CO2 Narcosis, requiring intubation and mechanical ventilation.  History of Present Illness:  Lindsey Benjamin is a 57 year old female with a past medical history significant for chronic hypoxic respiratory failure on 6 L supplemental oxygen at baseline, COPD, severe OSA requiring BiPAP nightly, hypertension, diabetes mellitus type 2 who presented to [facility] ED on 03/03/2023 due to complaints of shortness of breath.  Patient is currently intubated and sedated and unable to contribute to history with no family at bedside, therefore history is obtained from chart review.  It is reported that she was found by her home health nurse today very lethargic and hypoxic with O2 saturations of 70% on room air, not wearing her normal supplemental 6 L.  The nurse placed her back on 6 L with improvement of sats to the mid 80s, which she was placed on nonrebreather with EMS with improvement to 99%.  She denied chest pain, cough, sputum production, fever, chills, abdominal pain, nausea, vomiting, diarrhea, dysuria.  ED Course: Initial Vital Signs: Temperature 97.6 F orally, pulse 66, respiratory rate 18, blood pressure 139/108, SpO2 96% on nonrebreather Significant Labs: Bicarbonate 39, glucose 100, BNP 1751, WBC 6.3, hemoglobin 16.4 VBG: pH 7.32/pCO2 99/pO2 63/bicarb 51 COVID-19/RSV/flu PCR negative Imaging Chest X-ray>>IMPRESSION: Enlarged cardiopericardial silhouette with some vascular congestion. Medications Administered: Bronchodilators and IV Decadron  Given her hypercapnia and respiratory  status she was placed on BiPAP in the ED.  Repeat ABG after 1 hour showed worsening hypercapnia with CO2 greater than 123 and worsening acidosis of 7.14.  Decision was made by ED provider to intubate.  PCCM was asked to admit for further workup and treatment.  Please see "Significant Hospital Events" section below for full detailed hospital course.  Pertinent  Medical History   Past Medical History:  Diagnosis Date   Asthma    Hypertension    Hypothyroidism    Type 2 diabetes mellitus without complication, without long-term current use of insulin (HCC) 03/04/2021    Micro Data:  8/29: SARS-CoV-2/RSV/Flu PCR>>negative 8/29: Respiratory viral panel>> 8/29: Tracheal aspirate>>  Antimicrobials:   Anti-infectives (From admission, onward)    None       Significant Hospital Events: Including procedures, antibiotic start and stop dates in addition to other pertinent events   8/29: Presented to ED, failed trial of BiPAP requiring intubation in ED.  PCCM asked to admit  Interim History / Subjective:  -Patient seen and examined in the ED, currently intubated and sedated withdrawing from pain -Hemodynamically stable, not requiring vasopressors -Initially tidal volumes on the vent of 450, based on her height tidal volume was decreased down to 400, and ABG post showing alkalosis ~tidal volume further decreased to 350 cc and rate decreased to 21, will repeat ABG in approximately 1 hour  Objective   Blood pressure (!) 168/87, pulse (!) 58, temperature 97.6 F (36.4 C), temperature source Oral, resp. rate (!) 25, height 4\' 9"  (1.448 m), weight 106.1 kg, last menstrual period 07/15/2016, SpO2 99%.    Vent Mode: AC FiO2 (%):  [50 %-60 %] 50 % Set Rate:  [25 bmp] 25 bmp Vt Set:  [  450 mL] 450 mL PEEP:  [8 cmH20] 8 cmH20  No intake or output data in the 24 hours ending 03/03/23 1543 Filed Weights   03/03/23 1211  Weight: 106.1 kg    Examination: General: Acute on chronically ill  appearing obese female, laying in bed, intubated and sedated, in NAD HENT: Atraumatic, normocephalic, neck supple, difficult to assess JVD due to body habitus Lungs: Coarse rhonchi throughout, no wheezing, vent assisted, event Cardiovascular: Regular rate and rhythm, s1s2, no M/R/G Abdomen: Obese, soft, nontender, nondistended, no guarding or rebound tenderness, BS+ x4 Extremities: No deformities, 1+ edema BLE, no cyanosis, chronic tropic changes to BLE Neuro: Sedated, withdraws from pain, currently not following commands, pupils PERRL 1 mm bilaterally GU: Foley catheter in place   Resolved Hospital Problem list     Assessment & Plan:   #Acute on Chronic Hypoxic & Hypercapnic Respiratory Failure due to Pulmonary Edema & suspected AECOPD PMHx: COPD,Asthma, on 6L supplemental O2 at baseline, OSA on BiPAP -Full vent support, implement lung protective strategies -Plateau pressures less than 30 cm H20 -Wean FiO2 & PEEP as tolerated to maintain O2 sats 88 to 92% -Follow intermittent Chest X-ray & ABG as needed -Spontaneous Breathing Trials when respiratory parameters met and mental status permits -Implement VAP Bundle -Bronchodilators & Pulmicort nebs -IV steroids -Diuresis as BP and renal function permits ~ given 40 mg IV Lasix x1 dose 8/29 -Consider Azithromycin/Rocephin for AECOPD ~ will check procalcitonin  #Acute Decompensated HFpEF #Hypertension -Continuous cardiac monitoring -Maintain MAP >65 -Cautious IV fluids -Vasopressors as needed to maintain MAP goal ~ not requiring currently  -Trend HS Troponin until peaked -Echocardiogram pending -Diuresis as BP and renal function permits ~ given 40 mg IV Lasix x1 dose 8/29 -Hold home Atenolol, Spironolactone for now given risk for hypotension with sedation and diuresis  #Diabetes Mellitus Type II #Hypothyroidism -CBG's q4h; Target range of 140 to 180 -SSI -Follow ICU Hypo/Hyperglycemia protocol -Check TSH -Continue home  synthroid  #Acute Metabolic Encephalopathy in setting of CO2 Narcosis #Sedation needs in setting of mechanical ventilation -Maintain a RASS goal of 0 to -1 -Fentanyl and Propofol as needed to maintain RASS goal -Avoid sedating medications as able -Daily wake up assessment    Best Practice (right click and "Reselect all SmartList Selections" daily)   Diet/type: NPO DVT prophylaxis: LMWH GI prophylaxis: PPI Lines: N/A Foley:  Yes, and it is still needed Code Status:  full code Last date of multidisciplinary goals of care discussion [N/A]  Labs   CBC: Recent Labs  Lab 03/03/23 1227  WBC 6.3  NEUTROABS 4.2  HGB 16.4*  HCT 56.3*  MCV 89.9  PLT 174    Basic Metabolic Panel: Recent Labs  Lab 03/03/23 1227  NA 136  K 4.7  CL 86*  CO2 39*  GLUCOSE 100*  BUN 10  CREATININE 0.81  CALCIUM 8.4*  MG 2.0   GFR: Estimated Creatinine Clearance: 77.4 mL/min (by C-G formula based on SCr of 0.81 mg/dL). Recent Labs  Lab 03/03/23 1227  WBC 6.3    Liver Function Tests: No results for input(s): "AST", "ALT", "ALKPHOS", "BILITOT", "PROT", "ALBUMIN" in the last 168 hours. No results for input(s): "LIPASE", "AMYLASE" in the last 168 hours. No results for input(s): "AMMONIA" in the last 168 hours.  ABG    Component Value Date/Time   PHART 7.14 (LL) 03/03/2023 1359   PCO2ART >123 (HH) 03/03/2023 1359   PO2ART 177 (H) 03/03/2023 1359   HCO3 46.0 (H) 03/03/2023 1359   O2SAT  99.9 03/03/2023 1359     Coagulation Profile: No results for input(s): "INR", "PROTIME" in the last 168 hours.  Cardiac Enzymes: No results for input(s): "CKTOTAL", "CKMB", "CKMBINDEX", "TROPONINI" in the last 168 hours.  HbA1C: Hemoglobin A1C  Date/Time Value Ref Range Status  08/04/2022 10:25 AM 7.5 (A) 4.0 - 5.6 % Final  02/23/2022 10:45 AM 7.3 (A) 4.0 - 5.6 % Final  07/11/2015 12:00 AM 6.3  Final   Hgb A1c MFr Bld  Date/Time Value Ref Range Status  11/04/2021 09:56 AM 7.6 (H) 4.8 - 5.6 %  Final    Comment:             Prediabetes: 5.7 - 6.4          Diabetes: >6.4          Glycemic control for adults with diabetes: <7.0   06/10/2021 11:14 AM 6.9 (H) 4.8 - 5.6 % Final    Comment:             Prediabetes: 5.7 - 6.4          Diabetes: >6.4          Glycemic control for adults with diabetes: <7.0     CBG: No results for input(s): "GLUCAP" in the last 168 hours.  Review of Systems:   Unable to assess due to intubation/sedation/critical illness   Past Medical History:  She,  has a past medical history of Asthma, Hypertension, Hypothyroidism, and Type 2 diabetes mellitus without complication, without long-term current use of insulin (HCC) (03/04/2021).   Surgical History:   Past Surgical History:  Procedure Laterality Date   CARDIAC CATHETERIZATION  11/2020   CESAREAN SECTION     CHOLECYSTECTOMY     FOOT SURGERY     INNER EAR SURGERY     tubes in ears and repair a hole in the eardrum     Social History:   reports that she has been smoking cigarettes. She has a 10 pack-year smoking history. She has never used smokeless tobacco. She reports that she does not currently use alcohol. She reports that she does not use drugs.   Family History:  Her family history includes Cushing syndrome in her mother; Heart attack (age of onset: 26) in her father; Hyperlipidemia in her brother; Hypertension in her brother; Thyroid disease in her brother and mother. There is no history of Breast cancer.   Allergies Allergies  Allergen Reactions   Ofloxacin Swelling   Penicillins Anaphylaxis   Ranitidine Hives   Lisinopril Cough   Ceclor [Cefaclor] Hives   Zantac [Ranitidine Hcl]     hives   Levaquin [Levofloxacin In D5w] Rash    Peel her skin   Levofloxacin Other (See Comments) and Rash   Tomato Rash     Home Medications  Prior to Admission medications   Medication Sig Start Date End Date Taking? Authorizing Provider  acetaminophen (TYLENOL) 500 MG tablet Take 500 mg by  mouth every 6 (six) hours as needed.    [provider]  albuterol (VENTOLIN HFA) 108 (90 Base) MCG/ACT inhaler INHALE 2 PUFFS BY MOUTH FOUR TIMES A DAY. REPLACES VENTOLIN. 11/02/22 11/02/23  Iloabachie, Chioma E, NP  atenolol (TENORMIN) 50 MG tablet Take 1 tablet (50 mg total) by mouth daily. 08/04/22 03/21/23  Iloabachie, Chioma E, NP  blood glucose meter kit and supplies KIT USE AS DIRECTED TO CHECK BLOOD SUGAR UP TO 4 TIMES DAILY. 11/11/21   Iloabachie, Chioma E, NP  Blood Pressure KIT USE  AS DIRECTED DAILY. 11/11/21   Iloabachie, Chioma E, NP  DULoxetine (CYMBALTA) 60 MG capsule Take one capsule by mouth daily every evening 01/11/22     Fluticasone Propionate (FLONASE NA) OTC 10/07/19   [provider]  furosemide (LASIX) 20 MG tablet Take 1 tablet (20 mg total) by mouth once daily as needed (for weight gain more than 3 pounds in a day or 5 pounds in a week). 03/24/21     gabapentin (NEURONTIN) 600 MG tablet Take (1/2) tablet (300 mg total) by mouth once daily at bedtime. 08/11/21     gabapentin (NEURONTIN) 600 MG tablet 1/2 q HS 12/29/21     glipiZIDE (GLUCOTROL) 5 MG tablet Take 1 tablet (5 mg total) by mouth daily before breakfast. 11/02/22   Iloabachie, Chioma E, NP  glucose blood (RIGHTEST GS550 BLOOD GLUCOSE) test strip USE AS DIRECTED TO CHECK BLOOD SUGAR UP TO 4 TIMES DAILY. 02/09/22   Iloabachie, Chioma E, NP  hydrOXYzine (ATARAX) 10 MG tablet Take 1 to 2 tablets (10-20mg  total) by mouth once daily as needed for anxiety. 08/11/21     hydrOXYzine (ATARAX) 10 MG tablet 1 - 2 q day prn anxiety 12/29/21     lansoprazole (PREVACID) 30 MG capsule TAKE ONE CAPSULE BY MOUTH EVERY DAY AT [facility] 04/29/22 04/29/23  Iloabachie, Chioma E, NP  levothyroxine (SYNTHROID) 175 MCG tablet Take 1 tablet (175 mcg total) by mouth daily before breakfast. 08/04/22   Iloabachie, Chioma E, NP  losartan (COZAAR) 100 MG tablet Take 1 tablet (100 mg total) by mouth daily. 12/22/21 02/06/23  Iloabachie, Chioma E, NP   pravastatin (PRAVACHOL) 40 MG tablet Take 1 tablet (40 mg total) by mouth once daily. 11/02/22   Iloabachie, Chioma E, NP  pravastatin (PRAVACHOL) 40 MG tablet Take 1 tablet (40 mg total) by mouth daily. 11/01/22     Rightest GL300 Lancets MISC USE AS DIRECTED TO CHECK BLOOD SUGAR UP TO 4 TIMES DAILY. 02/09/22   Iloabachie, Chioma E, NP  spironolactone (ALDACTONE) 25 MG tablet Take 1 tablet (25 mg total) by mouth once daily. 03/24/21     Vitamin D, Ergocalciferol, (DRISDOL) 1.25 MG (50000 UNIT) CAPS capsule Take 1 capsule (50,000 Units total) by mouth every 7 (seven) days. 08/05/22   Iloabachie, Francoise Ceo, NP     Critical care time: 60 minutes     Harlon Ditty, AGACNP-BC [facility] Prefer epic messenger for cross cover needs If after hours, please call E-link

## 2023-03-03 NOTE — ED Notes (Addendum)
Pt reports that she is on 7L [place] at home.  Changed her to 7L [place] based on this and pt is in mid 90's while awake, she desats to 70% even on  7L [place].  EDP notified Critical VBG  pH7.32 Pco2 99 Bicab 51 Dr. Anner Crete notified and he ordered Bipap

## 2023-03-03 NOTE — Congregational Nurse Program (Signed)
  Dept: [phone]   Congregational Nurse Program Note  Date of Encounter: 08/29/2024This RN had received a call the previous evening from client to assist her with changing out her portable oxygen tank. Provided Education with her on the oxygen. Client states she had an nephrology appointment at [facility]. Went to check on client this morning. Upon arriving at her home, her husband said she had been sleeping a lot. Somewhat difficult in waking her and noticed her skin was pale with very dark areas around her eyes and gray lips and around her mouth. She did not have her oxygen on. Checked pulse oximetry and flashed 72 to 73%. {Placed her oxygen nasal cannula on client noticed the setting at 5.5 Liters.   O2 sensor increased to 87-89 %. Client states "it is suppose to be at 7L according to the sleep study I had done". RN checked her blood pressure and 220/120. Advised client need to call EMS for her and she agreed. She also stated she had not been taking her medicine for some time know because she slept a lot because of the oxygen. RN asked to called her nephrologist office also to update them on her status. Client agreed. Spoke with RN at [facility] nephrologist office after client left with EMS to take client to [facility] ED. Prior to leaving spoke with client husband in whom is in a wheelchair and advised to call family or friend to assist him. Client states she is the primary care giver for both of them.   Past Medical History: Past Medical History:  Diagnosis Date   Asthma    Hypertension    Hypothyroidism    Type 2 diabetes mellitus without complication, without long-term current use of insulin (HCC) 03/04/2021    Encounter Details:  CNP Questionnaire - 03/03/23 1212       Questionnaire   Ask client: Do you give verbal consent for me to treat you today? Yes    Visit Setting with Client Home    Patient Status Unknown    Insurance Unknown    Insurance/Financial Assistance Referral N/A    Medication Have  Medication Insecurities    Medical Provider Yes    Screening Referrals Made N/A    Medical Referrals Made ED    Medical Appointment Made N/A    Food N/A    Transportation N/A    Housing/Utilities N/A    Interventions Advocate/Support    Abnormal to Normal Screening Since Last CN Visit N/A    Screenings CN Performed N/A    ED Visit Averted N/A   Called EMs For client   Life-Saving Intervention Made N/A

## 2023-03-03 NOTE — ED Notes (Signed)
RT called for ABG.

## 2023-03-03 NOTE — ED Notes (Signed)
Informed RN bed assigned 

## 2023-03-03 NOTE — Progress Notes (Signed)
PHARMACIST - PHYSICIAN COMMUNICATION  CONCERNING:  Enoxaparin (Lovenox) for DVT Prophylaxis    RECOMMENDATION: Patient was prescribed enoxaprin 40mg  q24 hours for VTE prophylaxis.   Filed Weights   03/03/23 1211  Weight: 106.1 kg (234 lb)    Body mass index is 50.64 kg/m.  Estimated Creatinine Clearance: 77.4 mL/min (by C-G formula based on SCr of 0.81 mg/dL).   Based on [facility] policy patient is candidate for enoxaparin 0.5mg /kg TBW SQ every 24 hours based on BMI being >30.   DESCRIPTION: Pharmacy has adjusted enoxaparin dose per [facility] policy.  Patient is now receiving enoxaparin 50 mg every 24 hours    Barrie Folk, PharmD Clinical Pharmacist  03/03/2023 3:44 PM

## 2023-03-03 NOTE — Progress Notes (Signed)
Attempted to reach pt's spouse Addaleigh Landers.  Only number listed was unfortunately disconnected, unable to update, and no other contacts currently listed.    Harlon Ditty, AGACNP-BC [place] [facility] Prefer epic messenger for cross cover needs If after hours, please call E-link

## 2023-03-03 NOTE — Consult Note (Signed)
PHARMACY CONSULT NOTE - ELECTROLYTES  Pharmacy Consult for Electrolyte Monitoring and Replacement   Recent Labs: Height: 4\' 9"  (144.8 cm) Weight: 106.1 kg (234 lb) IBW/kg (Calculated) : 38.6 Estimated Creatinine Clearance: 77.4 mL/min (by C-G formula based on SCr of 0.81 mg/dL). Potassium (mmol/L)  Date Value  03/03/2023 4.7   Magnesium (mg/dL)  Date Value  30/86/5784 2.0   Calcium (mg/dL)  Date Value  69/62/9528 8.4 (L)   Albumin (g/dL)  Date Value  41/32/4401 4.4   Sodium (mmol/L)  Date Value  03/03/2023 136  11/04/2021 137    Assessment  Lindsey Benjamin is a 57 y.o. female presenting with shortness of breath. PMH significant for HTN, chronic respiratory failure on 6 L nasal cannula baseline and BiPAP overnight with severe OSA, DM2 . Pharmacy has been consulted to monitor and replace electrolytes.  Diet: NPO Pertinent medications: furosemide 40mg  IV x 1  Goal of Therapy: Electrolytes WNL  Plan:  No replacement currently indicated Check BMP, Mg, Phos with AM labs  Thank you for allowing pharmacy to be a part of this patient's care.  Bettey Costa, PharmD Clinical Pharmacist 03/03/2023 4:23 PM

## 2023-03-03 NOTE — Progress Notes (Signed)
 An USGPIV (ultrasound guided PIV) has been placed for short-term vasopressor infusion. A correctly placed ivWatch must be used when administering Vasopressors. Should this treatment be needed beyond 24 hours, central line access should be obtained.  It will be the responsibility of the bedside nurse to follow best practice to prevent extravasations.

## 2023-03-03 NOTE — ED Triage Notes (Signed)
Pt to ED via [facility] from home for shortness of breath and being sleepy. Pt states that she is supposed to be wearing oxygen but that she not worn it in about a month. Pts community health nurse was at her house and called EMS due to pt being very sleepy. Pts initial SpO2 was 72% on room air. EMS placed pt on 4-6 liters via [place] with sats only improving to 89%, pt was then placed on NRB with sats improving to 99%. Pts End tidal has been between 60-79 with EMS.

## 2023-03-03 NOTE — ED Notes (Signed)
X-ray at bedside

## 2023-03-03 NOTE — ED Notes (Signed)
RT was called for ABG.

## 2023-03-03 NOTE — ED Provider Notes (Signed)
[facility] Provider Note    Event Date/Time   First MD Initiated Contact with Patient 03/03/23 1220     (approximate)   History   Shortness of Breath   HPI Lindsey Benjamin is a 57 y.o. female with HTN, chronic respiratory failure on 6 L nasal cannula baseline and BiPAP overnight with severe OSA, DM2 presenting today for shortness of breath.  Patient states she has been short of breath worsening over the past couple weeks.  She was found by her home health nurse to be very sleepy and oxygen saturation at 70% on room air.  They placed her on 6 L with improvement only into the mid 80s.  Then got placed on nonrebreather with improvement to 99%.  End-tidal CO2 was reading very high.  Patient states she supposed to be wearing oxygen during the day and thought she was but maybe it got turned off.  She wears BiPAP overnight.  Otherwise denying cough, congestion, chest pain, abdominal pain, nausea, vomiting.     Physical Exam   Triage Vital Signs: ED Triage Vitals  Encounter Vitals Group     BP 03/03/23 1210 (!) 139/108     Systolic BP Percentile --      Diastolic BP Percentile --      Pulse Rate 03/03/23 1230 66     Resp 03/03/23 1210 18     Temp 03/03/23 1210 97.6 F (36.4 C)     Temp Source 03/03/23 1210 Oral     SpO2 03/03/23 1215 96 %     Weight 03/03/23 1211 234 lb (106.1 kg)     Height 03/03/23 1211 4\' 9"  (1.448 m)     Head Circumference --      Peak Flow --      Pain Score 03/03/23 1211 0     Pain Loc --      Pain Education --      Exclude from Growth Chart --     Most recent vital signs: Vitals:   03/03/23 1542 03/03/23 1543  BP: (!) 169/83   Pulse: (!) 57 (!) 57  Resp: (!) 25 (!) 25  Temp:    SpO2: 99% 100%   Physical Exam: I have reviewed the vital signs and nursing notes. General: Awake, alert, tachypneic and obese. Head:  Atraumatic, normocephalic.   ENT:  EOM intact, PERRL. Oral mucosa is pink and moist with no lesions. Neck:  Neck is supple with full range of motion, No meningeal signs. Cardiovascular:  RRR, No murmurs. Peripheral pulses palpable and equal bilaterally. Respiratory:  Symmetrical chest wall expansion.  No rhonchi, rales, or wheezes. Diminished air movement throughout all lung fields.  Mild tachypnea.  No use of accessory muscles.   Musculoskeletal:  No cyanosis or edema. Moving extremities with full ROM Abdomen:  Soft, nontender, nondistended. Neuro:  GCS 15, moving all four extremities, interacting appropriately. Speech clear. Psych:  Calm, appropriate.   Skin:  Warm, dry, no rash.     ED Results / Procedures / Treatments   Labs (all labs ordered are listed, but only abnormal results are displayed) Labs Reviewed  CBC WITH DIFFERENTIAL/PLATELET - Abnormal; Notable for the following components:      Result Value   RBC 6.26 (*)    Hemoglobin 16.4 (*)    HCT 56.3 (*)    MCHC 29.1 (*)    RDW 16.0 (*)    All other components within normal limits  BASIC METABOLIC PANEL - Abnormal; Notable  for the following components:   Chloride 86 (*)    CO2 39 (*)    Glucose, Bld 100 (*)    Calcium 8.4 (*)    All other components within normal limits  BLOOD GAS, VENOUS - Abnormal; Notable for the following components:   pCO2, Ven 99 (*)    pO2, Ven 63 (*)    Bicarbonate 51.0 (*)    Acid-Base Excess 19.5 (*)    All other components within normal limits  BRAIN NATRIURETIC PEPTIDE - Abnormal; Notable for the following components:   B Natriuretic Peptide 1,751.3 (*)    All other components within normal limits  BLOOD GAS, ARTERIAL - Abnormal; Notable for the following components:   pH, Arterial 7.14 (*)    pCO2 arterial >123 (*)    pO2, Arterial 177 (*)    Bicarbonate 46.0 (*)    Acid-Base Excess 11.4 (*)    All other components within normal limits  RESP PANEL BY RT-PCR (RSV, FLU A&B, COVID)  RVPGX2  CULTURE, RESPIRATORY W GRAM STAIN  MAGNESIUM  URINALYSIS, ROUTINE W REFLEX MICROSCOPIC  HIV  ANTIBODY (ROUTINE TESTING W REFLEX)     EKG My EKG interpretation: Rate of 66, sinus rhythm, normal axis.  Normal intervals.  No acute ST elevations or depressions.   RADIOLOGY See ED course for independent chest x-ray interpretation   PROCEDURES:  Critical Care performed: Yes, see critical care procedure note(s)  .Critical Care  Performed by: Janith Lima, MD Authorized by: Janith Lima, MD   Critical care provider statement:    Critical care time (minutes):  35   Critical care time was exclusive of:  Separately billable procedures and treating other patients   Critical care was necessary to treat or prevent imminent or life-threatening deterioration of the following conditions:  Respiratory failure   Critical care was time spent personally by me on the following activities:  Blood draw for specimens, development of treatment plan with patient or surrogate, ordering and performing treatments and interventions, ordering and review of laboratory studies, ordering and review of radiographic studies, pulse oximetry, re-evaluation of patient's condition, review of old charts, obtaining history from patient or surrogate, discussions with consultants, interpretation of cardiac output measurements, examination of patient and evaluation of patient's response to treatment   Care discussed with: admitting provider   Procedure Name: Intubation Date/Time: 03/03/2023 3:46 PM  Performed by: Janith Lima, MDPre-anesthesia Checklist: Patient identified, Patient being monitored, Emergency Drugs available, Timeout performed and Suction available Oxygen Delivery Method: Non-rebreather mask Preoxygenation: Pre-oxygenation with 100% oxygen Induction Type: Rapid sequence Ventilation: Mask ventilation without difficulty Laryngoscope Size: Glidescope and 4 Grade View: Grade I Tube size: 7.5 mm Number of attempts: 1 Airway Equipment and Method: Rigid stylet and Video-laryngoscopy Placement  Confirmation: ETT inserted through vocal cords under direct vision, CO2 detector and Breath sounds checked- equal and bilateral Secured at: 21 cm Tube secured with: ETT holder Dental Injury: Teeth and Oropharynx as per pre-operative assessment  Difficulty Due To: Difficulty was anticipated       MEDICATIONS ORDERED IN ED: Medications  etomidate (AMIDATE) 2 MG/ML injection (  Not Given 03/03/23 1544)  propofol (DIPRIVAN) 1000 MG/100ML infusion (60 mcg/kg/min  106.1 kg Intravenous Rate/Dose Change 03/03/23 1520)  fentaNYL in NS (68mcg/ml) infusion-PREMIX (250 mcg/hr Intravenous Rate/Dose Change 03/03/23 1534)  docusate sodium (COLACE) capsule 100 mg (has no administration in time range)  polyethylene glycol (MIRALAX / GLYCOLAX) packet 17 g (has no administration in time range)  enoxaparin (LOVENOX) injection 50 mg (has no administration in time range)  pantoprazole (PROTONIX) injection 40 mg (has no administration in time range)  albuterol (PROVENTIL) (2.5 MG/3ML) 0.083% nebulizer solution 10 mg (10 mg Nebulization Given 03/03/23 1259)  ipratropium (ATROVENT) nebulizer solution 0.5 mg (0.5 mg Nebulization Given 03/03/23 1259)  dexamethasone (DECADRON) injection 10 mg (10 mg Intravenous Given 03/03/23 1305)  etomidate (AMIDATE) injection (20 mg Intravenous Given 03/03/23 1433)  succinylcholine (ANECTINE) injection (100 mg Intravenous Given 03/03/23 1434)  furosemide (LASIX) injection 40 mg (40 mg Intravenous Given 03/03/23 1548)     IMPRESSION / MDM / ASSESSMENT AND PLAN / ED COURSE  I reviewed the triage vital signs and the nursing notes.                              Differential diagnosis includes, but is not limited to, COPD exacerbation, CHF exacerbation, pneumonia, PE.  Patient's presentation is most consistent with acute presentation with potential threat to life or bodily function.  Patient is a 57 year old female with COPD presenting today for acute hypercapnia  respiratory failure.  Initial CO2 of 99 and still talking at this time.  Placed on BiPAP.  BMP was elevated and CHF noted on chest x-ray.  Patient initially given albuterol, Atrovent, and Decadron.  Repeat ABG after 1 hour on the BiPAP showed worsening hypercapnia now to greater than 123 and worsening acidosis to 7.14.  Decision made to intubate at this time.  Patient successfully intubated and admitted to ICU for further care for likely CHF exacerbation with combination of COPD.  Patient vital signs stable after intubation.  See critical care and intubation documentation above.  The patient is on the cardiac monitor to evaluate for evidence of arrhythmia and/or significant heart rate changes. Clinical Course as of 03/03/23 1550  Thu Mar 03, 2023  1243 pCO2, Ven(!!): 99 [DW]  1308 B Natriuretic Peptide(!): 1,751.3 [DW]  1308 Patient started on bipap [DW]  1404 DG Chest Port 1 View Vascular congestion present but no focal pneumonia [DW]  1416 pH, Arterial(!!): 7.14 [DW]  1416 pCO2 arterial(!!): >123 [DW]  1416 Discussed worsening breating status with patient. Will plan for intubation at this time [DW]    Clinical Course User Index [DW] Janith Lima, MD     FINAL CLINICAL IMPRESSION(S) / ED DIAGNOSES   Final diagnoses:  Acute hypercapnic respiratory failure (HCC)  COPD exacerbation (HCC)     Rx / DC Orders   ED Discharge Orders     None        Note:  This document was prepared using Dragon voice recognition software and may include unintentional dictation errors.   Janith Lima, MD 03/03/23 [id number]

## 2023-03-04 ENCOUNTER — Inpatient Hospital Stay: Payer: Medicaid Other

## 2023-03-04 ENCOUNTER — Inpatient Hospital Stay (HOSPITAL_COMMUNITY)
Admit: 2023-03-04 | Discharge: 2023-03-04 | Disposition: A | Discharge status: Home / Self Care | Payer: Medicaid Other | Attending: Pulmonary Disease | Admitting: Pulmonary Disease

## 2023-03-04 DIAGNOSIS — J9622 Acute and chronic respiratory failure with hypercapnia: Secondary | ICD-10-CM | POA: Diagnosis not present

## 2023-03-04 DIAGNOSIS — J9621 Acute and chronic respiratory failure with hypoxia: Secondary | ICD-10-CM

## 2023-03-04 LAB — RESPIRATORY PANEL BY PCR

## 2023-03-04 LAB — HEPATIC FUNCTION PANEL
ALT: 19 U/L (ref 0–44)
AST: 34 U/L (ref 15–41)
Albumin: 2.3 g/dL — ABNORMAL LOW (ref 3.5–5.0)
Alkaline Phosphatase: 46 U/L (ref 38–126)
Bilirubin, Direct: 0.3 mg/dL — ABNORMAL HIGH (ref 0.0–0.2)
Indirect Bilirubin: 0.6 mg/dL (ref 0.3–0.9)
Total Bilirubin: 0.9 mg/dL (ref 0.3–1.2)
Total Protein: 5.5 g/dL — ABNORMAL LOW (ref 6.5–8.1)

## 2023-03-04 LAB — GLUCOSE, CAPILLARY
Glucose-Capillary: 105 mg/dL — ABNORMAL HIGH (ref 70–99)
Glucose-Capillary: 121 mg/dL — ABNORMAL HIGH (ref 70–99)
Glucose-Capillary: 129 mg/dL — ABNORMAL HIGH (ref 70–99)
Glucose-Capillary: 133 mg/dL — ABNORMAL HIGH (ref 70–99)
Glucose-Capillary: 145 mg/dL — ABNORMAL HIGH (ref 70–99)
Glucose-Capillary: 157 mg/dL — ABNORMAL HIGH (ref 70–99)
Glucose-Capillary: 167 mg/dL — ABNORMAL HIGH (ref 70–99)
Glucose-Capillary: 168 mg/dL — ABNORMAL HIGH (ref 70–99)

## 2023-03-04 LAB — MAGNESIUM: Magnesium: 1.7 mg/dL (ref 1.7–2.4)

## 2023-03-04 LAB — CBC
HCT: 53.2 % — ABNORMAL HIGH (ref 36.0–46.0)
Hemoglobin: 16.4 g/dL — ABNORMAL HIGH (ref 12.0–15.0)
MCH: 26.2 pg (ref 26.0–34.0)
MCHC: 30.8 g/dL (ref 30.0–36.0)
MCV: 85.1 fL (ref 80.0–100.0)
Platelets: 182 10*3/uL (ref 150–400)
RBC: 6.25 MIL/uL — ABNORMAL HIGH (ref 3.87–5.11)
RDW: 16.7 % — ABNORMAL HIGH (ref 11.5–15.5)
WBC: 8.9 10*3/uL (ref 4.0–10.5)
nRBC: 0 % (ref 0.0–0.2)

## 2023-03-04 LAB — ECHOCARDIOGRAM COMPLETE
AR max vel: 2.77 cm2
AV Area VTI: 2.81 cm2
AV Area mean vel: 2.69 cm2
AV Mean grad: 6 mmHg
AV Peak grad: 11.2 mmHg
Ao pk vel: 1.67 m/s
Area-P 1/2: 3.3 cm2
Height: 57.008 in
MV VTI: 2.64 cm2
S' Lateral: 2.1 cm
Weight: 3439.18 [oz_av]

## 2023-03-04 LAB — HEMOGLOBIN A1C
Hgb A1c MFr Bld: 7.3 % — ABNORMAL HIGH (ref 4.8–5.6)
Mean Plasma Glucose: 162.81 mg/dL

## 2023-03-04 LAB — BLOOD GAS, ARTERIAL
Acid-Base Excess: 19.6 mmol/L — ABNORMAL HIGH (ref 0.0–2.0)
Acid-Base Excess: 18.5 mmol/L — ABNORMAL HIGH (ref 0.0–2.0)
Bicarbonate: 45.7 mmol/L — ABNORMAL HIGH (ref 20.0–28.0)
Bicarbonate: 47.7 mmol/L — ABNORMAL HIGH (ref 20.0–28.0)
FIO2: 50 %
FIO2: 40 %
MECHVT: 350 mL
MECHVT: 330 mL
Mechanical Rate: 12
Mechanical Rate: 12
O2 Saturation: 96.9 %
O2 Saturation: 93 %
PEEP: 8 cmH2O
PEEP: 5 cmH2O
Patient temperature: 37
Patient temperature: 37
pCO2 arterial: 56 mmHg — ABNORMAL HIGH (ref 32–48)
pCO2 arterial: 77 mmHg (ref 32–48)
pH, Arterial: 7.52 — ABNORMAL HIGH (ref 7.35–7.45)
pH, Arterial: 7.4 (ref 7.35–7.45)
pO2, Arterial: 72 mmHg — ABNORMAL LOW (ref 83–108)
pO2, Arterial: 64 mmHg — ABNORMAL LOW (ref 83–108)

## 2023-03-04 LAB — RENAL FUNCTION PANEL
Albumin: 2.7 g/dL — ABNORMAL LOW (ref 3.5–5.0)
Anion gap: 15 (ref 5–15)
BUN: 11 mg/dL (ref 6–20)
CO2: 34 mmol/L — ABNORMAL HIGH (ref 22–32)
Calcium: 8.2 mg/dL — ABNORMAL LOW (ref 8.9–10.3)
Chloride: 86 mmol/L — ABNORMAL LOW (ref 98–111)
Creatinine, Ser: 1.04 mg/dL — ABNORMAL HIGH (ref 0.44–1.00)
GFR, Estimated: >60 mL/min (ref >60)
Glucose, Bld: 118 mg/dL — ABNORMAL HIGH (ref 70–99)
Phosphorus: 4.5 mg/dL (ref 2.5–4.6)
Potassium: 4.2 mmol/L (ref 3.5–5.1)
Sodium: 135 mmol/L (ref 135–145)

## 2023-03-04 LAB — PROCALCITONIN: Procalcitonin: <0.1 ng/mL

## 2023-03-04 LAB — HIV ANTIBODY (ROUTINE TESTING W REFLEX): HIV Screen 4th Generation wRfx: NONREACTIVE

## 2023-03-04 MED ORDER — FENTANYL CITRATE PF 50 MCG/ML IJ SOSY
PREFILLED_SYRINGE | INTRAMUSCULAR | Status: AC
  Filled 2023-03-04: qty 2

## 2023-03-04 MED ORDER — SUCCINYLCHOLINE CHLORIDE 200 MG/10ML IV SOSY
PREFILLED_SYRINGE | INTRAVENOUS | Status: AC
  Filled 2023-03-04: qty 10

## 2023-03-04 MED ORDER — FENTANYL CITRATE (PF) 100 MCG/2ML IJ SOLN
INTRAMUSCULAR | Status: AC
  Filled 2023-03-04: qty 2

## 2023-03-04 MED ORDER — LEVOTHYROXINE SODIUM 50 MCG PO TABS
175.0000 ug | ORAL_TABLET | Freq: Every day | ORAL | Status: DC
  Administered 2023-03-05 – 2023-03-06 (×2): 175 ug
  Filled 2023-03-04 (×2): qty 2

## 2023-03-04 MED ORDER — ETOMIDATE 2 MG/ML IV SOLN
INTRAVENOUS | Status: AC
  Filled 2023-03-04: qty 20

## 2023-03-04 MED ORDER — FUROSEMIDE 10 MG/ML IJ SOLN
40.0000 mg | Freq: Once | INTRAMUSCULAR | Status: AC
  Administered 2023-03-04: 40 mg via INTRAVENOUS
  Filled 2023-03-04: qty 4

## 2023-03-04 MED ORDER — PERFLUTREN LIPID MICROSPHERE
1.0000 mL | INTRAVENOUS | Status: AC | PRN
  Administered 2023-03-04: 5 mL via INTRAVENOUS

## 2023-03-04 MED ORDER — MAGNESIUM SULFATE 2 GM/50ML IV SOLN
2.0000 g | Freq: Once | INTRAVENOUS | Status: AC
  Administered 2023-03-04: 2 g via INTRAVENOUS
  Filled 2023-03-04: qty 50

## 2023-03-04 MED ORDER — DEXMEDETOMIDINE HCL IN NACL 400 MCG/100ML IV SOLN
0.0000 ug/kg/h | INTRAVENOUS | Status: DC
  Administered 2023-03-04: 0.4 ug/kg/h via INTRAVENOUS
  Filled 2023-03-04: qty 100

## 2023-03-04 MED ORDER — CHLORHEXIDINE GLUCONATE CLOTH 2 % EX PADS
6.0000 | MEDICATED_PAD | Freq: Every day | CUTANEOUS | Status: DC
  Administered 2023-03-04 – 2023-03-08 (×4): 6 via TOPICAL

## 2023-03-04 MED ORDER — MIDAZOLAM HCL 2 MG/2ML IJ SOLN
INTRAMUSCULAR | Status: AC
  Filled 2023-03-04: qty 2

## 2023-03-04 NOTE — Progress Notes (Signed)
Harlon Ditty NP changed Vent settings prior to 0800 ABG draw. Ok to wait for ABG per NP

## 2023-03-04 NOTE — Progress Notes (Signed)
NAME:  Lindsey Benjamin, MRN:  [id number], DOB:  1966-04-13, LOS: 1 ADMISSION DATE:  03/03/2023, CONSULTATION DATE:  03/03/2023 REFERRING MD:  Dr. Anner Crete, CHIEF COMPLAINT:  Shortness of Breath   Brief Pt Description / Synopsis:  57 y.o. female admitted with Acute on Chronic Hypoxic & Hypercapnic Respiratory Failure in setting of Acute Decompensated HFpEF and suspected AECOPD, along with Acute Metabolic Encephalopathy due to CO2 Narcosis, requiring intubation and mechanical ventilation.  History of Present Illness:  Lindsey Benjamin is a 57 year old female with a past medical history significant for chronic hypoxic respiratory failure on 6 L supplemental oxygen at baseline, COPD, severe OSA requiring BiPAP nightly, hypertension, diabetes mellitus type 2 who presented to [facility] ED on 03/03/2023 due to complaints of shortness of breath.  Patient is currently intubated and sedated and unable to contribute to history with no family at bedside, therefore history is obtained from chart review.  It is reported that she was found by her home health nurse today very lethargic and hypoxic with O2 saturations of 70% on room air, not wearing her normal supplemental 6 L.  The nurse placed her back on 6 L with improvement of sats to the mid 80s, which she was placed on nonrebreather with EMS with improvement to 99%.  She denied chest pain, cough, sputum production, fever, chills, abdominal pain, nausea, vomiting, diarrhea, dysuria.  ED Course: Initial Vital Signs: Temperature 97.6 F orally, pulse 66, respiratory rate 18, blood pressure 139/108, SpO2 96% on nonrebreather Significant Labs: Bicarbonate 39, glucose 100, BNP 1751, WBC 6.3, hemoglobin 16.4 VBG: pH 7.32/pCO2 99/pO2 63/bicarb 51 COVID-19/RSV/flu PCR negative Imaging Chest X-ray>>IMPRESSION: Enlarged cardiopericardial silhouette with some vascular congestion. Medications Administered: Bronchodilators and IV Decadron  Given her hypercapnia and respiratory  status she was placed on BiPAP in the ED.  Repeat ABG after 1 hour showed worsening hypercapnia with CO2 greater than 123 and worsening acidosis of 7.14.  Decision was made by ED provider to intubate.  PCCM was asked to admit for further workup and treatment.  Please see "Significant Hospital Events" section below for full detailed hospital course.  Pertinent  Medical History   Past Medical History:  Diagnosis Date   Asthma    Hypertension    Hypothyroidism    Type 2 diabetes mellitus without complication, without long-term current use of insulin (HCC) 03/04/2021    Micro Data:  8/29: SARS-CoV-2/RSV/Flu PCR>>negative 8/29: Respiratory viral panel>>negative 8/29: Tracheal aspirate>>gram + cocci 8/29: MRSA PCR>>negative  Antimicrobials:   Anti-infectives (From admission, onward)    None       Significant Hospital Events: Including procedures, antibiotic start and stop dates in addition to other pertinent events   8/29: Presented to ED, failed trial of BiPAP requiring intubation in ED.  PCCM asked to admit 8/30: On minimal vent settings, plan for WUA & SBT as tolerated.  Start Precedex due to delirium. Diurese with 40 mg IV Lasix x1 dose.  Interim History / Subjective:  -No significant events noted overnight -Afebrile, hemodynamically stable, Levophed has been weaned off this morning -Tracheal aspirate with gram + cocci ~ no leukocytosis, procalcitonin negative x2, and afebrile ~ will hold off on ABX for now with low threshold to initiate -On minimal vent settings ~ perform WUA and SBT as tolerated -Pt is delirious on WUA ~ start Precedex -Diurese with 40 mg IV Lasix x1 dose  Objective   Blood pressure 93/61, pulse (!) 54, temperature 98.6 F (37 C), resp. rate 16, height 4' 9.01" (1.448  m), weight 97.5 kg, last menstrual period 07/15/2016, SpO2 96%.    Vent Mode: PRVC FiO2 (%):  [20 %-60 %] 50 % Set Rate:  [12 bmp-25 bmp] 12 bmp Vt Set:  [330 mL-450 mL] 330 mL PEEP:  [8  cmH20] 8 cmH20 Plateau Pressure:  [19 cmH20-24 cmH20] 19 cmH20   Intake/Output Summary (Last 24 hours) at 03/04/2023 0725 Last data filed at 03/04/2023 0600 Gross per 24 hour  Intake 626.56 ml  Output 2160 ml  Net -1533.44 ml   Filed Weights   03/03/23 1211 03/03/23 1626 03/04/23 0500  Weight: 106.1 kg 101.1 kg 97.5 kg    Examination: General: Acute on chronically ill appearing obese female, laying in bed, intubated and sedated, in NAD HENT: Atraumatic, normocephalic, neck supple, difficult to assess JVD due to body habitus Lungs: Inspiratory wheeze to the left side otherwise mechanical sounds, vent assisted, occasionally overbreathes the vent, even Cardiovascular: Regular rate and rhythm, s1s2, no M/R/G Abdomen: Obese, soft, nontender, nondistended, no guarding or rebound tenderness, BS+ x4 Extremities: No deformities, 1+ edema BLE, no cyanosis, chronic tropic changes to BLE Neuro: Sedated, withdraws from pain, currently not following commands, pupils PERRL 1 mm bilaterally GU: Foley catheter in place draining clear yellow urine  Resolved Hospital Problem list     Assessment & Plan:   #Acute on Chronic Hypoxic & Hypercapnic Respiratory Failure due to Pulmonary Edema & suspected AECOPD PMHx: COPD,Asthma, on 6L supplemental O2 at baseline, OSA on BiPAP -Full vent support, implement lung protective strategies -Plateau pressures less than 30 cm H20 -Wean FiO2 & PEEP as tolerated to maintain O2 sats 88 to 92% -Follow intermittent Chest X-ray & ABG as needed -Spontaneous Breathing Trials when respiratory parameters met and mental status permits -Implement VAP Bundle -Bronchodilators & Pulmicort nebs -IV steroids -Diuresis as BP and renal function permits ~ give 40 mg IV Lasix x1 dose 8/30 -Consider Azithromycin/Rocephin for AECOPD ~  no leukocytosis, negative procalcitonin x2, and afebrle ~ holding off of empiric ABX for now with low threshold to begin  #Acute Decompensated  HFpEF #Hypertension -Continuous cardiac monitoring -Maintain MAP >65 -Cautious IV fluids -Vasopressors as needed to maintain MAP goal ~ not requiring currently  -Trend HS Troponin until peaked -Echocardiogram pending -Diuresis as BP and renal function permits ~ give 40 mg IV Lasix x1 dose 8/30 -Hold home Atenolol, Spironolactone for now given risk for hypotension with sedation and diuresis  #Diabetes Mellitus Type II #Hypothyroidism -CBG's q4h; Target range of 140 to 180 -SSI -Follow ICU Hypo/Hyperglycemia protocol -Check TSH -Continue home synthroid  #Acute Metabolic Encephalopathy in setting of CO2 Narcosis #Sedation needs in setting of mechanical ventilation -Maintain a RASS goal of 0 to -1 -Fentanyl and Propofol as needed to maintain RASS goal -Start Precedex -Avoid sedating medications as able -Daily wake up assessment    Best Practice (right click and "Reselect all SmartList Selections" daily)   Diet/type: NPO, start tube feeds later today 8/30 if not weaning from vent DVT prophylaxis: LMWH GI prophylaxis: PPI Lines: N/A Foley:  Yes, and it is still needed Code Status:  full code Last date of multidisciplinary goals of care discussion [8/30]  Pt's only family is her husband who is demented.  Labs   CBC: Recent Labs  Lab 03/03/23 1227 03/04/23 0652  WBC 6.3 8.9  NEUTROABS 4.2  --   HGB 16.4* 16.4*  HCT 56.3* 53.2*  MCV 89.9 85.1  PLT 174 182    Basic Metabolic Panel: Recent Labs  Lab 03/03/23  1227  NA 136  K 4.7  CL 86*  CO2 39*  GLUCOSE 100*  BUN 10  CREATININE 0.81  CALCIUM 8.4*  MG 2.0   GFR: Estimated Creatinine Clearance: 73.4 mL/min (by C-G formula based on SCr of 0.81 mg/dL). Recent Labs  Lab 03/03/23 1227 03/03/23 1653 03/04/23 0652  PROCALCITON  --  <0.10  --   WBC 6.3  --  8.9    Liver Function Tests: No results for input(s): "AST", "ALT", "ALKPHOS", "BILITOT", "PROT", "ALBUMIN" in the last 168 hours. No results for  input(s): "LIPASE", "AMYLASE" in the last 168 hours. No results for input(s): "AMMONIA" in the last 168 hours.  ABG    Component Value Date/Time   PHART 7.52 (H) 03/04/2023 0443   PCO2ART 56 (H) 03/04/2023 0443   PO2ART 72 (L) 03/04/2023 0443   HCO3 45.7 (H) 03/04/2023 0443   O2SAT 96.9 03/04/2023 0443     Coagulation Profile: No results for input(s): "INR", "PROTIME" in the last 168 hours.  Cardiac Enzymes: No results for input(s): "CKTOTAL", "CKMB", "CKMBINDEX", "TROPONINI" in the last 168 hours.  HbA1C: Hemoglobin A1C  Date/Time Value Ref Range Status  08/04/2022 10:25 AM 7.5 (A) 4.0 - 5.6 % Final  02/23/2022 10:45 AM 7.3 (A) 4.0 - 5.6 % Final  07/11/2015 12:00 AM 6.3  Final   Hgb A1c MFr Bld  Date/Time Value Ref Range Status  11/04/2021 09:56 AM 7.6 (H) 4.8 - 5.6 % Final    Comment:             Prediabetes: 5.7 - 6.4          Diabetes: >6.4          Glycemic control for adults with diabetes: <7.0   06/10/2021 11:14 AM 6.9 (H) 4.8 - 5.6 % Final    Comment:             Prediabetes: 5.7 - 6.4          Diabetes: >6.4          Glycemic control for adults with diabetes: <7.0     CBG: Recent Labs  Lab 03/03/23 1623 03/03/23 1931 03/04/23 0023 03/04/23 0347 03/04/23 0526  GLUCAP 128* 187* 168* 167* 145*    Review of Systems:   Unable to assess due to intubation/sedation/critical illness   Past Medical History:  She,  has a past medical history of Asthma, Hypertension, Hypothyroidism, and Type 2 diabetes mellitus without complication, without long-term current use of insulin (HCC) (03/04/2021).   Surgical History:   Past Surgical History:  Procedure Laterality Date   CARDIAC CATHETERIZATION  11/2020   CESAREAN SECTION     CHOLECYSTECTOMY     FOOT SURGERY     INNER EAR SURGERY     tubes in ears and repair a hole in the eardrum     Social History:   reports that she has been smoking cigarettes. She has a 10 pack-year smoking history. She has never used  smokeless tobacco. She reports that she does not currently use alcohol. She reports that she does not use drugs.   Family History:  Her family history includes Cushing syndrome in her mother; Heart attack (age of onset: 59) in her father; Hyperlipidemia in her brother; Hypertension in her brother; Thyroid disease in her brother and mother. There is no history of Breast cancer.   Allergies Allergies  Allergen Reactions   Ofloxacin Swelling   Penicillins Anaphylaxis   Ranitidine Hives   Lisinopril Cough  Ceclor [Cefaclor] Hives   Zantac [Ranitidine Hcl]     hives   Levaquin [Levofloxacin In D5w] Rash    Peel her skin   Levofloxacin Other (See Comments) and Rash   Tomato Rash     Home Medications  Prior to Admission medications   Medication Sig Start Date End Date Taking? Authorizing Provider  acetaminophen (TYLENOL) 500 MG tablet Take 500 mg by mouth every 6 (six) hours as needed.    [provider]  albuterol (VENTOLIN HFA) 108 (90 Base) MCG/ACT inhaler INHALE 2 PUFFS BY MOUTH FOUR TIMES A DAY. REPLACES VENTOLIN. 11/02/22 11/02/23  Iloabachie, Chioma E, NP  atenolol (TENORMIN) 50 MG tablet Take 1 tablet (50 mg total) by mouth daily. 08/04/22 03/21/23  Iloabachie, Chioma E, NP  blood glucose meter kit and supplies KIT USE AS DIRECTED TO CHECK BLOOD SUGAR UP TO 4 TIMES DAILY. 11/11/21   Iloabachie, Chioma E, NP  Blood Pressure KIT USE AS DIRECTED DAILY. 11/11/21   Iloabachie, Chioma E, NP  DULoxetine (CYMBALTA) 60 MG capsule Take one capsule by mouth daily every evening 01/11/22     Fluticasone Propionate (FLONASE NA) OTC 10/07/19   [provider]  furosemide (LASIX) 20 MG tablet Take 1 tablet (20 mg total) by mouth once daily as needed (for weight gain more than 3 pounds in a day or 5 pounds in a week). 03/24/21     gabapentin (NEURONTIN) 600 MG tablet Take (1/2) tablet (300 mg total) by mouth once daily at bedtime. 08/11/21     gabapentin (NEURONTIN) 600 MG tablet 1/2 q HS  12/29/21     glipiZIDE (GLUCOTROL) 5 MG tablet Take 1 tablet (5 mg total) by mouth daily before breakfast. 11/02/22   Iloabachie, Chioma E, NP  glucose blood (RIGHTEST GS550 BLOOD GLUCOSE) test strip USE AS DIRECTED TO CHECK BLOOD SUGAR UP TO 4 TIMES DAILY. 02/09/22   Iloabachie, Chioma E, NP  hydrOXYzine (ATARAX) 10 MG tablet Take 1 to 2 tablets (10-20mg  total) by mouth once daily as needed for anxiety. 08/11/21     hydrOXYzine (ATARAX) 10 MG tablet 1 - 2 q day prn anxiety 12/29/21     lansoprazole (PREVACID) 30 MG capsule TAKE ONE CAPSULE BY MOUTH EVERY DAY AT [facility] 04/29/22 04/29/23  Iloabachie, Chioma E, NP  levothyroxine (SYNTHROID) 175 MCG tablet Take 1 tablet (175 mcg total) by mouth daily before breakfast. 08/04/22   Iloabachie, Chioma E, NP  losartan (COZAAR) 100 MG tablet Take 1 tablet (100 mg total) by mouth daily. 12/22/21 02/06/23  Iloabachie, Chioma E, NP  pravastatin (PRAVACHOL) 40 MG tablet Take 1 tablet (40 mg total) by mouth once daily. 11/02/22   Iloabachie, Chioma E, NP  pravastatin (PRAVACHOL) 40 MG tablet Take 1 tablet (40 mg total) by mouth daily. 11/01/22     Rightest GL300 Lancets MISC USE AS DIRECTED TO CHECK BLOOD SUGAR UP TO 4 TIMES DAILY. 02/09/22   Iloabachie, Chioma E, NP  spironolactone (ALDACTONE) 25 MG tablet Take 1 tablet (25 mg total) by mouth once daily. 03/24/21     Vitamin D, Ergocalciferol, (DRISDOL) 1.25 MG (50000 UNIT) CAPS capsule Take 1 capsule (50,000 Units total) by mouth every 7 (seven) days. 08/05/22   Iloabachie, Francoise Ceo, NP     Critical care time: 40 minutes     Harlon Ditty, AGACNP-BC [facility] Prefer epic messenger for cross cover needs If after hours, please call E-link

## 2023-03-04 NOTE — Progress Notes (Signed)
*  PRELIMINARY RESULTS* Echocardiogram 2D Echocardiogram has been performed.  Carolyne Fiscal 03/04/2023, 10:00AM

## 2023-03-04 NOTE — Progress Notes (Signed)
Patient intubated, SB, with pressures stable on pressors. Sedation decreased, patient able to follow commands, able to wean patient off sedation and stop pressors.  Patient not tolerating wake up, sedation restarted, pressors resumed per orders. MD aware.   Patient responding to pain, very confused and combative. Desats with ventilator dyssynchrony. PRN medications given per orders. MD aware.

## 2023-03-04 NOTE — TOC CM/SW Note (Addendum)
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 Cm called numbers provided for her husband [phone] and [phone]. No answer. Cm called pt PCP office Shan Levans and they only have husband listed. CM checked care everywhere and found [facility]. Rep gave cm another phone number of a friend Tyler Deis [phone]. Cm called this number and it is disconnected. Cm unable to find any additional contacts

## 2023-03-04 NOTE — Progress Notes (Addendum)
Initial Nutrition Assessment  DOCUMENTATION CODES:   Morbid obesity  INTERVENTION:   If pt does not extubate:  Vital 1.2@50ml /hr- Initiate at 60ml/hr and advance by 67ml/hr q 8 hours until goal rate is reached.   Free water flushes 30ml q4 hours to maintain tube patency   Regimen provides 1440kcal/day, 90g/day protein and 1135ml/day of free water.   Pt at low refeed risk; recommend monitor potassium, magnesium and phosphorus labs daily until stable  Daily weights   NUTRITION DIAGNOSIS:   Inadequate oral intake related to inability to eat (pt sedated and ventilated) as evidenced by NPO status.  GOAL:   Provide needs based on ASPEN/SCCM guidelines  MONITOR:   Vent status, Labs, Weight trends, I & O's, Skin, TF tolerance  REASON FOR ASSESSMENT:   Ventilator    ASSESSMENT:   57 y/o female with h/o CHF, COPD, HTN, hypothyroidism, OSA, HLD, DM and asthma who is admitted with COPD and CHF exacerbation.  Pt sedated and ventilated. OGT in place and appears to have been advanced. Plan is for possible extubation today. Will plan to initiate tube feeds if pt does not extubate. Per chart, pt appears weight stable at baseline. Pt is down 8lbs since admission but appears to be back at her UBW.   Medications reviewed and include: colace, lovenox, insulin, synthroid, solu-medrol, protonix, miralax  Labs reviewed: K 4.2 wnl, creat 1.04(H), P 4.5 wnl, Mg 1.7 wnl BNP- 1751.3(H)- 8/29 Cbgs- 121, 129, 145, 167, 168 x 2 hrs  AIC 7.5(H)- 1/31  Patient is currently intubated on ventilator support MV: 4.3 L/min Temp (24hrs), Avg:98.3 F (36.8 C), Min:95.2 F (35.1 C), Max:99.7 F (37.6 C)  Propofol: stopped   MAP- >14mmHg   UOP-   NUTRITION - FOCUSED PHYSICAL EXAM:  Flowsheet Row Most Recent Value  Orbital Region No depletion  Upper Arm Region No depletion  Thoracic and Lumbar Region No depletion  Buccal Region No depletion  Temple Region No depletion  Clavicle  Bone Region No depletion  Clavicle and Acromion Bone Region No depletion  Scapular Bone Region No depletion  Dorsal Hand No depletion  Patellar Region No depletion  Anterior Thigh Region No depletion  Posterior Calf Region No depletion  Edema (RD Assessment) Mild  Hair Reviewed  Eyes Reviewed  Mouth Reviewed  Skin Reviewed  Nails Reviewed   Diet Order:   Diet Order             Diet NPO time specified  Diet effective now                  EDUCATION NEEDS:   No education needs have been identified at this time  Skin:  Skin Assessment: Reviewed RN Assessment (ecchymosis)  Last BM:  pta  Height:   Ht Readings from Last 1 Encounters:  03/04/23 4' 9.01" (1.448 m)    Weight:   Wt Readings from Last 1 Encounters:  03/04/23 97.5 kg    Ideal Body Weight:  43 kg  BMI:  Body mass index is 46.5 kg/m.  Estimated Nutritional Needs:   Kcal:  1073-1365kcal/day  Protein:  >86g/day  Fluid:  1.3-1.5L/day  Betsey Holiday MS, RD, LDN Please refer to [facility] for RD and/or RD on-call/weekend/after hours pager

## 2023-03-04 NOTE — Progress Notes (Signed)
Increased PEEP to 8 due to continued/sustained desat to low 80's

## 2023-03-04 NOTE — Consult Note (Signed)
PHARMACY CONSULT NOTE - ELECTROLYTES  Pharmacy Consult for Electrolyte Monitoring and Replacement   Recent Labs: Height: 4' 9.01" (144.8 cm) Weight: 97.5 kg (214 lb 15.2 oz) IBW/kg (Calculated) : 38.62 Estimated Creatinine Clearance: 73.4 mL/min (by C-G formula based on SCr of 0.81 mg/dL). Potassium (mmol/L)  Date Value  03/03/2023 4.7   Magnesium (mg/dL)  Date Value  21/30/8657 1.7   Calcium (mg/dL)  Date Value  84/69/6295 8.4 (L)   Albumin (g/dL)  Date Value  28/41/3244 2.3 (L)  11/04/2021 4.4   Sodium (mmol/L)  Date Value  03/03/2023 136  11/04/2021 137    Assessment  Lindsey Benjamin is a 57 y.o. female presenting with shortness of breath. PMH significant for HTN, chronic respiratory failure on 6 L nasal cannula baseline and BiPAP overnight with severe OSA, DM2 . Pharmacy has been consulted to monitor and replace electrolytes.  Diet: NPO Pertinent medications: Intermittent IV Lasix  Goal of Therapy: Electrolytes WNL  Plan:  Mg 1.7, magnesium sulfate 2 g IV x 1 Check BMP, Mg, Phos with AM labs  Thank you for allowing pharmacy to be a part of this patient's care.  Tressie Ellis 03/04/2023 11:53 AM

## 2023-03-04 NOTE — Plan of Care (Signed)

## 2023-03-05 ENCOUNTER — Encounter: Payer: Self-pay | Admitting: Pulmonary Disease

## 2023-03-05 ENCOUNTER — Inpatient Hospital Stay (HOSPITAL_COMMUNITY)
Admit: 2023-03-05 | Discharge: 2023-03-05 | Disposition: A | Discharge status: Home / Self Care | Payer: Medicaid Other | Attending: Pulmonary Disease | Admitting: Pulmonary Disease

## 2023-03-05 DIAGNOSIS — I3139 Other pericardial effusion (noninflammatory): Secondary | ICD-10-CM

## 2023-03-05 DIAGNOSIS — J9622 Acute and chronic respiratory failure with hypercapnia: Secondary | ICD-10-CM | POA: Diagnosis not present

## 2023-03-05 DIAGNOSIS — J9621 Acute and chronic respiratory failure with hypoxia: Principal | ICD-10-CM

## 2023-03-05 LAB — RENAL FUNCTION PANEL
Albumin: 2.8 g/dL — ABNORMAL LOW (ref 3.5–5.0)
Anion gap: 10 (ref 5–15)
BUN: 17 mg/dL (ref 6–20)
CO2: 41 mmol/L — ABNORMAL HIGH (ref 22–32)
Calcium: 8.1 mg/dL — ABNORMAL LOW (ref 8.9–10.3)
Chloride: 86 mmol/L — ABNORMAL LOW (ref 98–111)
Creatinine, Ser: 1.15 mg/dL — ABNORMAL HIGH (ref 0.44–1.00)
GFR, Estimated: 55 mL/min — ABNORMAL LOW (ref >60)
Glucose, Bld: 84 mg/dL (ref 70–99)
Phosphorus: 5.3 mg/dL — ABNORMAL HIGH (ref 2.5–4.6)
Potassium: 3.9 mmol/L (ref 3.5–5.1)
Sodium: 137 mmol/L (ref 135–145)

## 2023-03-05 LAB — CBC
HCT: 50.1 % — ABNORMAL HIGH (ref 36.0–46.0)
Hemoglobin: 15.5 g/dL — ABNORMAL HIGH (ref 12.0–15.0)
MCH: 26.1 pg (ref 26.0–34.0)
MCHC: 30.9 g/dL (ref 30.0–36.0)
MCV: 84.2 fL (ref 80.0–100.0)
Platelets: 178 10*3/uL (ref 150–400)
RBC: 5.95 MIL/uL — ABNORMAL HIGH (ref 3.87–5.11)
RDW: 17 % — ABNORMAL HIGH (ref 11.5–15.5)
WBC: 9.9 10*3/uL (ref 4.0–10.5)
nRBC: 0 % (ref 0.0–0.2)

## 2023-03-05 LAB — ECHOCARDIOGRAM LIMITED
Height: 57.008 in
S' Lateral: 2.2 cm
Weight: 3209.9 [oz_av]

## 2023-03-05 LAB — GLUCOSE, CAPILLARY
Glucose-Capillary: 119 mg/dL — ABNORMAL HIGH (ref 70–99)
Glucose-Capillary: 94 mg/dL (ref 70–99)
Glucose-Capillary: 144 mg/dL — ABNORMAL HIGH (ref 70–99)
Glucose-Capillary: 132 mg/dL — ABNORMAL HIGH (ref 70–99)
Glucose-Capillary: 155 mg/dL — ABNORMAL HIGH (ref 70–99)
Glucose-Capillary: 140 mg/dL — ABNORMAL HIGH (ref 70–99)

## 2023-03-05 LAB — THYROID PANEL WITH TSH
Free Thyroxine Index: 0.5 — ABNORMAL LOW (ref 1.2–4.9)
T3 Uptake Ratio: 25 % (ref 24–39)
T4, Total: 2 ug/dL — ABNORMAL LOW (ref 4.5–12.0)
TSH: 15.3 u[IU]/mL — ABNORMAL HIGH (ref 0.450–4.500)

## 2023-03-05 LAB — PROCALCITONIN: Procalcitonin: <0.1 ng/mL

## 2023-03-05 LAB — MAGNESIUM: Magnesium: 2.3 mg/dL (ref 1.7–2.4)

## 2023-03-05 MED ORDER — SODIUM CHLORIDE 0.9 % IV SOLN
2.0000 g | Freq: Three times a day (TID) | INTRAVENOUS | Status: DC
  Administered 2023-03-05 – 2023-03-08 (×9): 2 g via INTRAVENOUS
  Filled 2023-03-05 (×10): qty 10

## 2023-03-05 MED ORDER — IPRATROPIUM-ALBUTEROL 0.5-2.5 (3) MG/3ML IN SOLN
3.0000 mL | Freq: Three times a day (TID) | RESPIRATORY_TRACT | Status: DC
  Administered 2023-03-05 – 2023-03-06 (×3): 3 mL via RESPIRATORY_TRACT
  Filled 2023-03-05 (×3): qty 3

## 2023-03-05 MED ORDER — HYDRALAZINE HCL 20 MG/ML IJ SOLN
5.0000 mg | INTRAMUSCULAR | Status: DC | PRN
  Administered 2023-03-06 – 2023-03-07 (×3): 5 mg via INTRAVENOUS
  Filled 2023-03-05 (×3): qty 1

## 2023-03-05 MED ORDER — FUROSEMIDE 10 MG/ML IJ SOLN
40.0000 mg | Freq: Once | INTRAMUSCULAR | Status: AC
  Administered 2023-03-05: 40 mg via INTRAVENOUS
  Filled 2023-03-05: qty 4

## 2023-03-05 MED ORDER — SODIUM CHLORIDE 0.9 % IV SOLN: 1.0000 g | INTRAVENOUS | Status: DC

## 2023-03-05 MED ORDER — DEXTROSE 5 % IV SOLN
250.0000 mg | INTRAVENOUS | Status: DC
  Filled 2023-03-05: qty 2.5

## 2023-03-05 MED ORDER — SODIUM CHLORIDE 0.9 % IV SOLN
2.0000 g | Freq: Three times a day (TID) | INTRAVENOUS | Status: DC
  Administered 2023-03-05: 2 g via INTRAVENOUS
  Filled 2023-03-05 (×2): qty 10

## 2023-03-05 MED ORDER — DEXTROSE 5 % IV SOLN
250.0000 mg | INTRAVENOUS | Status: DC
  Administered 2023-03-05 – 2023-03-08 (×4): 250 mg via INTRAVENOUS
  Filled 2023-03-05 (×4): qty 2.5

## 2023-03-05 NOTE — Evaluation (Signed)
Occupational Therapy Evaluation Patient Details Name: Lindsey Benjamin MRN: [id number] DOB: 1965-11-26 Today's Date: 03/05/2023   History of Present Illness Lindsey Benjamin is a 57 year old female with a past medical history significant for chronic hypoxic respiratory failure on 6 L supplemental oxygen at baseline, COPD, severe OSA requiring BiPAP nightly, hypertension, diabetes mellitus type 2 who presented to [facility] ED on 03/03/2023 due to complaints of shortness of breath. Pt was extubated on 8/31.   Clinical Impression   Patient agreeable to OT evaluation. Pt presenting with decreased strength, endurance, balance, and SOB impacting performance in ADLs and functional mobility. At baseline, pt reports normally being Mod I for ADLs and IND for functional mobility without an AD. She endorsed recent difficulty with ambulation, LB dressing, and hygiene. Pt is primary caregiver for disabled husband. Pt currently functioning at Mod-Max A for bed mobility, Min A for STS from EOB, and CGA-Min A to take side steps at EOB using RW. She required Max A for LB dressing and set up A for seated grooming tasks. Pt on 10L HFNC throughout session, VSS. Recommend skilled acute OT services to address deficits noted below. Pt would benefit from ongoing therapy upon discharge to maximize safety and independence with ADLs, functional mobility, decrease fall risk, and decrease caregiver burden.       If plan is discharge home, recommend the following: A little help with walking and/or transfers;A lot of help with bathing/dressing/bathroom;Assistance with cooking/housework;Assist for transportation;Help with stairs or ramp for entrance    Functional Status Assessment  Patient has had a recent decline in their functional status and demonstrates the ability to make significant improvements in function in a reasonable and predictable amount of time.  Equipment Recommendations  Other (comment) (defer to next venue of care)     Recommendations for Other Services       Precautions / Restrictions Precautions Precautions: Fall Precaution Comments: foley Restrictions Weight Bearing Restrictions: No      Mobility Bed Mobility Overal bed mobility: Needs Assistance Bed Mobility: Supine to Sit, Sit to Supine     Supine to sit: HOB elevated, Used rails, Mod assist (assist for trunk facilitation and hip positioning, limited due to BLE pain) Sit to supine: Max assist (assist for BLE management)        Transfers Overall transfer level: Needs assistance Equipment used: Rolling walker (2 wheels) Transfers: Sit to/from Stand Sit to Stand: Min assist (STS from EOB)        Balance Overall balance assessment: Needs assistance Sitting-balance support: Feet supported, Single extremity supported Sitting balance-Leahy Scale: Good     Standing balance support: Bilateral upper extremity supported, Reliant on assistive device for balance Standing balance-Leahy Scale: Fair         ADL either performed or assessed with clinical judgement   ADL Overall ADL's : Needs assistance/impaired     Grooming: Set up;Sitting;Wash/dry face       Lower Body Dressing: Maximal assistance;Sitting/lateral leans Lower Body Dressing Details (indicate cue type and reason): B socks  Toilet Transfer: Rolling walker (2 wheels);Minimal assistance;Contact guard assist Toilet Transfer Details (indicate cue type and reason): simulated, short ambulatory transfer  Toileting- Clothing Manipulation and Hygiene: Maximal assistance;Sit to/from stand Toileting - Clothing Manipulation Details (indicate cue type and reason): anticipate     Functional mobility during ADLs: Contact guard assist;Minimal assistance;Rolling walker (2 wheels) (to take ~4 side steps at EOB, limited due to lines/leads and pt reports of BLEs feeling weak)       Vision  Patient Visual Report: No change from baseline       Perception         Praxis          Pertinent Vitals/Pain Pain Assessment Pain Assessment: Faces Faces Pain Scale: Hurts little more Pain Location: IV on L forearm, BLEs Pain Descriptors / Indicators: Grimacing, Guarding, Discomfort, Sore Pain Intervention(s): Monitored during session, Repositioned     Extremity/Trunk Assessment Upper Extremity Assessment Upper Extremity Assessment: Generalized weakness   Lower Extremity Assessment Lower Extremity Assessment: Generalized weakness       Communication Communication Communication: No apparent difficulties Cueing Techniques: Verbal cues   Cognition Arousal: Alert Behavior During Therapy: WFL for tasks assessed/performed Overall Cognitive Status: Within Functional Limits for tasks assessed           General Comments: A&Ox4. Pt able to give home set up/PLOF, but correcting self at times and stating "I'm sorry I'm still kind of out of it, I haven't had anything to eat in two days".     General Comments       Exercises Other Exercises Other Exercises: OT provided education re: role of OT, OT POC, post acute recs, sitting up for all meals, EOB/OOB mobility with assistance, home/fall safety.     Shoulder Instructions      Home Living Family/patient expects to be discharged to:: Private residence Living Arrangements: Spouse/significant other Available Help at Discharge:  (Spouse is disabled (in w/c) and has dementia) Type of Home: Apartment Home Access: Level entry     Home Layout: One level     Bathroom Shower/Tub: Chief Strategy Officer: Standard     Home Equipment: Shower seat   Additional Comments: Per chart, HH nurse was coming to the house.      Prior Functioning/Environment Prior Level of Function : Independent/Modified Independent             Mobility Comments: Pt reports normally IND without an AD, but recently having difficulty walking. Does not drive, was taking the bus. ADLs Comments: Pt normally Mod I, but recently  having difficulty with LBD and hygiene. Gets groceries delivered. Pt is primary caregiver for husband.        OT Problem List: Decreased strength;Decreased activity tolerance;Impaired balance (sitting and/or standing);Pain;Cardiopulmonary status limiting activity;Obesity;Decreased knowledge of use of DME or AE      OT Treatment/Interventions: Self-care/ADL training;Therapeutic exercise;Energy conservation;DME and/or AE instruction;Therapeutic activities;Patient/family education;Balance training    OT Goals(Current goals can be found in the care plan section) Acute Rehab OT Goals Patient Stated Goal: return home OT Goal Formulation: With patient Time For Goal Achievement: 03/19/23 Potential to Achieve Goals: Good   OT Frequency: Min 1X/week    Co-evaluation              AM-PAC OT "6 Clicks" Daily Activity     Outcome Measure Help from another person eating meals?: None Help from another person taking care of personal grooming?: A Little Help from another person toileting, which includes using toliet, bedpan, or urinal?: A Lot Help from another person bathing (including washing, rinsing, drying)?: A Lot Help from another person to put on and taking off regular upper body clothing?: A Little Help from another person to put on and taking off regular lower body clothing?: A Lot 6 Click Score: 16   End of Session Equipment Utilized During Treatment: Gait belt;Rolling walker (2 wheels) Nurse Communication: Mobility status  Activity Tolerance: Patient tolerated treatment well;Patient limited by fatigue Patient left: in bed;with  call bell/phone within reach  OT Visit Diagnosis: Muscle weakness (generalized) (M62.81);Pain;Unsteadiness on feet (R26.81) Pain - Right/Left: Left Pain - part of body: Arm                Time: 4854-6270 OT Time Calculation (min): 35 min Charges:  OT General Charges $OT Visit: 1 Visit OT Evaluation $OT Eval Moderate Complexity: 1 Mod  [facility] MS, OTR/L ascom [phone]  03/05/23, 4:56 PM

## 2023-03-05 NOTE — Consult Note (Signed)
PHARMACY CONSULT NOTE - ELECTROLYTES  Pharmacy Consult for Electrolyte Monitoring and Replacement   Recent Labs: Height: 4' 9.01" (144.8 cm) Weight: 91 kg (200 lb 9.9 oz) IBW/kg (Calculated) : 38.62 Estimated Creatinine Clearance: 49.6 mL/min (A) (by C-G formula based on SCr of 1.15 mg/dL (H)). Potassium (mmol/L)  Date Value  03/05/2023 3.9   Magnesium (mg/dL)  Date Value  82/95/6213 2.3   Calcium (mg/dL)  Date Value  08/65/7846 8.1 (L)   Albumin (g/dL)  Date Value  96/29/5284 2.8 (L)  11/04/2021 4.4   Phosphorus (mg/dL)  Date Value  13/24/4010 5.3 (H)   Sodium (mmol/L)  Date Value  03/05/2023 137  11/04/2021 137    Assessment  Lindsey Benjamin is a 57 y.o. female presenting with shortness of breath. PMH significant for HTN, chronic respiratory failure on 6 L nasal cannula baseline and BiPAP overnight with severe OSA, DM2 . Pharmacy has been consulted to monitor and replace electrolytes.  Diet: NPO Pertinent medications: Intermittent IV Lasix  Goal of Therapy: Electrolytes WNL  Plan:  No replacement currently indicated Check BMP, Mg, Phos with AM labs  Thank you for allowing pharmacy to be a part of this patient's care.  Malaya Cagley A Dolan Xia 03/05/2023 8:09 AM

## 2023-03-05 NOTE — Procedures (Signed)
Extubation Procedure Note  Patient Details:   Name: Lindsey Benjamin DOB: 11-04-1965 MRN: [id number]   Airway Documentation:    Vent end date: 03/05/23 Vent end time: 0814   Evaluation  O2 sats: stable throughout Complications: No apparent complications Patient did tolerate procedure well. Bilateral Breath Sounds: Diminished   Yes able to cough and speak.  Extubated to Bipap per MD order.  [name] 03/05/2023, 10:43 AM

## 2023-03-05 NOTE — Progress Notes (Signed)
NAME:  Lindsey Benjamin, MRN:  [id number], DOB:  22-Apr-1966, LOS: 2 ADMISSION DATE:  03/03/2023, CONSULTATION DATE:  03/03/2023 REFERRING MD:  Dr. Anner Crete, CHIEF COMPLAINT:  Shortness of Breath   Brief Pt Description / Synopsis:  57 y.o. female admitted with Acute on Chronic Hypoxic & Hypercapnic Respiratory Failure in setting of Acute Decompensated HFpEF and suspected AECOPD, along with Acute Metabolic Encephalopathy due to CO2 Narcosis, requiring intubation and mechanical ventilation.  History of Present Illness:  Lindsey Benjamin is a 57 year old female with a past medical history significant for chronic hypoxic respiratory failure on 6 L supplemental oxygen at baseline, COPD, severe OSA requiring BiPAP nightly, hypertension, diabetes mellitus type 2 who presented to [facility] ED on 03/03/2023 due to complaints of shortness of breath.  Patient is currently intubated and sedated and unable to contribute to history with no family at bedside, therefore history is obtained from chart review.  It is reported that she was found by her home health nurse today very lethargic and hypoxic with O2 saturations of 70% on room air, not wearing her normal supplemental 6 L.  The nurse placed her back on 6 L with improvement of sats to the mid 80s, which she was placed on nonrebreather with EMS with improvement to 99%.  She denied chest pain, cough, sputum production, fever, chills, abdominal pain, nausea, vomiting, diarrhea, dysuria.  ED Course: Initial Vital Signs: Temperature 97.6 F orally, pulse 66, respiratory rate 18, blood pressure 139/108, SpO2 96% on nonrebreather Significant Labs: Bicarbonate 39, glucose 100, BNP 1751, WBC 6.3, hemoglobin 16.4 VBG: pH 7.32/pCO2 99/pO2 63/bicarb 51 COVID-19/RSV/flu PCR negative Imaging Chest X-ray>>IMPRESSION: Enlarged cardiopericardial silhouette with some vascular congestion. Medications Administered: Bronchodilators and IV Decadron  Given her hypercapnia and respiratory  status she was placed on BiPAP in the ED.  Repeat ABG after 1 hour showed worsening hypercapnia with CO2 greater than 123 and worsening acidosis of 7.14.  Decision was made by ED provider to intubate.  PCCM was asked to admit for further workup and treatment.  Please see "Significant Hospital Events" section below for full detailed hospital course.  Pertinent  Medical History   Past Medical History:  Diagnosis Date   Asthma    Hypertension    Hypothyroidism    Type 2 diabetes mellitus without complication, without long-term current use of insulin (HCC) 03/04/2021    Micro Data:  8/29: SARS-CoV-2/RSV/Flu PCR>>negative 8/29: Respiratory viral panel>>negative 8/29: Tracheal aspirate>>gram + cocci 8/29: MRSA PCR>>negative  Antimicrobials:   Anti-infectives (From admission, onward)    None       Significant Hospital Events: Including procedures, antibiotic start and stop dates in addition to other pertinent events   8/29: Presented to ED, failed trial of BiPAP requiring intubation in ED.  PCCM asked to admit 8/30: On minimal vent settings, plan for WUA & SBT as tolerated.  Start Precedex due to delirium. Diurese with 40 mg IV Lasix x1 dose. 8/31: doing well on SBT/SAT. Extubated.  Interim History / Subjective:  -No significant events noted overnight -SAT/SBT this morning  Objective   Blood pressure (!) 153/77, pulse (!) 57, temperature (!) 97.3 F (36.3 C), resp. rate 15, height 4' 9.01" (1.448 m), weight 91 kg, last menstrual period 07/15/2016, SpO2 99%.    Vent Mode: PSV;CPAP FiO2 (%):  [40 %-50 %] 40 % Set Rate:  [12 bmp] 12 bmp Vt Set:  [330 mL] 330 mL PEEP:  [5 cmH20-8 cmH20] 8 cmH20 Pressure Support:  [16 cmH20] 16 cmH20 Plateau  Pressure:  [20 cmH20] 20 cmH20   Intake/Output Summary (Last 24 hours) at 03/05/2023 0809 Last data filed at 03/05/2023 0600 Gross per 24 hour  Intake 579.6 ml  Output 1596 ml  Net -1016.4 ml   Filed Weights   03/03/23 1626 03/04/23  0500 03/05/23 0452  Weight: 101.1 kg 97.5 kg 91 kg    Examination: General: Acute on chronically ill appearing obese female, laying in bed, intubated and sedated, in NAD HENT: Atraumatic, normocephalic, neck supple, difficult to assess JVD due to body habitus Lungs: coarse BS bilaterally Cardiovascular: Regular rate and rhythm, s1s2, no M/R/G Abdomen: Obese, soft, nontender, nondistended, no guarding or rebound tenderness, BS+ x4 Extremities: No deformities, 1+ edema BLE, no cyanosis, chronic tropic changes to BLE Neuro: responsive and alert; clearly expressing desire to have tube removed GU: Foley catheter in place draining clear yellow urine  Resolved Hospital Problem list     Assessment & Plan:   #Acute on Chronic Hypoxic & Hypercapnic Respiratory Failure due to Pulmonary Edema & suspected AECOPD PMHx: COPD,Asthma, on 6L supplemental O2 at baseline, OSA on BiPAP -Extubate -Start rocephin and azithromycin for presumed CAP/AECOPD -Continue steroids daily  #Acute Decompensated HFpEF #Hypertension #Pericardial effusion -Continuous cardiac monitoring -Maintain MAP >65 -Diuresis continued -Hold home Atenolol, Spironolactone for now given risk for hypotension with sedation and diuresis -Hydralazine prn ordered -Follow up echo as patient was noted to have a moderate pericardial effusion on 8/30 which I suspect is inflammatory today  #Diabetes Mellitus Type II #Hypothyroidism -CBG's q4h; Target range of 140 to 180 -SSI -Follow ICU Hypo/Hyperglycemia protocol -Check TSH -Continue home synthroid  #Acute Metabolic Encephalopathy in setting of CO2 Narcosis #Sedation needs in setting of mechanical ventilation -Plan for extubation  Best Practice (right click and "Reselect all SmartList Selections" daily)   Diet/type: NPO, start tube feeds later today 8/30 if not weaning from vent DVT prophylaxis: LMWH GI prophylaxis: PPI Lines: N/A Foley:  Yes, and it is still needed Code  Status:  full code Last date of multidisciplinary goals of care discussion [8/30]  Pt's only family is her husband who is demented.  Labs   CBC: Recent Labs  Lab 03/03/23 1227 03/04/23 0652 03/05/23 0528  WBC 6.3 8.9 9.9  NEUTROABS 4.2  --   --   HGB 16.4* 16.4* 15.5*  HCT 56.3* 53.2* 50.1*  MCV 89.9 85.1 84.2  PLT 174 182 178    Basic Metabolic Panel: Recent Labs  Lab 03/03/23 1227 03/04/23 0652 03/05/23 0528  NA 136 135 137  K 4.7 4.2 3.9  CL 86* 86* 86*  CO2 39* 34* 41*  GLUCOSE 100* 118* 84  BUN 10 11 17   CREATININE 0.81 1.04* 1.15*  CALCIUM 8.4* 8.2* 8.1*  MG 2.0 1.7 2.3  PHOS  --  4.5 5.3*   GFR: Estimated Creatinine Clearance: 49.6 mL/min (A) (by C-G formula based on SCr of 1.15 mg/dL (H)). Recent Labs  Lab 03/03/23 1227 03/03/23 1653 03/04/23 0652 03/05/23 0528  PROCALCITON  --  <0.10 <0.10 <0.10  WBC 6.3  --  8.9 9.9    Liver Function Tests: Recent Labs  Lab 03/04/23 0652 03/05/23 0528  AST 34  --   ALT 19  --   ALKPHOS 46  --   BILITOT 0.9  --   PROT 5.5*  --   ALBUMIN 2.7*  2.3* 2.8*   No results for input(s): "LIPASE", "AMYLASE" in the last 168 hours. No results for input(s): "AMMONIA" in the last 168 hours.  ABG    Component Value Date/Time   PHART 7.4 03/04/2023 0917   PCO2ART 77 (HH) 03/04/2023 0917   PO2ART 64 (L) 03/04/2023 0917   HCO3 47.7 (H) 03/04/2023 0917   O2SAT 93 03/04/2023 0917     Coagulation Profile: No results for input(s): "INR", "PROTIME" in the last 168 hours.  Cardiac Enzymes: No results for input(s): "CKTOTAL", "CKMB", "CKMBINDEX", "TROPONINI" in the last 168 hours.  HbA1C: Hemoglobin A1C  Date/Time Value Ref Range Status  08/04/2022 10:25 AM 7.5 (A) 4.0 - 5.6 % Final  02/23/2022 10:45 AM 7.3 (A) 4.0 - 5.6 % Final  07/11/2015 12:00 AM 6.3  Final   Hgb A1c MFr Bld  Date/Time Value Ref Range Status  03/04/2023 06:52 AM 7.3 (H) 4.8 - 5.6 % Final    Comment:    (NOTE) Pre diabetes:           5.7%-6.4%  Diabetes:              >6.4%  Glycemic control for   <7.0% adults with diabetes   11/04/2021 09:56 AM 7.6 (H) 4.8 - 5.6 % Final    Comment:             Prediabetes: 5.7 - 6.4          Diabetes: >6.4          Glycemic control for adults with diabetes: <7.0     CBG: Recent Labs  Lab 03/04/23 1558 03/04/23 1921 03/04/23 2325 03/05/23 0417 03/05/23 0744  GLUCAP 157* 133* 105* 119* 94    Review of Systems:   Unable to assess due to intubation/sedation/critical illness   Past Medical History:  She,  has a past medical history of Asthma, Hypertension, Hypothyroidism, and Type 2 diabetes mellitus without complication, without long-term current use of insulin (HCC) (03/04/2021).   Surgical History:   Past Surgical History:  Procedure Laterality Date   CARDIAC CATHETERIZATION  11/2020   CESAREAN SECTION     CHOLECYSTECTOMY     FOOT SURGERY     INNER EAR SURGERY     tubes in ears and repair a hole in the eardrum     Social History:   reports that she has been smoking cigarettes. She has a 10 pack-year smoking history. She has never used smokeless tobacco. She reports that she does not currently use alcohol. She reports that she does not use drugs.   Family History:  Her family history includes Cushing syndrome in her mother; Heart attack (age of onset: 49) in her father; Hyperlipidemia in her brother; Hypertension in her brother; Thyroid disease in her brother and mother. There is no history of Breast cancer.   Allergies Allergies  Allergen Reactions   Ofloxacin Swelling   Penicillins Anaphylaxis   Ranitidine Hives   Lisinopril Cough   Ceclor [Cefaclor] Hives   Zantac [Ranitidine Hcl]     hives   Levaquin [Levofloxacin In D5w] Rash    Peel her skin   Levofloxacin Other (See Comments) and Rash   Tomato Rash     Home Medications  Prior to Admission medications   Medication Sig Start Date End Date Taking? Authorizing Provider  acetaminophen (TYLENOL)  500 MG tablet Take 500 mg by mouth every 6 (six) hours as needed.    [provider]  albuterol (VENTOLIN HFA) 108 (90 Base) MCG/ACT inhaler INHALE 2 PUFFS BY MOUTH FOUR TIMES A DAY. REPLACES VENTOLIN. 11/02/22 11/02/23  Iloabachie, Chioma E, NP  atenolol (TENORMIN) 50 MG  tablet Take 1 tablet (50 mg total) by mouth daily. 08/04/22 03/21/23  Iloabachie, Chioma E, NP  blood glucose meter kit and supplies KIT USE AS DIRECTED TO CHECK BLOOD SUGAR UP TO 4 TIMES DAILY. 11/11/21   Iloabachie, Chioma E, NP  Blood Pressure KIT USE AS DIRECTED DAILY. 11/11/21   Iloabachie, Chioma E, NP  DULoxetine (CYMBALTA) 60 MG capsule Take one capsule by mouth daily every evening 01/11/22     Fluticasone Propionate (FLONASE NA) OTC 10/07/19   [provider]  furosemide (LASIX) 20 MG tablet Take 1 tablet (20 mg total) by mouth once daily as needed (for weight gain more than 3 pounds in a day or 5 pounds in a week). 03/24/21     gabapentin (NEURONTIN) 600 MG tablet Take (1/2) tablet (300 mg total) by mouth once daily at bedtime. 08/11/21     gabapentin (NEURONTIN) 600 MG tablet 1/2 q HS 12/29/21     glipiZIDE (GLUCOTROL) 5 MG tablet Take 1 tablet (5 mg total) by mouth daily before breakfast. 11/02/22   Iloabachie, Chioma E, NP  glucose blood (RIGHTEST GS550 BLOOD GLUCOSE) test strip USE AS DIRECTED TO CHECK BLOOD SUGAR UP TO 4 TIMES DAILY. 02/09/22   Iloabachie, Chioma E, NP  hydrOXYzine (ATARAX) 10 MG tablet Take 1 to 2 tablets (10-20mg  total) by mouth once daily as needed for anxiety. 08/11/21     hydrOXYzine (ATARAX) 10 MG tablet 1 - 2 q day prn anxiety 12/29/21     lansoprazole (PREVACID) 30 MG capsule TAKE ONE CAPSULE BY MOUTH EVERY DAY AT [facility] 04/29/22 04/29/23  Iloabachie, Chioma E, NP  levothyroxine (SYNTHROID) 175 MCG tablet Take 1 tablet (175 mcg total) by mouth daily before breakfast. 08/04/22   Iloabachie, Chioma E, NP  losartan (COZAAR) 100 MG tablet Take 1 tablet (100 mg total) by mouth daily. 12/22/21 02/06/23   Iloabachie, Chioma E, NP  pravastatin (PRAVACHOL) 40 MG tablet Take 1 tablet (40 mg total) by mouth once daily. 11/02/22   Iloabachie, Chioma E, NP  pravastatin (PRAVACHOL) 40 MG tablet Take 1 tablet (40 mg total) by mouth daily. 11/01/22     Rightest GL300 Lancets MISC USE AS DIRECTED TO CHECK BLOOD SUGAR UP TO 4 TIMES DAILY. 02/09/22   Iloabachie, Chioma E, NP  spironolactone (ALDACTONE) 25 MG tablet Take 1 tablet (25 mg total) by mouth once daily. 03/24/21     Vitamin D, Ergocalciferol, (DRISDOL) 1.25 MG (50000 UNIT) CAPS capsule Take 1 capsule (50,000 Units total) by mouth every 7 (seven) days. 08/05/22   Iloabachie, Chioma E, NP     Critical care time: 50 minutes    Rhea Bleacher MD Pulmonary & Critical Care Medicine

## 2023-03-05 NOTE — Progress Notes (Signed)
Shift Summary: Patient has had poor UOP this shift, total. SB throughout the shift, upper 50s. Patient able to follow commands on sedation. Wake up initiated with fentanyl on 15 and Propofol down to 10, pt started to get restless but still tolerating. No SBT done. Sedation returned to previous rates, patient remains alert. BP 160s systolic, all other VSS.

## 2023-03-05 NOTE — Plan of Care (Signed)

## 2023-03-05 NOTE — Progress Notes (Signed)
Patient alert, following commands, tolerating SBT. Patient extubated to bipap, tolerating well. Switched to HF [place], 10L. Passed Yale swallow study. Tolerated liquids, advanced to mechanical soft diet. Tolerating well. Oxygen weaned down to base 7L Alpha. Patient ambulated in room with PT, tolerated well. Patient resting in bed, watching television.

## 2023-03-05 NOTE — Progress Notes (Signed)
  Echocardiogram 2D Echocardiogram has been performed. A Limited Echo was requested and completed.  Lenor Coffin 03/05/2023, 7:44 AM

## 2023-03-06 ENCOUNTER — Encounter: Payer: Self-pay | Admitting: Pulmonary Disease

## 2023-03-06 ENCOUNTER — Inpatient Hospital Stay (HOSPITAL_COMMUNITY)
Admit: 2023-03-06 | Discharge: 2023-03-06 | Disposition: A | Discharge status: Home / Self Care | Payer: Medicaid Other | Attending: Cardiology | Admitting: Cardiology

## 2023-03-06 DIAGNOSIS — J9622 Acute and chronic respiratory failure with hypercapnia: Secondary | ICD-10-CM | POA: Diagnosis not present

## 2023-03-06 DIAGNOSIS — I10 Essential (primary) hypertension: Secondary | ICD-10-CM | POA: Diagnosis not present

## 2023-03-06 DIAGNOSIS — I3139 Other pericardial effusion (noninflammatory): Secondary | ICD-10-CM

## 2023-03-06 DIAGNOSIS — I5032 Chronic diastolic (congestive) heart failure: Secondary | ICD-10-CM

## 2023-03-06 DIAGNOSIS — G4733 Obstructive sleep apnea (adult) (pediatric): Secondary | ICD-10-CM | POA: Diagnosis not present

## 2023-03-06 DIAGNOSIS — J9602 Acute respiratory failure with hypercapnia: Principal | ICD-10-CM

## 2023-03-06 DIAGNOSIS — E038 Other specified hypothyroidism: Secondary | ICD-10-CM

## 2023-03-06 DIAGNOSIS — I5033 Acute on chronic diastolic (congestive) heart failure: Secondary | ICD-10-CM | POA: Diagnosis not present

## 2023-03-06 DIAGNOSIS — J9621 Acute and chronic respiratory failure with hypoxia: Secondary | ICD-10-CM | POA: Diagnosis not present

## 2023-03-06 LAB — CBC
HCT: 50.2 % — ABNORMAL HIGH (ref 36.0–46.0)
Hemoglobin: 15.2 g/dL — ABNORMAL HIGH (ref 12.0–15.0)
MCH: 25.8 pg — ABNORMAL LOW (ref 26.0–34.0)
MCHC: 30.3 g/dL (ref 30.0–36.0)
MCV: 85.1 fL (ref 80.0–100.0)
Platelets: 160 10*3/uL (ref 150–400)
RBC: 5.9 MIL/uL — ABNORMAL HIGH (ref 3.87–5.11)
RDW: 16.9 % — ABNORMAL HIGH (ref 11.5–15.5)
WBC: 12.5 10*3/uL — ABNORMAL HIGH (ref 4.0–10.5)
nRBC: 0 % (ref 0.0–0.2)

## 2023-03-06 LAB — RENAL FUNCTION PANEL
Albumin: 3.1 g/dL — ABNORMAL LOW (ref 3.5–5.0)
Anion gap: 10 (ref 5–15)
BUN: 19 mg/dL (ref 6–20)
CO2: 43 mmol/L — ABNORMAL HIGH (ref 22–32)
Calcium: 8.3 mg/dL — ABNORMAL LOW (ref 8.9–10.3)
Chloride: 83 mmol/L — ABNORMAL LOW (ref 98–111)
Creatinine, Ser: 1.02 mg/dL — ABNORMAL HIGH (ref 0.44–1.00)
GFR, Estimated: >60 mL/min (ref >60)
Glucose, Bld: 84 mg/dL (ref 70–99)
Phosphorus: 4.7 mg/dL — ABNORMAL HIGH (ref 2.5–4.6)
Potassium: 3.7 mmol/L (ref 3.5–5.1)
Sodium: 136 mmol/L (ref 135–145)

## 2023-03-06 LAB — CULTURE, RESPIRATORY W GRAM STAIN: Culture: NORMAL

## 2023-03-06 LAB — GLUCOSE, CAPILLARY
Glucose-Capillary: 97 mg/dL (ref 70–99)
Glucose-Capillary: 95 mg/dL (ref 70–99)
Glucose-Capillary: 153 mg/dL — ABNORMAL HIGH (ref 70–99)
Glucose-Capillary: 170 mg/dL — ABNORMAL HIGH (ref 70–99)
Glucose-Capillary: 213 mg/dL — ABNORMAL HIGH (ref 70–99)

## 2023-03-06 LAB — C-REACTIVE PROTEIN: CRP: 3.2 mg/dL — ABNORMAL HIGH (ref <1.0)

## 2023-03-06 MED ORDER — INSULIN ASPART 100 UNIT/ML IJ SOLN
0.0000 [IU] | Freq: Every day | INTRAMUSCULAR | Status: DC
  Administered 2023-03-06 – 2023-03-07 (×2): 2 [IU] via SUBCUTANEOUS
  Filled 2023-03-06: qty 1

## 2023-03-06 MED ORDER — PRAVASTATIN SODIUM 40 MG PO TABS
40.0000 mg | ORAL_TABLET | Freq: Every day | ORAL | Status: DC
  Administered 2023-03-06 – 2023-03-07 (×2): 40 mg via ORAL
  Filled 2023-03-06: qty 1
  Filled 2023-03-06: qty 2

## 2023-03-06 MED ORDER — FUROSEMIDE 10 MG/ML IJ SOLN
40.0000 mg | Freq: Every day | INTRAMUSCULAR | Status: DC
  Administered 2023-03-06 – 2023-03-08 (×3): 40 mg via INTRAVENOUS
  Filled 2023-03-06 (×3): qty 4

## 2023-03-06 MED ORDER — INSULIN ASPART 100 UNIT/ML IJ SOLN
0.0000 [IU] | Freq: Three times a day (TID) | INTRAMUSCULAR | Status: DC
  Administered 2023-03-06 – 2023-03-07 (×2): 2 [IU] via SUBCUTANEOUS
  Administered 2023-03-07: 3 [IU] via SUBCUTANEOUS
  Administered 2023-03-08: 2 [IU] via SUBCUTANEOUS
  Filled 2023-03-06 (×4): qty 1

## 2023-03-06 MED ORDER — IPRATROPIUM-ALBUTEROL 0.5-2.5 (3) MG/3ML IN SOLN
3.0000 mL | Freq: Two times a day (BID) | RESPIRATORY_TRACT | Status: DC
  Administered 2023-03-07 – 2023-03-08 (×3): 3 mL via RESPIRATORY_TRACT
  Filled 2023-03-06 (×3): qty 3

## 2023-03-06 MED ORDER — POLYETHYLENE GLYCOL 3350 17 G PO PACK
17.0000 g | PACK | Freq: Every day | ORAL | Status: DC
  Administered 2023-03-06 – 2023-03-07 (×2): 17 g via ORAL
  Filled 2023-03-06 (×2): qty 1

## 2023-03-06 MED ORDER — LOSARTAN POTASSIUM 50 MG PO TABS
50.0000 mg | ORAL_TABLET | Freq: Every day | ORAL | Status: DC
  Administered 2023-03-06: 50 mg via ORAL
  Filled 2023-03-06: qty 1

## 2023-03-06 MED ORDER — LEVOTHYROXINE SODIUM 50 MCG PO TABS
175.0000 ug | ORAL_TABLET | Freq: Every day | ORAL | Status: DC
  Administered 2023-03-07 – 2023-03-08 (×2): 175 ug via ORAL
  Filled 2023-03-06: qty 1
  Filled 2023-03-06: qty 2

## 2023-03-06 MED ORDER — SPIRONOLACTONE 25 MG PO TABS
25.0000 mg | ORAL_TABLET | Freq: Every day | ORAL | Status: DC
  Administered 2023-03-06 – 2023-03-08 (×3): 25 mg via ORAL
  Filled 2023-03-06 (×3): qty 1

## 2023-03-06 MED ORDER — DOCUSATE SODIUM 50 MG/5ML PO LIQD
100.0000 mg | Freq: Two times a day (BID) | ORAL | Status: DC
  Administered 2023-03-06 – 2023-03-07 (×4): 100 mg via ORAL
  Filled 2023-03-06 (×5): qty 10

## 2023-03-06 NOTE — Progress Notes (Signed)
PROGRESS NOTE  Lindsey Benjamin ZOX:[id number] DOB: 1966-01-11 DOA: 03/03/2023 PCP: [name], MD  HPI/Recap of past 24 hours: 57 y.o. 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 Extubated on 03/05/23.  TRH assumed care on 03/06/23.  Hospital course complicated by pericardial effusion seen on 2D echo done on 03/05/2023, subsequently, cardiology was consulted.  03/06/2023: The patient was seen and examined at her bedside.  She was taken off the BiPAP this morning and switched to nasal cannula tolerating 3 L nasal cannula with O2 saturation of 93%.  Denies having any chest pain.  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 Continue Aztreonam and azithromycin for presumed CAP/AECOPD -Continue steroids daily   #Acute Decompensated HFpEF #Hypertension # Large Pericardial effusion seen on 2D echo with no evidence of tamponade -Continuous cardiac monitoring -Maintain MAP >65 -Diuresis continued Cardiology consulted and is following. -C/w Losartan and Spironolactone per cardiology Continue to follow CRP and ANA Currently no indication for pericardiocentesis per cardiology. We appreciate cardiology's assistance.    #Diabetes Mellitus Type II with hyperglycemia #Hypothyroidism -Continue insulin coverage Target range of 140 to 180 TSH 15.3 will add free T4. TSH elevated suspect from noncompliance Continue home Synthroid   #Acute Metabolic Encephalopathy in setting of CO2 Narcosis #Sedation needs in setting of mechanical ventilation Extubated on 03/05/2023 Mentation is back to baseline.   Physical debility PT OT assessment Fall precautions   Time: 50 minutes.  Barriers to discharge: Ongoing medication adjustment by cardiology.  Pending CRP and ANA results.  Continue workup for large pericardial effusion.    Code Status: Full code  Family Communication: None at bedside  Disposition Plan: Likely will discharge to home once cardiology signed off within the next 24 to 48 hours.   Consultants: Cardiology PCCM  Procedures: Intubation Extubation 2D echo  Antimicrobials: Aztreonam and azithromycin  DVT prophylaxis: Subcu Lovenox daily  Status is: Inpatient The patient requires at least 2 midnights for further evaluation and treatment of present condition.    Objective: Vitals:   03/06/23 0800 03/06/23 0900 03/06/23 0904 03/06/23 1000  BP: (!) 147/64 (!) 149/72  (!) 165/74  Pulse: 64 66  71  Resp: (!) 8 (!) 21  18  Temp: (!) 96.9 F (36.1 C) (!) 96.6 F (35.9 C)    TempSrc: Axillary Bladder    SpO2: 94% (!) 88% 93% 95%  Weight:      Height:        Intake/Output Summary (Last 24 hours) at 03/06/2023 1248 Last data filed at 03/06/2023 0800 Gross per 24 hour  Intake 830 ml  Output 3515 ml  Net -2685 ml   Filed Weights   03/04/23 0500 03/05/23 0452 03/06/23 0500  Weight: 97.5 kg 91 kg 90.5 kg    Exam:  General: Lindsey y.o. year-old female well developed well nourished in no acute distress.  Alert and  oriented x3. Cardiovascular: Regular rate and rhythm with no rubs or gallops.  No thyromegaly or JVD noted.   Respiratory: Mild rales at bases.  Good inspiratory  effort. Abdomen: Soft nontender nondistended with normal bowel sounds x4 quadrants. Musculoskeletal: No lower extremity edema. 2/4 pulses in all 4 extremities. Skin: No ulcerative lesions noted or rashes, Psychiatry: Mood is appropriate for condition and setting   Data Reviewed: CBC: Recent Labs  Lab 03/03/23 1227 03/04/23 0652 03/05/23 0528 03/06/23 0335  WBC 6.3 8.9 9.9 12.5*  NEUTROABS 4.2  --   --   --   HGB 16.4* 16.4* 15.5* 15.2*  HCT 56.3* 53.2* 50.1* 50.2*  MCV 89.9 85.1 84.2 85.1  PLT 174 182 178 160   Basic Metabolic Panel: Recent Labs  Lab 03/03/23 1227 03/04/23 0652 03/05/23 0528 03/06/23 0335  NA 136 135 137 136  K 4.7 4.2 3.9 3.7  CL 86* 86* 86* 83*  CO2 39* 34* 41* 43*  GLUCOSE 100* 118* 84 84  BUN 10 11 17 19   CREATININE 0.81 1.04* 1.15* 1.02*  CALCIUM 8.4* 8.2* 8.1* 8.3*  MG 2.0 1.7 2.3  --   PHOS  --  4.5 5.3* 4.7*   GFR: Estimated Creatinine Clearance: 55.7 mL/min (A) (by C-G formula based on SCr of 1.02 mg/dL (H)). Liver Function Tests: Recent Labs  Lab 03/04/23 0652 03/05/23 0528 03/06/23 0335  AST 34  --   --   ALT 19  --   --   ALKPHOS 46  --   --   BILITOT 0.9  --   --   PROT 5.5*  --   --   ALBUMIN 2.7*  2.3* 2.8* 3.1*   No results for input(s): "LIPASE", "AMYLASE" in the last 168 hours. No results for input(s): "AMMONIA" in the last 168 hours. Coagulation Profile: No results for input(s): "INR", "PROTIME" in the last 168 hours. Cardiac Enzymes: No results for input(s): "CKTOTAL", "CKMB", "CKMBINDEX", "TROPONINI" in the last 168 hours. BNP (last 3 results) No results for input(s): "PROBNP" in the last 8760 hours. HbA1C: Recent Labs    03/04/23 0652  HGBA1C 7.3*   CBG: Recent Labs  Lab 03/05/23 1931 03/05/23 2327 03/06/23 0342 03/06/23 0757 03/06/23 1123  GLUCAP 155* 140* 97 95 153*   Lipid Profile: No results for input(s): "CHOL", "HDL", "LDLCALC", "TRIG", "CHOLHDL", "LDLDIRECT" in the last 72 hours. Thyroid  Function Tests: Recent Labs    03/04/23 0652  TSH 15.300*  T4TOTAL 2.0*   Anemia Panel: No results for input(s): "VITAMINB12", "FOLATE", "FERRITIN", "TIBC", "IRON", "RETICCTPCT" in the last 72 hours. Urine analysis:    Component Value Date/Time   COLORURINE YELLOW (A) 03/03/2023 1534   APPEARANCEUR HAZY (A) 03/03/2023 1534   APPEARANCEUR Cloudy (A) 11/11/2021 1244   LABSPEC 1.010 03/03/2023 1534   PHURINE 8.0 03/03/2023 1534   GLUCOSEU NEGATIVE 03/03/2023 1534   HGBUR NEGATIVE 03/03/2023 1534   BILIRUBINUR NEGATIVE 03/03/2023 1534   BILIRUBINUR Negative 11/11/2021 1244   KETONESUR NEGATIVE 03/03/2023 1534   PROTEINUR >=300 (A) 03/03/2023 1534   NITRITE POSITIVE (A) 03/03/2023 1534   LEUKOCYTESUR NEGATIVE 03/03/2023 1534   Sepsis Labs: @LABRCNTIP (procalcitonin:4,lacticidven:4)  ) Recent Results (from the past 240 hour(s))  Resp panel by RT-PCR (RSV, Flu A&B, Covid) Anterior Nasal Swab     Status: None   Collection Time: 03/03/23 12:27 PM   Specimen: Anterior Nasal Swab  Result Value Ref Range Status   SARS Coronavirus 2 by RT PCR NEGATIVE NEGATIVE Final  Comment: (NOTE) SARS-CoV-2 target nucleic acids are NOT DETECTED.  The SARS-CoV-2 RNA is generally detectable in upper respiratory specimens during the acute phase of infection. The lowest concentration of SARS-CoV-2 viral copies this assay can detect is 138 copies/mL. A negative result does not preclude SARS-Cov-2 infection and should not be used as the sole basis for treatment or other patient management decisions. A negative result may occur with  improper specimen collection/handling, submission of specimen other than nasopharyngeal swab, presence of viral mutation(s) within the areas targeted by this assay, and inadequate number of viral copies(<138 copies/mL). A negative result must be combined with clinical observations, patient history, and epidemiological information. The expected result is  Negative.  Fact Sheet for Patients:  BloggerCourse.com  Fact Sheet for Healthcare Providers:  SeriousBroker.it  This test is no t yet approved or cleared by the [place] FDA and  has been authorized for detection and/or diagnosis of SARS-CoV-2 by FDA under an Emergency Use Authorization (EUA). This EUA will remain  in effect (meaning this test can be used) for the duration of the COVID-19 declaration under Section 564(b)(1) of the Act, 21 U.S.C.section 360bbb-3(b)(1), unless the authorization is terminated  or revoked sooner.       Influenza A by PCR NEGATIVE NEGATIVE Final   Influenza B by PCR NEGATIVE NEGATIVE Final    Comment: (NOTE) The Xpert Xpress SARS-CoV-2/FLU/RSV plus assay is intended as an aid in the diagnosis of influenza from Nasopharyngeal swab specimens and should not be used as a sole basis for treatment. Nasal washings and aspirates are unacceptable for Xpert Xpress SARS-CoV-2/FLU/RSV testing.  Fact Sheet for Patients: BloggerCourse.com  Fact Sheet for Healthcare Providers: SeriousBroker.it  This test is not yet approved or cleared by the [place] FDA and has been authorized for detection and/or diagnosis of SARS-CoV-2 by FDA under an Emergency Use Authorization (EUA). This EUA will remain in effect (meaning this test can be used) for the duration of the COVID-19 declaration under Section 564(b)(1) of the Act, 21 U.S.C. section 360bbb-3(b)(1), unless the authorization is terminated or revoked.     Resp Syncytial Virus by PCR NEGATIVE NEGATIVE Final    Comment: (NOTE) Fact Sheet for Patients: BloggerCourse.com  Fact Sheet for Healthcare Providers: SeriousBroker.it  This test is not yet approved or cleared by the [place] FDA and has been authorized for detection and/or diagnosis of  SARS-CoV-2 by FDA under an Emergency Use Authorization (EUA). This EUA will remain in effect (meaning this test can be used) for the duration of the COVID-19 declaration under Section 564(b)(1) of the Act, 21 U.S.C. section 360bbb-3(b)(1), unless the authorization is terminated or revoked.  Performed at [facility], [address] [place], [place] [address]   Culture, Respiratory w Gram Stain     Status: None (Preliminary result)   Collection Time: 03/03/23  3:47 PM   Specimen: SPU; Respiratory  Result Value Ref Range Status   Specimen Description   Final    SPUTUM Performed at [facility], [address] [place], [place] [address]    Special Requests   Final    TASP Performed at [facility], [address] [place], [place] [address]    Gram Stain   Final    FEW WBC PRESENT, PREDOMINANTLY MONONUCLEAR FEW GRAM POSITIVE COCCI IN PAIRS    Culture   Final    Normal respiratory flora-no Staph aureus or Pseudomonas seen Performed at [facility], [address], [place], [place]  [address]    Report Status PENDING  Incomplete  MRSA Next Gen by PCR, Nasal     Status: None   Collection Time: 03/03/23  4:28 PM   Specimen: Nasal Mucosa; Nasal Swab  Result Value Ref Range Status   MRSA by PCR Next Gen NOT DETECTED NOT DETECTED Final    Comment: (NOTE) The GeneXpert MRSA Assay (FDA approved for NASAL specimens only), is one component of a comprehensive MRSA colonization surveillance program. It is not intended to diagnose MRSA infection nor to guide or monitor treatment for MRSA infections. Test performance is not FDA approved in patients less than 36 years old. Performed at [facility], [address] [place], [place] [address]   Respiratory (~20 pathogens) panel by PCR     Status: None   Collection Time: 03/03/23 11:06 PM   Specimen: Nasopharyngeal Swab; Respiratory  Result Value Ref Range Status   Adenovirus NOT DETECTED NOT  DETECTED Final   Coronavirus 229E NOT DETECTED NOT DETECTED Final    Comment: (NOTE) The Coronavirus on the Respiratory Panel, DOES NOT test for the novel  Coronavirus (2019 nCoV)    Coronavirus HKU1 NOT DETECTED NOT DETECTED Final   Coronavirus NL63 NOT DETECTED NOT DETECTED Final   Coronavirus OC43 NOT DETECTED NOT DETECTED Final   Metapneumovirus NOT DETECTED NOT DETECTED Final   Rhinovirus / Enterovirus NOT DETECTED NOT DETECTED Final   Influenza A NOT DETECTED NOT DETECTED Final   Influenza B NOT DETECTED NOT DETECTED Final   Parainfluenza Virus 1 NOT DETECTED NOT DETECTED Final   Parainfluenza Virus 2 NOT DETECTED NOT DETECTED Final   Parainfluenza Virus 3 NOT DETECTED NOT DETECTED Final   Parainfluenza Virus 4 NOT DETECTED NOT DETECTED Final   Respiratory Syncytial Virus NOT DETECTED NOT DETECTED Final   Bordetella pertussis NOT DETECTED NOT DETECTED Final   Bordetella Parapertussis NOT DETECTED NOT DETECTED Final   Chlamydophila pneumoniae NOT DETECTED NOT DETECTED Final   Mycoplasma pneumoniae NOT DETECTED NOT DETECTED Final    Comment: Performed at [facility], [address] [place], [place] [address]      Studies: No results found.  Scheduled Meds:  budesonide (PULMICORT) nebulizer solution  0.5 mg Nebulization BID   Chlorhexidine Gluconate Cloth  6 each Topical Daily   docusate  100 mg Oral BID   enoxaparin (LOVENOX) injection  50 mg Subcutaneous Q24H   furosemide  40 mg Intravenous Daily   insulin aspart  0-15 Units Subcutaneous Q4H   ipratropium-albuterol  3 mL Nebulization TID   [START ON 03/07/2023] levothyroxine  175 mcg Oral QAC breakfast   losartan  50 mg Oral Daily   methylPREDNISolone (SOLU-MEDROL) injection  40 mg Intravenous Daily   pantoprazole (PROTONIX) IV  40 mg Intravenous QHS   polyethylene glycol  17 g Oral Daily   pravastatin  40 mg Oral q1800   spironolactone  25 mg Oral Daily    Continuous Infusions:  sodium chloride Stopped  (03/04/23 1632)   azithromycin 250 mg (03/06/23 1108)   aztreonam 2 g (03/06/23 1220)   fentaNYL infusion INTRAVENOUS Stopped (03/05/23 0730)     LOS: 3 days     Darlin Drop, MD [facility] Pager [phone]  If 7PM-7AM, please contact night-coverage [email or web address] Password [facility] 03/06/2023, 12:48 PM

## 2023-03-06 NOTE — Plan of Care (Signed)

## 2023-03-06 NOTE — Progress Notes (Signed)
  Echocardiogram 2D Echocardiogram has been performed.  Neita Garnet Tiye Huwe 03/06/2023, 5:29 PM

## 2023-03-06 NOTE — Evaluation (Signed)
Physical Therapy Evaluation Patient Details Name: Lindsey Benjamin MRN: [id number] DOB: 10/16/1965 Today's Date: 03/06/2023  History of Present Illness  Lindsey Benjamin is a 57 year old female with a past medical history significant for chronic hypoxic respiratory failure on 6 L supplemental oxygen at baseline, COPD, severe OSA requiring BiPAP nightly, hypertension, diabetes mellitus type 2 who presented to [facility] ED on 03/03/2023 due to complaints of shortness of breath. Pt was extubated on 8/31.  Clinical Impression  Pt pleasant and eager to work with PT, showed good effort and did well with most tasks.  PT extubated ~24 hours earlier, able to circumambulate the unit with walker and 3L O2 relatively well this date.  She typically does not need AD and certainly needed one today during gait training but ultimately moving well and showing good relative safety and confidence with prolonged bout of ambulation.  Pt is not at baseline and will need continued PT to address functional limitations.      If plan is discharge home, recommend the following: Assistance with cooking/housework;Assist for transportation   Can travel by private vehicle        Equipment Recommendations Rolling walker (2 wheels)  Recommendations for Other Services       Functional Status Assessment Patient has had a recent decline in their functional status and demonstrates the ability to make significant improvements in function in a reasonable and predictable amount of time.     Precautions / Restrictions Precautions Precautions: Fall Restrictions Weight Bearing Restrictions: No      Mobility  Bed Mobility Overal bed mobility: Needs Assistance Bed Mobility: Supine to Sit     Supine to sit: Min assist, Mod assist     General bed mobility comments: HHA to pull up to EOB    Transfers   Equipment used: Rolling walker (2 wheels) Transfers: Sit to/from Stand Sit to Stand: Contact guard assist            General transfer comment: Pt's height relative to bed ment nearly in standing when feet contacted floor, did not need assist to rise to standing with walker    Ambulation/Gait Ambulation/Gait assistance: Supervision Gait Distance (Feet): 200 Feet Assistive device: Rolling walker (2 wheels)         General Gait Details: Pt typically does not need AD, reliant on UEs during this effort but ultimately did quite well.  3L O2 with SpO2 in the low 90s (down to 88 after prolonged walk) with HR staying in the 70-80 range and no overt safety issues or excess fatigue.  Stairs            Wheelchair Mobility     Tilt Bed    Modified Rankin (Stroke Patients Only)       Balance Overall balance assessment: Needs assistance Sitting-balance support: Feet supported, Single extremity supported Sitting balance-Leahy Scale: Good     Standing balance support: Bilateral upper extremity supported, Reliant on assistive device for balance Standing balance-Leahy Scale: Fair Standing balance comment: Pt does not typically need AD, used t/o ambulation 2/2 general weakness and unsteadiness but no overt LOBs or safety issues with b/l UEs on walker                             Pertinent Vitals/Pain Pain Assessment Pain Assessment: 0-10 Pain Score: 4  Pain Location: b/l upper thighs Pain Intervention(s): Limited activity within patient's tolerance    Home Living Family/patient expects to be discharged  to:: Private residence Living Arrangements: Spouse/significant other Available Help at Discharge: Available PRN/intermittently (sister lives up stairs but is apparently not a reliable source of assist)   Home Access: Level entry       Home Layout: One level Home Equipment: Shower seat      Prior Function Prior Level of Function : Independent/Modified Independent             Mobility Comments: Pt reports normally IND without an AD, but recently having difficulty walking.  Does not drive, was taking the bus. ADLs Comments: Pt normally Mod I, but recently having difficulty with LBD and hygiene. Gets groceries delivered. Pt is primary caregiver for husband.     Extremity/Trunk Assessment   Upper Extremity Assessment Upper Extremity Assessment: Generalized weakness;Overall [facility] for tasks assessed    Lower Extremity Assessment Lower Extremity Assessment: Generalized weakness;Overall WFL for tasks assessed       Communication   Communication Communication: No apparent difficulties  Cognition Arousal: Alert Behavior During Therapy: WFL for tasks assessed/performed Overall Cognitive Status: Within Functional Limits for tasks assessed                                 General Comments: A&Ox4. Pt able to give home set up/PLOF.        General Comments General comments (skin integrity, edema, etc.): Pt was able to tolerate a prolonged bout of ambulation (200 ft on 3L) with relative confidence and safety.  Not at baseline but moving well first real OOB activity since extubation.    Exercises     Assessment/Plan    PT Assessment Patient needs continued PT services  PT Problem List Decreased strength;Decreased activity tolerance;Decreased balance;Decreased mobility;Decreased knowledge of use of DME;Decreased safety awareness;Pain       PT Treatment Interventions DME instruction;Gait training;Functional mobility training;Therapeutic activities;Therapeutic exercise;Balance training;Patient/family education    PT Goals (Current goals can be found in the Care Plan section)  Acute Rehab PT Goals Patient Stated Goal: go home PT Goal Formulation: With patient Time For Goal Achievement: 03/20/23 Potential to Achieve Goals: Good    Frequency Min 1X/week     Co-evaluation               AM-PAC PT "6 Clicks" Mobility  Outcome Measure Help needed turning from your back to your side while in a flat bed without using bedrails?: A  Little Help needed moving from lying on your back to sitting on the side of a flat bed without using bedrails?: A Lot Help needed moving to and from a bed to a chair (including a wheelchair)?: None Help needed standing up from a chair using your arms (e.g., wheelchair or bedside chair)?: A Little Help needed to walk in hospital room?: None Help needed climbing 3-5 steps with a railing? : A Little 6 Click Score: 19    End of Session Equipment Utilized During Treatment: Gait belt;Oxygen (3L, down to high 80s with prolonged ambulation, quickly back to high 90s at rest) Activity Tolerance: Patient tolerated treatment well;Patient limited by fatigue Patient left: in chair Nurse Communication: Mobility status PT Visit Diagnosis: Muscle weakness (generalized) (M62.81);Difficulty in walking, not elsewhere classified (R26.2)    Time: 1610-9604 PT Time Calculation (min) (ACUTE ONLY): 44 min   Charges:   PT Evaluation $PT Eval Low Complexity: 1 Low PT Treatments $Gait Training: 8-22 mins $Therapeutic Activity: 8-22 mins PT General Charges $$ ACUTE PT VISIT: 1  Visit         Malachi Pro, DPT 03/06/2023, 10:15 AM

## 2023-03-06 NOTE — Consult Note (Signed)
Cardiology Consultation   Patient ID: GENEAL FARANDA MRN: [id number]; DOB: 25-Feb-1966  Admit date: 03/03/2023 Date of Consult: 03/06/2023  PCP:  [name], MD   [facility] Providers Cardiologist:  None      Previously followed by cardiologist at [facility]  Patient Profile:   AMOS FRERICKS is a 57 y.o. female with a hx of hypertension chronic chronic respiratory failure on 6 L of O2 via nasal cannula at baseline and BiPAP overnight for severe OSA, type 2 diabetes, asthma, COPD, hypothyroidism, obesity, tobacco abuse, who is being seen 03/06/2023 for the evaluation of pericardial effusion at the request of Dr. Margo Aye.  History of Present Illness:   Ms. Razo who has no history of significant coronary artery disease established by left heart catheterization completed 2022 at [facility].  She does have longstanding diastolic dysfunction noted on prior studies completed at outside facility. She presented to the [facility] emergency department on 03/03/2023 via EMS from home with shortness of breath and being overly tired.  She states she is receiving oxygen but she has not worn in the past month.  Community health nurse was at her house and called EMS due to the patient being so very sleepy.  Prior to arrival patient's initial SpO2 of 72% on room air.  EMS placed her on 4 to 6 L via nasal cannula with sats only improved to 89%.  She was then placed on nonrebreather with sats improving to 99%.  Patient's end-tidal was running between 60 and 79 with EMS.  After initial VBG she had to be placed on BiPAP for critical results with saturations in the 70s.  She continued to deny chest pain, cough, congestion, fever, chills, nausea or vomiting.Repeat ABG after 1 hour of BiPAP showed worsening hypercapnia and patient had to be intubated and underwent successful intubation and admitted to the ICU for further care.  Echocardiogram on 03/04/2023 revealed moderate pericardial effusion and repeat limited study  on 03/05/2023 revealed large pericardial effusion 2 cm from the LV free wall unchanged from 03/04/2023 study without evidence of cardiac tamponade.  She was doing well with stable vitals and tolerated SBT and was successfully extubated 03/05/2023.  On rounds this morning she is awake alert and talking with no complaints.  Vitals have remained stable and she has tolerated oxygen via nasal cannula since extubation  Initial vitals: Blood pressure 139/108, pulse of 66, respirations of 18, temperature 97.6  Pertinent labs hemoglobin 16.4, hematocrit of 56.3, chloride of 86, CO2 39, blood glucose 500, calcium 8.4, ABG with pH is 7.14, pCO2 greater than 123, pO2 of 177, HCO3 46, and acid-base excess of 11.4, BNP 1751.3, COVID/flu/RSV PCR swab negative, respiratory panel by PCR negative,TSH 58 (1/24) and 15.3 (03/04/23)  Imaging:chest x-ray revealed enlarged cardiopericardial silhouette with some vascular congestion  Medications given in the emergency department: Furosemide 40 mg IVP, etomidate Suggs propofol for rapid intubation, fentanyl drip, dexamethasone 10 mg IVP, albuterol and Atrovent nebulizer treatment  Cardiology was consulted on 03/06/2023 for further evaluation and management of pericardial effusion found on echocardiogram.    Past Medical History:  Diagnosis Date   (HFpEF) heart failure with preserved ejection fraction (HCC)    Asthma    Chronic hypoxic respiratory failure, on home oxygen therapy (HCC)    6L [place]   COPD (chronic obstructive pulmonary disease) (HCC)    Hypertension    Hypothyroidism    Morbid obesity with BMI of 40.0-44.9, adult (HCC)    OSA treated with  BiPAP    Smoker    Type 2 diabetes mellitus without complication, without long-term current use of insulin (HCC) 03/04/2021    Past Surgical History:  Procedure Laterality Date   CARDIAC CATHETERIZATION  11/2020   CESAREAN SECTION     CHOLECYSTECTOMY     FOOT SURGERY     INNER EAR SURGERY     tubes in ears and repair  a hole in the eardrum     Home Medications:  Prior to Admission medications   Medication Sig Start Date End Date Taking? Authorizing Provider  acetaminophen (TYLENOL) 500 MG tablet Take 500 mg by mouth every 6 (six) hours as needed.    [provider]  albuterol (VENTOLIN HFA) 108 (90 Base) MCG/ACT inhaler INHALE 2 PUFFS BY MOUTH FOUR TIMES A DAY. REPLACES VENTOLIN. 11/02/22 11/02/23  Iloabachie, Chioma E, NP  atenolol (TENORMIN) 50 MG tablet Take 1 tablet (50 mg total) by mouth daily. 08/04/22 03/21/23  Iloabachie, Chioma E, NP  blood glucose meter kit and supplies KIT USE AS DIRECTED TO CHECK BLOOD SUGAR UP TO 4 TIMES DAILY. 11/11/21   Iloabachie, Chioma E, NP  Blood Pressure KIT USE AS DIRECTED DAILY. 11/11/21   Iloabachie, Chioma E, NP  DULoxetine (CYMBALTA) 60 MG capsule Take one capsule by mouth daily every evening 01/11/22     Fluticasone Propionate (FLONASE NA) OTC 10/07/19   [provider]  furosemide (LASIX) 20 MG tablet Take 1 tablet (20 mg total) by mouth once daily as needed (for weight gain more than 3 pounds in a day or 5 pounds in a week). 03/24/21     gabapentin (NEURONTIN) 600 MG tablet Take (1/2) tablet (300 mg total) by mouth once daily at bedtime. 08/11/21     gabapentin (NEURONTIN) 600 MG tablet 1/2 q HS 12/29/21     glipiZIDE (GLUCOTROL) 5 MG tablet Take 1 tablet (5 mg total) by mouth daily before breakfast. 11/02/22   Iloabachie, Chioma E, NP  glucose blood (RIGHTEST GS550 BLOOD GLUCOSE) test strip USE AS DIRECTED TO CHECK BLOOD SUGAR UP TO 4 TIMES DAILY. 02/09/22   Iloabachie, Chioma E, NP  hydrOXYzine (ATARAX) 10 MG tablet Take 1 to 2 tablets (10-20mg  total) by mouth once daily as needed for anxiety. 08/11/21     hydrOXYzine (ATARAX) 10 MG tablet 1 - 2 q day prn anxiety 12/29/21     lansoprazole (PREVACID) 30 MG capsule TAKE ONE CAPSULE BY MOUTH EVERY DAY AT [facility] 04/29/22 04/29/23  Iloabachie, Chioma E, NP  levothyroxine (SYNTHROID) 175 MCG tablet Take 1 tablet (175 mcg  total) by mouth daily before breakfast. 08/04/22   Iloabachie, Chioma E, NP  losartan (COZAAR) 100 MG tablet Take 1 tablet (100 mg total) by mouth daily. 12/22/21 02/06/23  Iloabachie, Chioma E, NP  pravastatin (PRAVACHOL) 40 MG tablet Take 1 tablet (40 mg total) by mouth once daily. 11/02/22   Iloabachie, Chioma E, NP  pravastatin (PRAVACHOL) 40 MG tablet Take 1 tablet (40 mg total) by mouth daily. 11/01/22     Rightest GL300 Lancets MISC USE AS DIRECTED TO CHECK BLOOD SUGAR UP TO 4 TIMES DAILY. 02/09/22   Iloabachie, Chioma E, NP  spironolactone (ALDACTONE) 25 MG tablet Take 1 tablet (25 mg total) by mouth once daily. 03/24/21     Vitamin D, Ergocalciferol, (DRISDOL) 1.25 MG (50000 UNIT) CAPS capsule Take 1 capsule (50,000 Units total) by mouth every 7 (seven) days. 08/05/22   Rolm Gala, NP    Inpatient Medications: Scheduled Meds:  budesonide (PULMICORT) nebulizer solution  0.5 mg Nebulization BID   Chlorhexidine Gluconate Cloth  6 each Topical Daily   docusate  100 mg Oral BID   enoxaparin (LOVENOX) injection  50 mg Subcutaneous Q24H   insulin aspart  0-15 Units Subcutaneous Q4H   ipratropium-albuterol  3 mL Nebulization TID   [START ON 03/07/2023] levothyroxine  175 mcg Oral QAC breakfast   methylPREDNISolone (SOLU-MEDROL) injection  40 mg Intravenous Daily   pantoprazole (PROTONIX) IV  40 mg Intravenous QHS   polyethylene glycol  17 g Oral Daily   pravastatin  40 mg Oral q1800   spironolactone  25 mg Oral Daily   Continuous Infusions:  sodium chloride Stopped (03/04/23 1632)   azithromycin Stopped (03/05/23 1736)   aztreonam Stopped (03/06/23 0241)   fentaNYL infusion INTRAVENOUS Stopped (03/05/23 0730)   PRN Meds: docusate sodium, fentaNYL, hydrALAZINE, polyethylene glycol  Allergies:    Allergies  Allergen Reactions   Ceclor [Cefaclor] Hives   Levaquin [Levofloxacin In D5w] Rash    Peel her skin   Levofloxacin Rash   Lisinopril Cough   Ofloxacin Swelling   Penicillins  Anaphylaxis   Ranitidine Hives   Tomato Rash   Zantac [Ranitidine Hcl] Hives    Social History:   Social History   Socioeconomic History   Marital status: Married    Spouse name: Not on file   Number of children: Not on file   Years of education: Not on file   Highest education level: Not on file  Occupational History   Not on file  Tobacco Use   Smoking status: Every Day    Current packs/day: 0.50    Average packs/day: 0.5 packs/day for 20.0 years (10.0 ttl pk-yrs)    Types: Cigarettes   Smokeless tobacco: Never  Vaping Use   Vaping status: Never Used  Substance and Sexual Activity   Alcohol use: Not Currently    Alcohol/week: 0.0 - 1.0 standard drinks of alcohol    Comment: occasionally   Drug use: No   Sexual activity: Not on file  Other Topics Concern   Not on file  Social History Narrative   Is on food stamps but they will be cut in half starting next month. She is not worried about this       Lives in apartment      Pt said she is doing well and Is not currently interested in potential aid    Social Determinants of Health   Financial Resource Strain: Low Risk  (02/01/2018)   Overall Financial Resource Strain (CARDIA)    Difficulty of Paying Living Expenses: Not hard at all  Food Insecurity: No Food Insecurity (03/05/2023)   Hunger Vital Sign    Worried About Running Out of Food in the Last Year: Never true    Ran Out of Food in the Last Year: Never true  Transportation Needs: No Transportation Needs (03/05/2023)   PRAPARE - Administrator, Civil Service (Medical): No    Lack of Transportation (Non-Medical): No  Physical Activity: Inactive (02/01/2018)   Exercise Vital Sign    Days of Exercise per Week: 0 days    Minutes of Exercise per Session: 0 min  Stress: No Stress Concern Present (02/01/2018)   [employer] of Occupational Health - Occupational Stress Questionnaire    Feeling of Stress : Not at all  Social Connections: Moderately  Integrated (02/01/2018)   Social Connection and Isolation Panel [NHANES]    Frequency of Communication with Friends  and Family: More than three times a week    Frequency of Social Gatherings with Friends and Family: Never    Attends Religious Services: More than 4 times per year    Active Member of [employer] or Organizations: No    Attends Banker Meetings: Never    Marital Status: Married  Catering manager Violence: Not At Risk (03/05/2023)   Humiliation, Afraid, Rape, and Kick questionnaire    Fear of Current or Ex-Partner: No    Emotionally Abused: No    Physically Abused: No    Sexually Abused: No    Family History:    Family History  Problem Relation Age of Onset   Cushing syndrome Mother    Thyroid disease Mother    Heart attack Father 13   Hyperlipidemia Brother    Thyroid disease Brother    Hypertension Brother    Breast cancer Neg Hx      ROS:  Please see the history of present illness.  Review of Systems  Constitutional:  Positive for malaise/fatigue.  Respiratory:  Positive for shortness of breath.   Musculoskeletal:  Positive for back pain.  Neurological:  Positive for weakness.    All other ROS reviewed and negative.     Physical Exam/Data:   Vitals:   03/06/23 0800 03/06/23 0900 03/06/23 0904 03/06/23 1000  BP: (!) 147/64 (!) 149/72  (!) 165/74  Pulse: 64 66  71  Resp: (!) 8 (!) 21  18  Temp: (!) 96.9 F (36.1 C) (!) 96.6 F (35.9 C)    TempSrc: Axillary Bladder    SpO2: 94% (!) 88% 93% 95%  Weight:      Height:        Intake/Output Summary (Last 24 hours) at 03/06/2023 1022 Last data filed at 03/06/2023 0800 Gross per 24 hour  Intake 830 ml  Output 4098 ml  Net -3268 ml      03/06/2023    5:00 AM 03/05/2023    4:52 AM 03/04/2023    5:00 AM  Last 3 Weights  Weight (lbs) 199 lb 8.3 oz 200 lb 9.9 oz 214 lb 15.2 oz  Weight (kg) 90.5 kg 91 kg 97.5 kg     Body mass index is 43.16 kg/m.  General:  Well nourished, well developed,  chronically ill-appearing, no in acute distress HEENT: normal Neck: unable to determine JVD due to body habitus Vascular: No carotid bruits; Distal pulses 2+ bilaterally Cardiac:  normal S1, S2; RRR; no murmur  Lungs:  coarse to auscultation bilaterally, respirations are unlabored at rest on  3L of O2 via [place] Abd: soft, nontender, obese,no hepatomegaly  Ext: 1+ edema to BLE Musculoskeletal:  No deformities, BUE and BLE strength normal and equal Skin: warm and dry  Neuro:  CNs 2-12 intact, no focal abnormalities noted Psych:  Normal affect   EKG:  The EKG was personally reviewed and demonstrates: Sinus 60s LVH with early repull and low voltage Telemetry:  Telemetry was personally reviewed and demonstrates: Sinus bradycardia sinus rhythm rates of 50-70  Relevant CV Studies: Limited TTE 03/05/23 1. Left ventricular ejection fraction, by estimation, is 65 to 70%. The  left ventricle has normal function. There is severe concentric left  ventricular hypertrophy. Left ventricular diastolic parameters are  consistent with Grade I diastolic dysfunction  (impaired relaxation).   2. Large pericardial effusion 2.0 cm from LV free wall. Unchanged from  03/04/23. Large pericardial effusion. There is no evidence of cardiac  tamponade.   TTE 03/04/23  1. Left ventricular ejection fraction, by estimation, is 60 to 65%. The  left ventricle has normal function. The left ventricle has no regional  wall motion abnormalities. There is severe left ventricular hypertrophy.  Left ventricular diastolic parameters   are consistent with Grade I diastolic dysfunction (impaired relaxation).   2. Right ventricular systolic function is normal. The right ventricular  size is normal.   3. Left atrial size was mildly dilated.   4. Moderate pericardial effusion estimated 2.0 cm off the LV freee  wall.There is no evidence of cardiac tamponade.   5. The mitral valve is normal in structure. No evidence of mitral valve   regurgitation. No evidence of mitral stenosis.   6. The aortic valve has an indeterminant number of cusps. Aortic valve  regurgitation is not visualized. Aortic valve sclerosis is present, with  no evidence of aortic valve stenosis.   7. The inferior vena cava is dilated in size with >50% respiratory  variability, suggesting right atrial pressure of 8 mmHg.   LHC at outside facility [facility]) 11/2020 CONCLUSIONS:  No significant coronary artery disease  Elevated left ventricular end-diastolic pressure  Hypertension   Laboratory Data:  High Sensitivity Troponin:  No results for input(s): "TROPONINIHS" in the last 720 hours.   Chemistry Recent Labs  Lab 03/03/23 1227 03/04/23 0652 03/05/23 0528 03/06/23 0335  NA 136 135 137 136  K 4.7 4.2 3.9 3.7  CL 86* 86* 86* 83*  CO2 39* 34* 41* 43*  GLUCOSE 100* 118* 84 84  BUN 10 11 17 19   CREATININE 0.81 1.04* 1.15* 1.02*  CALCIUM 8.4* 8.2* 8.1* 8.3*  MG 2.0 1.7 2.3  --   GFRNONAA >60 >60 55* >60  ANIONGAP 11 15 10 10     Recent Labs  Lab 03/04/23 0652 03/05/23 0528 03/06/23 0335  PROT 5.5*  --   --   ALBUMIN 2.7*  2.3* 2.8* 3.1*  AST 34  --   --   ALT 19  --   --   ALKPHOS 46  --   --   BILITOT 0.9  --   --    Lipids No results for input(s): "CHOL", "TRIG", "HDL", "LABVLDL", "LDLCALC", "CHOLHDL" in the last 168 hours.  Hematology Recent Labs  Lab 03/04/23 7253 03/05/23 0528 03/06/23 0335  WBC 8.9 9.9 12.5*  RBC 6.25* 5.95* 5.90*  HGB 16.4* 15.5* 15.2*  HCT 53.2* 50.1* 50.2*  MCV 85.1 84.2 85.1  MCH 26.2 26.1 25.8*  MCHC 30.8 30.9 30.3  RDW 16.7* 17.0* 16.9*  PLT 182 178 160   Thyroid  Recent Labs  Lab 03/04/23 0652  TSH 15.300*    BNP Recent Labs  Lab 03/03/23 1227  BNP 1,751.3*    DDimer No results for input(s): "DDIMER" in the last 168 hours.   Radiology/Studies:  ECHOCARDIOGRAM LIMITED  Result Date: 03/05/2023    ECHOCARDIOGRAM LIMITED REPORT   Patient Name:   NORVELLE KULAGA [facility] Date of Exam:  03/05/2023 Medical Rec #:  [id number]          Height:       57.0 in Accession #:    [id number]         Weight:       200.6 lb Date of Birth:  24-Apr-1966           BSA:          1.801 m Patient Age:    57 years           BP:  153/77 mmHg Patient Gender: F                  HR:           62 bpm. Exam Location:  [facility] Procedure: Limited Echo and Cardiac Doppler Indications:     Pericardial Effusion I31.3  History:         Patient has prior history of Echocardiogram examinations, most                  recent 03/04/2023.  Sonographer:     Overton Mam RDCS, FASE Referring Phys:  [id number] Judithe Modest Diagnosing Phys: Chilton Si MD  Sonographer Comments: Technically difficult study due to poor echo windows, echo performed with patient supine and on artificial respirator and patient is obese. Image acquisition challenging due to patient body habitus. IMPRESSIONS  1. Left ventricular ejection fraction, by estimation, is 65 to 70%. The left ventricle has normal function. There is severe concentric left ventricular hypertrophy. Left ventricular diastolic parameters are consistent with Grade I diastolic dysfunction (impaired relaxation).  2. Large pericardial effusion 2.0 cm from LV free wall. Unchanged from 03/04/23. Large pericardial effusion. There is no evidence of cardiac tamponade. FINDINGS  Left Ventricle: Left ventricular ejection fraction, by estimation, is 65 to 70%. The left ventricle has normal function. There is severe concentric left ventricular hypertrophy. Left ventricular diastolic parameters are consistent with Grade I diastolic  dysfunction (impaired relaxation). Pericardium: Large pericardial effusion 2.0 cm from LV free wall. Unchanged from 03/04/23. A large pericardial effusion is present. There is no evidence of cardiac tamponade. Venous: IVC assessment for right atrial pressure unable to be performed due to mechanical ventilation. LEFT VENTRICLE PLAX 2D LVIDd:         3.60 cm LVIDs:          2.20 cm LV PW:         2.10 cm LV IVS:        1.80 cm  Chilton Si MD Electronically signed by Chilton Si MD Signature Date/Time: 03/05/2023/1:16:31 PM    Final    DG Chest Port 1 View  Result Date: 03/04/2023 CLINICAL DATA:  Short of breath EXAM: PORTABLE CHEST 1 VIEW COMPARISON:  03/04/2023 4 a.m. FINDINGS: Tracheostomy 2 scratch the endotracheal tube and NG tube unchanged. Low lung volumes and enlarged cardiac silhouette. Mild venous congestion. No pneumothorax. No overt edema. IMPRESSION: 1. Low lung volumes and venous congestion. 2. No overt edema. 3. Stable support apparatus. Electronically Signed   By: Genevive Bi M.D.   On: 03/04/2023 15:41   ECHOCARDIOGRAM COMPLETE  Result Date: 03/04/2023    ECHOCARDIOGRAM REPORT   Patient Name:   KAITYLN SHURE [facility] Date of Exam: 03/04/2023 Medical Rec #:  [id number]          Height:       57.0 in Accession #:    [id number]         Weight:       214.9 lb Date of Birth:  1966/05/21           BSA:          1.855 m Patient Age:    57 years           BP:           120/61 mmHg Patient Gender: F                  HR:  67 bpm. Exam Location:  [facility] Procedure: 2D Echo, Cardiac Doppler, Color Doppler and Intracardiac            Opacification Agent Indications:     Elevated Troponin  History:         Patient has no prior history of Echocardiogram examinations.                  Signs/Symptoms:Chest Pain; Risk Factors:Diabetes, Dyslipidemia                  and Current Smoker.  Sonographer:     Mikki Harbor Referring Phys:  [id number] Judithe Modest Diagnosing Phys: Julien Nordmann MD IMPRESSIONS  1. Left ventricular ejection fraction, by estimation, is 60 to 65%. The left ventricle has normal function. The left ventricle has no regional wall motion abnormalities. There is severe left ventricular hypertrophy. Left ventricular diastolic parameters  are consistent with Grade I diastolic dysfunction (impaired relaxation).  2. Right ventricular systolic  function is normal. The right ventricular size is normal.  3. Left atrial size was mildly dilated.  4. Moderate pericardial effusion estimated 2.0 cm off the LV freee wall.There is no evidence of cardiac tamponade.  5. The mitral valve is normal in structure. No evidence of mitral valve regurgitation. No evidence of mitral stenosis.  6. The aortic valve has an indeterminant number of cusps. Aortic valve regurgitation is not visualized. Aortic valve sclerosis is present, with no evidence of aortic valve stenosis.  7. The inferior vena cava is dilated in size with >50% respiratory variability, suggesting right atrial pressure of 8 mmHg. FINDINGS  Left Ventricle: Left ventricular ejection fraction, by estimation, is 60 to 65%. The left ventricle has normal function. The left ventricle has no regional wall motion abnormalities. Definity contrast agent was given IV to delineate the left ventricular  endocardial borders. The left ventricular internal cavity size was normal in size. There is severe left ventricular hypertrophy. Left ventricular diastolic parameters are consistent with Grade I diastolic dysfunction (impaired relaxation). Right Ventricle: The right ventricular size is normal. No increase in right ventricular wall thickness. Right ventricular systolic function is normal. Left Atrium: Left atrial size was mildly dilated. Right Atrium: Right atrial size was normal in size. Pericardium: A moderately sized pericardial effusion is present. There is no evidence of cardiac tamponade. Mitral Valve: The mitral valve is normal in structure. No evidence of mitral valve regurgitation. No evidence of mitral valve stenosis. MV peak gradient, 3.0 mmHg. The mean mitral valve gradient is 1.0 mmHg. Tricuspid Valve: The tricuspid valve is normal in structure. Tricuspid valve regurgitation is not demonstrated. No evidence of tricuspid stenosis. Aortic Valve: The aortic valve has an indeterminant number of cusps. Aortic valve  regurgitation is not visualized. Aortic valve sclerosis is present, with no evidence of aortic valve stenosis. Aortic valve mean gradient measures 6.0 mmHg. Aortic valve peak gradient measures 11.2 mmHg. Aortic valve area, by VTI measures 2.81 cm. Pulmonic Valve: The pulmonic valve was normal in structure. Pulmonic valve regurgitation is not visualized. No evidence of pulmonic stenosis. Aorta: The aortic root is normal in size and structure. Venous: The inferior vena cava is dilated in size with greater than 50% respiratory variability, suggesting right atrial pressure of 8 mmHg. IAS/Shunts: No atrial level shunt detected by color flow Doppler.  LEFT VENTRICLE PLAX 2D LVIDd:         3.90 cm   Diastology LVIDs:         2.10 cm  LV e' medial:    3.92 cm/s LV PW:         2.20 cm   LV E/e' medial:  19.3 LV IVS:        2.00 cm   LV e' lateral:   2.39 cm/s LVOT diam:     2.00 cm   LV E/e' lateral: 31.6 LV SV:         87 LV SV Index:   47 LVOT Area:     3.14 cm  RIGHT VENTRICLE RV Basal diam:  3.80 cm LEFT ATRIUM             Index        RIGHT ATRIUM           Index LA diam:        4.50 cm 2.43 cm/m   RA Area:     18.60 cm LA Vol (A2C):   59.6 ml 32.14 ml/m  RA Volume:   49.50 ml  26.69 ml/m LA Vol (A4C):   55.7 ml 30.03 ml/m LA Biplane Vol: 62.3 ml 33.59 ml/m  AORTIC VALVE                     PULMONIC VALVE AV Area (Vmax):    2.77 cm      PV Vmax:       1.09 m/s AV Area (Vmean):   2.69 cm      PV Peak grad:  4.8 mmHg AV Area (VTI):     2.81 cm AV Vmax:           167.00 cm/s AV Vmean:          109.000 cm/s AV VTI:            0.309 m AV Peak Grad:      11.2 mmHg AV Mean Grad:      6.0 mmHg LVOT Vmax:         147.00 cm/s LVOT Vmean:        93.400 cm/s LVOT VTI:          0.276 m LVOT/AV VTI ratio: 0.89  AORTA Ao Root diam: 3.00 cm MITRAL VALVE MV Area (PHT): 3.30 cm    SHUNTS MV Area VTI:   2.64 cm    Systemic VTI:  0.28 m MV Peak grad:  3.0 mmHg    Systemic Diam: 2.00 cm MV Mean grad:  1.0 mmHg MV Vmax:        0.87 m/s MV Vmean:      53.1 cm/s MV Decel Time: 230 msec MV E velocity: 75.50 cm/s MV A velocity: 84.90 cm/s MV E/A ratio:  0.89 Julien Nordmann MD Electronically signed by Julien Nordmann MD Signature Date/Time: 03/04/2023/1:42:07 PM    Final    DG Chest Port 1 View  Result Date: 03/04/2023 CLINICAL DATA:  Acute respiratory failure hypoxia hypercarbia EXAM: PORTABLE CHEST 1 VIEW COMPARISON:  CXR 03/03/23 FINDINGS: Cardiomegaly. Endotracheal tube terminates 1.5 cm above the carina. Enteric tube courses below diaphragm with the side hole poorly visualized in the tip out of the field of view. No pleural effusion. No pneumothorax. Pulmonary venous congestion without overt pulmonary edema. No radiographically apparent displaced rib fractures. Visualized upper abdomen unremarkable. IMPRESSION: 1. Cardiomegaly with pulmonary venous congestion. No overt pulmonary edema. 2. Endotracheal tube terminates 1.5 cm above the carina. Electronically Signed   By: Lorenza Cambridge M.D.   On: 03/04/2023 07:57   DG Abdomen 1 View  Result Date: 03/03/2023 CLINICAL  DATA:  Orogastric tube placement. EXAM: ABDOMEN - 1 VIEW COMPARISON:  Abdominal radiographs 05/26/2022. Abdominal CT 12/16/2014. FINDINGS: 1448 hours. Enteric tube is in place with the side hole near the gastroesophageal junction. The visualized bowel gas pattern is nonobstructive. Mild atelectasis noted at both lung bases. Cholecystectomy clips and multiple telemetry leads overlying the abdomen are noted. IMPRESSION: Enteric tube side hole is near the gastroesophageal junction. Recommend tube advancement. Electronically Signed   By: Carey Bullocks M.D.   On: 03/03/2023 15:17   DG Chest Portable 1 View  Result Date: 03/03/2023 CLINICAL DATA:  Status post intubation. EXAM: PORTABLE CHEST 1 VIEW COMPARISON:  Chest radiograph 03/03/2023 at 1304 hours. FINDINGS: 1445 hours. Endotracheal tube tip projects over the midthoracic trachea. Enteric tube side port projects over the  stomach. Unchanged cardiomegaly and pulmonary venous congestion. Airspace opacity along the right border may reflect atelectasis. No pleural effusion or pneumothorax. IMPRESSION: 1. Endotracheal tube tip projects over the midthoracic trachea. 2. Unchanged cardiomegaly and pulmonary venous congestion. Electronically Signed   By: Orvan Falconer M.D.   On: 03/03/2023 15:17   DG Chest Port 1 View  Result Date: 03/03/2023 CLINICAL DATA:  Shortness of breath EXAM: PORTABLE CHEST 1 VIEW COMPARISON:  Chest x-ray 02/16/2018 FINDINGS: Enlarged cardiopericardial silhouette with vascular congestion. No pneumothorax or effusion. No edema. Overlapping cardiac leads. IMPRESSION: Enlarged cardiopericardial silhouette with some vascular congestion. Electronically Signed   By: Karen Kays M.D.   On: 03/03/2023 14:01     Assessment and Plan:   Pericardial effusion noted on echocardiogram -Incidental finding on echocardiogram -Patient remains chest pain-free and is maintaining oxygenation on 3 L of O2 via nasal cannula -Has been diuresed with furosemide -Restarting home spironolactone -Limited echocardiogram ordered for tomorrow -Inflammatory markers ordered CRP and ANA -If effusion continues to enlarge or patient is unable to maintain oxygen saturations or becomes unstable effusion will need to be tapped, which can be done here, no immediate need for transfer to [facility]  Acute on chronic HFpEF -Presented with progressive worsening shortness of breath -Failed BiPAP therapy and had to be intubated, successfully extubated 03/05/2023 -BNP 1751.3 - -3 L in the last 24 hours and -6 L since admission -LVEF 65-70% on 02/23/2023 -Restarting spironolactone 25 mg daily -Home beta-blocker remains on hold due to bradycardia on telemetry monitoring -Not ideal candidate for SGLT2 inhibitor in the setting of UTI on admission and sedentary lifestyle -Heart failure education -Daily weights, I's and O's, low-sodium diet  Acute  on chronic hypoxic and hypercapnic respiratory failure -Progressive shortness of breath with BiPAP failure requiring intubation -Tolerating extubation without difficulties -Currently maintaining oxygen saturations on 3 L of O2 via nasal cannula -Continued on daily steroids, antibiotics, and diuretics as needed -Continue with supportive care -Continued management per primary team  Hypertension -Blood pressure 149/72 -Currently on hydroxyzine 5 mg as needed -PTA losartan and atenolol are on hold -Restarting PTA spironolactone -Vital signs per unit protocol  Hypothyroidism -TSH is purposely 58 now at 15 -Continued on home Synthroid  Hyperlipidemia -lipid panel in the am -Restart pravastatin 40 mg daily  Type 2 diabetes -Hemoglobin A1c -Continue with insulin therapy -Management per primary team   Risk Assessment/Risk Scores:        New York Heart Association (NYHA) Functional Class NYHA Class III        For questions or updates, please contact [facility] Please consult [email or web address] for contact info under    Signed, Kinleigh Nault, NP  03/06/2023 10:22 AM

## 2023-03-07 DIAGNOSIS — J9621 Acute and chronic respiratory failure with hypoxia: Secondary | ICD-10-CM | POA: Diagnosis not present

## 2023-03-07 DIAGNOSIS — J9622 Acute and chronic respiratory failure with hypercapnia: Secondary | ICD-10-CM | POA: Diagnosis not present

## 2023-03-07 DIAGNOSIS — I3139 Other pericardial effusion (noninflammatory): Secondary | ICD-10-CM | POA: Diagnosis not present

## 2023-03-07 DIAGNOSIS — I5033 Acute on chronic diastolic (congestive) heart failure: Secondary | ICD-10-CM | POA: Diagnosis not present

## 2023-03-07 LAB — ECHOCARDIOGRAM LIMITED
AR max vel: 1.91 cm2
AV Area VTI: 1.98 cm2
AV Area mean vel: 2.12 cm2
AV Mean grad: 7 mmHg
AV Peak grad: 15.2 mmHg
Ao pk vel: 1.95 m/s
Area-P 1/2: 3.74 cm2
Est EF: >55
Height: 57.008 in
S' Lateral: 2.9 cm
Weight: 3192.26 [oz_av]

## 2023-03-07 LAB — BLOOD GAS, VENOUS
Acid-Base Excess: 22.7 mmol/L — ABNORMAL HIGH (ref 0.0–2.0)
Bicarbonate: 49.3 mmol/L — ABNORMAL HIGH (ref 20.0–28.0)
O2 Saturation: 94.7 %
Patient temperature: 37
pCO2, Ven: 59 mmHg (ref 44–60)
pH, Ven: 7.53 — ABNORMAL HIGH (ref 7.25–7.43)
pO2, Ven: 63 mmHg — ABNORMAL HIGH (ref 32–45)

## 2023-03-07 LAB — RENAL FUNCTION PANEL
Albumin: 3 g/dL — ABNORMAL LOW (ref 3.5–5.0)
Anion gap: 12 (ref 5–15)
BUN: 21 mg/dL — ABNORMAL HIGH (ref 6–20)
CO2: 39 mmol/L — ABNORMAL HIGH (ref 22–32)
Calcium: 8.5 mg/dL — ABNORMAL LOW (ref 8.9–10.3)
Chloride: 85 mmol/L — ABNORMAL LOW (ref 98–111)
Creatinine, Ser: 1.07 mg/dL — ABNORMAL HIGH (ref 0.44–1.00)
GFR, Estimated: 60 mL/min — ABNORMAL LOW (ref >60)
Glucose, Bld: 97 mg/dL (ref 70–99)
Phosphorus: 2.9 mg/dL (ref 2.5–4.6)
Potassium: 3.4 mmol/L — ABNORMAL LOW (ref 3.5–5.1)
Sodium: 136 mmol/L (ref 135–145)

## 2023-03-07 LAB — CBC
HCT: 52.2 % — ABNORMAL HIGH (ref 36.0–46.0)
Hemoglobin: 15.7 g/dL — ABNORMAL HIGH (ref 12.0–15.0)
MCH: 25.9 pg — ABNORMAL LOW (ref 26.0–34.0)
MCHC: 30.1 g/dL (ref 30.0–36.0)
MCV: 86.1 fL (ref 80.0–100.0)
Platelets: 164 10*3/uL (ref 150–400)
RBC: 6.06 MIL/uL — ABNORMAL HIGH (ref 3.87–5.11)
RDW: 16.8 % — ABNORMAL HIGH (ref 11.5–15.5)
WBC: 8.7 10*3/uL (ref 4.0–10.5)
nRBC: 0 % (ref 0.0–0.2)

## 2023-03-07 LAB — GLUCOSE, CAPILLARY
Glucose-Capillary: 106 mg/dL — ABNORMAL HIGH (ref 70–99)
Glucose-Capillary: 184 mg/dL — ABNORMAL HIGH (ref 70–99)
Glucose-Capillary: 160 mg/dL — ABNORMAL HIGH (ref 70–99)
Glucose-Capillary: 243 mg/dL — ABNORMAL HIGH (ref 70–99)

## 2023-03-07 LAB — T4, FREE: Free T4: 0.61 ng/dL (ref 0.61–1.12)

## 2023-03-07 MED ORDER — ADULT MULTIVITAMIN W/MINERALS CH
1.0000 | ORAL_TABLET | Freq: Every day | ORAL | Status: DC
  Administered 2023-03-07 – 2023-03-08 (×2): 1 via ORAL
  Filled 2023-03-07 (×2): qty 1

## 2023-03-07 MED ORDER — POTASSIUM CHLORIDE CRYS ER 20 MEQ PO TBCR
20.0000 meq | EXTENDED_RELEASE_TABLET | Freq: Once | ORAL | Status: AC
  Administered 2023-03-07: 20 meq via ORAL
  Filled 2023-03-07: qty 1

## 2023-03-07 MED ORDER — LOSARTAN POTASSIUM 50 MG PO TABS
100.0000 mg | ORAL_TABLET | Freq: Every day | ORAL | Status: DC
  Administered 2023-03-07 – 2023-03-08 (×2): 100 mg via ORAL
  Filled 2023-03-07 (×2): qty 2

## 2023-03-07 NOTE — Plan of Care (Signed)

## 2023-03-07 NOTE — Progress Notes (Signed)
Nutrition Follow-up  DOCUMENTATION CODES:   Morbid obesity  INTERVENTION:   -MVI with minerals daily  -Continue to encourage adequate PO intake   NUTRITION DIAGNOSIS:   Inadequate oral intake related to inability to eat (pt sedated and ventilated) as evidenced by NPO status.  Ongoing  GOAL:   Patient will meet greater than or equal to 90% of their needs  Progressing; advanced to PO diet on 03/05/23  MONITOR:   PO intake  REASON FOR ASSESSMENT:   Ventilator    ASSESSMENT:   57 y/o female with h/o CHF, COPD, HTN, hypothyroidism, OSA, HLD, DM and asthma who is admitted with COPD and CHF exacerbation.  8/31- extubated, advanced to dysphagia 3 diet  Reviewed I/O's: -4 L x 24 hours and -9.7 L since admission  UOP: 5.2 L x 24 hours  Pt receiving personal care at time of visit.   Pt with good appetite. Noted meal completions 80-100%.   Noted progressive wt loss, likely related to diuresis. Pt -9.7 L since admission.   Medications reviewed and include colace, lasix, solu-medrol, miralax, and aldactone.   Labs reviewed: CBGS: 106-213 (inpatient orders for glycemic control are 0-5 units insulin aspart daily at bedtime and 0-9 units insulin aspart TID with meals).     Diet Order:   Diet Order             DIET DYS 3 Room service appropriate? Yes; Fluid consistency: Thin  Diet effective now                   EDUCATION NEEDS:   No education needs have been identified at this time  Skin:  Skin Assessment: Reviewed RN Assessment (ecchymosis)  Last BM:  03/04/23  Height:   Ht Readings from Last 1 Encounters:  03/04/23 4' 9.01" (1.448 m)    Weight:   Wt Readings from Last 1 Encounters:  03/07/23 89 kg    Ideal Body Weight:  43 kg  BMI:  Body mass index is 42.45 kg/m.  Estimated Nutritional Needs:   Kcal:  1500-1700  Protein:  75-90 grams  Fluid:  1.5-1.7 L    Levada Schilling, RD, LDN, CDCES Registered Dietitian II Certified Diabetes Care and  Education Specialist Please refer to [facility] for RD and/or RD on-call/weekend/after hours pager

## 2023-03-07 NOTE — Progress Notes (Signed)
   Patient Name: Lindsey Benjamin Date of Encounter: 03/07/2023 [facility] Cardiologist: [facility]  Interval Summary  .    Breathing a bit better today, though still coughing some with intermittent sputum production.  No chest pain, palpitations, or lightheadedness.  Vital Signs .    Vitals:   03/07/23 0600 03/07/23 0700 03/07/23 0731 03/07/23 0800  BP: (!) 154/74 (!) 170/81  (!) 162/76  Pulse: 74 72 73 71  Resp: (!) 22 17 20 18   Temp:    98.8 F (37.1 C)  TempSrc:    Oral  SpO2: 93% 97% 100% 96%  Weight:      Height:        Intake/Output Summary (Last 24 hours) at 03/07/2023 1013 Last data filed at 03/07/2023 0819 Gross per 24 hour  Intake 1308 ml  Output 4850 ml  Net -3542 ml      03/07/2023    5:00 AM 03/06/2023    5:00 AM 03/05/2023    4:52 AM  Last 3 Weights  Weight (lbs) 196 lb 3.4 oz 199 lb 8.3 oz 200 lb 9.9 oz  Weight (kg) 89 kg 90.5 kg 91 kg      Telemetry/ECG    Normal sinus rhythm, PACs, and PVCs. - Personally Reviewed  Physical Exam .   GEN: No acute distress.   Neck: No JVD Cardiac: RRR, no murmurs, rubs, or gallops.  Respiratory: Coarse breath sounds bilaterally, mildly diminished at the lung bases. GI: Soft, nontender, non-distended  MS: No edema  Assessment & Plan .     Pericardial effusion: Previously thought to be moderate to large, though we will repeat limited echo yesterday more consistent with small to moderate effusion (image quality suboptimal).  No evidence of tamponade physiology.  Clinically, the patient continues to improve.  Suspect untreated hypothyroidism may be driving pericardial effusion.  However, CRP elevated this admission indicating some underlying inflammatory process may be contributing. -No indication for pericardiocentesis. -Continue corticosteroids per primary team, currently being used for COPD exacerbation as well. -Add colchicine 0.6 mg twice daily.  Acute on chronic HFpEF: Patient appears grossly euvolemic on  exam, though assessment is challenging due to body habitus and positioning. -Continue current doses of furosemide and spironolactone. -Consider deescalation of diuretic regimen when BUN/creatinine begin to rise, suggesting early intravascular volume depletion.  Essential hypertension: Blood pressure remains poorly controlled.  Losartan was added yesterday. -Increase losartan to 100 mg daily. -Continue current doses of furosemide and spironolactone.  COPD exacerbation: Oxygen requirement improved and well below baseline. -Continue antimicrobial therapy, steroids, bronchodilators, and supplemental oxygen per primary team.  For questions or updates, please contact [facility] Please consult [email or web address] for contact info under [facility].    Signed, Yvonne Kendall, MD

## 2023-03-07 NOTE — TOC Progression Note (Signed)
Transition of Care [facility]) - Progression Note    Patient Details  Name: Lindsey Benjamin MRN: [id number] Date of Birth: 15-Feb-1966  Transition of Care [facility]) CM/SW Contact  Truddie Hidden, RN Phone Number: 03/07/2023, 4:25 PM  Clinical Narrative:    Spoke with patient about therapy's recommendation for [facility] PT. She is agreeable. She was advised referral would be sent to [facility]. She was also advised if accepted they would contact her to schedule start of care.         Expected Discharge Plan and Services                                               Social Determinants of Health (SDOH) Interventions SDOH Screenings   Food Insecurity: No Food Insecurity (03/05/2023)  Housing: Low Risk  (03/05/2023)  Transportation Needs: No Transportation Needs (03/05/2023)  Utilities: Not At Risk (03/05/2023)  Alcohol Screen: Low Risk  (11/11/2021)  Depression (PHQ2-9): Low Risk  (11/11/2021)  Financial Resource Strain: Low Risk  (02/01/2018)  Physical Activity: Inactive (02/01/2018)  Social Connections: Moderately Integrated (02/01/2018)  Stress: No Stress Concern Present (02/01/2018)  Tobacco Use: High Risk (03/03/2023)  Health Literacy: Low Risk  (10/09/2020)   Received from [facility], [facility] Care    Readmission Risk Interventions     No data to display

## 2023-03-07 NOTE — Progress Notes (Signed)
PROGRESS NOTE  Lindsey Benjamin:[id number] DOB: 08/10/1965 DOA: 03/03/2023 PCP: [name], MD  HPI/Recap of past 24 hours: 57 y.o. 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 Extubated on 03/05/23.  TR[facility] assumed care on 03/06/23.  Hospital course complicated by pericardial effusion incidentally seen on 2D echo done on 03/05/2023, subsequently, cardiology was consulted.  No evidence of tamponade.  CRP 3.2 elevated.  TSH also elevated >15.  Pericardial effusion may be secondary to uncontrolled hypothyroidism versus others.  Cardiology recommended to continue steroids, and added colchicine.  03/07/2023: The patient was seen and examined at her bedside.  She is currently on 2 L with O2 saturation of 97%.  She states that her baseline she requires 6 L of oxygen supplementation.  She has no new complaints at this time.  Cardiac medications are being titrated to improve her uncontrolled hypertension.  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 Continue Aztreonam and azithromycin for CAP/AECOPD -Continue steroids daily and GI prophylaxis daily PPI.   #Acute Decompensated HFpEF #Hypertension # Pericardial  effusion seen on 2D echo with no evidence of cardiac tamponade Followed by cardiology, appreciate assistance. Continue losartan, IV Lasix, and Spironolactone per cardiology CRP elevated 3.2. Currently no indication for pericardiocentesis per cardiology. Pericardial effusion may have been contributed by uncontrolled hypothyroidism Repeat 2D echo done on 03/06/2023 by cardiology showed small to moderate pericardial effusion. Per cardiology continue steroids, colchicine added.   #Diabetes Mellitus Type II with hyperglycemia, exacerbated by steroids. #Hypothyroidism, uncontrolled due to noncompliance. -Continue insulin coverage Target range of 140 to 180 TSH 15.3, free T4, 0.61. Continue levothyroxine.   # Resolved acute Metabolic Encephalopathy in setting of CO2 Narcosis Extubated on 03/05/2023 Mentation is back to baseline. pCO2 is improved 59 from 77, 3 days ago with most current pH of 7.53. Continue BiPAP nightly  OSA on BiPAP nightly Continue   Physical debility Continue PT OT Fall precautions  Morbid obesity BMI 42 Recommend weight loss outpatient with regular physical activity and healthy dieting.   Time: 50 minutes.   Barriers to discharge: Ongoing medical management of pericardial effusion by cardiology.   Code Status: Full code  Family Communication: None at bedside  Disposition Plan: Likely will discharge to home once cardiology signs off within the next 24 to 48 hours.   Consultants: Cardiology PCCM  Procedures: Intubation Extubation 2D echo  Antimicrobials: Aztreonam and azithromycin  DVT prophylaxis: Subcu Lovenox daily  Status is: Inpatient The patient requires at least 2 midnights for further evaluation and treatment of present condition.    Objective: Vitals:   03/07/23 0731 03/07/23 0800 03/07/23 0900 03/07/23 1000  BP:  (!) 162/76 (!) 159/80 132/66  Pulse: 73 71 71 75  Resp: 20 18 18  (!) 21  Temp:  98.8 F (37.1 C)    TempSrc:   Oral    SpO2:  100% 96% (!) 86% 93%  Weight:      Height:        Intake/Output Summary (Last 24 hours) at 03/07/2023 1108 Last data filed at 03/07/2023 0819 Gross per 24 hour  Intake 1308 ml  Output 4850 ml  Net -3542 ml   Filed Weights   03/05/23 0452 03/06/23 0500 03/07/23 0500  Weight: 91 kg 90.5 kg 89 kg    Exam:  General: 57 y.o. year-old female well developed well nourished in no acute distress.  Alert and oriented x 3. Cardiovascular: Regular rate and rhythm noted with no rubs or gallops.  No thyromegaly or JVD noted.   Respiratory: Faint rales at bases.  Good inspiratory effort. Abdomen: Soft nontender nondistended with normal bowel sounds x4 quadrants. Musculoskeletal: No lower extremity edema bilaterally. Skin: No ulcerative lesions noted or rashes, Psychiatry: Mood is appropriate for condition and setting.   Data Reviewed: CBC: Recent Labs  Lab 03/03/23 1227 03/04/23 0652 03/05/23 0528 03/06/23 0335 03/07/23 0321  WBC 6.3 8.9 9.9 12.5* 8.7  NEUTROABS 4.2  --   --   --   --   HGB 16.4* 16.4* 15.5* 15.2* 15.7*  HCT 56.3* 53.2* 50.1* 50.2* 52.2*  MCV 89.9 85.1 84.2 85.1 86.1  PLT 174 182 178 160 164   Basic Metabolic Panel: Recent Labs  Lab 03/03/23 1227 03/04/23 0652 03/05/23 0528 03/06/23 0335 03/07/23 0321  NA 136 135 137 136 136  K 4.7 4.2 3.9 3.7 3.4*  CL 86* 86* 86* 83* 85*  CO2 39* 34* 41* 43* 39*  GLUCOSE 100* 118* 84 84 97  BUN 10 11 17 19  21*  CREATININE 0.81 1.04* 1.15* 1.02* 1.07*  CALCIUM 8.4* 8.2* 8.1* 8.3* 8.5*  MG 2.0 1.7 2.3  --   --   PHOS  --  4.5 5.3* 4.7* 2.9   GFR: Estimated Creatinine Clearance: 52.5 mL/min (A) (by C-G formula based on SCr of 1.07 mg/dL (H)). Liver Function Tests: Recent Labs  Lab 03/04/23 1610 03/05/23 0528 03/06/23 0335 03/07/23 0321  AST 34  --   --   --   ALT 19  --   --   --   ALKPHOS 46  --   --   --   BILITOT 0.9  --   --   --   PROT 5.5*  --   --   --   ALBUMIN 2.7*  2.3* 2.8* 3.1* 3.0*    No results for input(s): "LIPASE", "AMYLASE" in the last 168 hours. No results for input(s): "AMMONIA" in the last 168 hours. Coagulation Profile: No results for input(s): "INR", "PROTIME" in the last 168 hours. Cardiac Enzymes: No results for input(s): "CKTOTAL", "CKMB", "CKMBINDEX", "TROPONINI" in the last 168 hours. BNP (last 3 results) No results for input(s): "PROBNP" in the last 8760 hours. HbA1C: No results for input(s): "HGBA1C" in the last 72 hours.  CBG: Recent Labs  Lab 03/06/23 0757 03/06/23 1123 03/06/23 1628 03/06/23 1940 03/07/23 0720  GLUCAP 95 153* 170* 213* 106*   Lipid Profile: No results for input(s): "CHOL", "HDL", "LDLCALC", "TRIG", "CHOLHDL", "LDLDIRECT" in the last 72 hours. Thyroid Function Tests: Recent Labs    03/07/23 0321  FREET4 0.61   Anemia Panel: No results for input(s): "VITAMINB12", "FOLATE", "FERRITIN", "TIBC", "IRON", "RETICCTPCT" in the last 72 hours. Urine analysis:    Component Value Date/Time   COLORURINE YELLOW (A) 03/03/2023 1534   APPEARANCEUR HAZY (A) 03/03/2023 1534   APPEARANCEUR Cloudy (A) 11/11/2021 1244  LABSPEC 1.010 03/03/2023 1534   PHURINE 8.0 03/03/2023 1534   GLUCOSEU NEGATIVE 03/03/2023 1534   HGBUR NEGATIVE 03/03/2023 1534   BILIRUBINUR NEGATIVE 03/03/2023 1534   BILIRUBINUR Negative 11/11/2021 1244   KETONESUR NEGATIVE 03/03/2023 1534   PROTEINUR >=300 (A) 03/03/2023 1534   NITRITE POSITIVE (A) 03/03/2023 1534   LEUKOCYTESUR NEGATIVE 03/03/2023 1534   Sepsis Labs: @LABRCNTIP (procalcitonin:4,lacticidven:4)  ) Recent Results (from the past 240 hour(s))  Resp panel by RT-PCR (RSV, Flu A&B, Covid) Anterior Nasal Swab     Status: None   Collection Time: 03/03/23 12:27 PM   Specimen: Anterior Nasal Swab  Result Value Ref Range Status   SARS Coronavirus 2 by RT PCR NEGATIVE NEGATIVE Final    Comment: (NOTE) SARS-CoV-2 target nucleic acids are NOT DETECTED.  The SARS-CoV-2 RNA is generally detectable  in upper respiratory specimens during the acute phase of infection. The lowest concentration of SARS-CoV-2 viral copies this assay can detect is 138 copies/mL. A negative result does not preclude SARS-Cov-2 infection and should not be used as the sole basis for treatment or other patient management decisions. A negative result may occur with  improper specimen collection/handling, submission of specimen other than nasopharyngeal swab, presence of viral mutation(s) within the areas targeted by this assay, and inadequate number of viral copies(<138 copies/mL). A negative result must be combined with clinical observations, patient history, and epidemiological information. The expected result is Negative.  Fact Sheet for Patients:  BloggerCourse.com  Fact Sheet for Healthcare Providers:  SeriousBroker.it  This test is no t yet approved or cleared by the [place] FDA and  has been authorized for detection and/or diagnosis of SARS-CoV-2 by FDA under an Emergency Use Authorization (EUA). This EUA will remain  in effect (meaning this test can be used) for the duration of the COVID-19 declaration under Section 564(b)(1) of the Act, 21 U.S.C.section 360bbb-3(b)(1), unless the authorization is terminated  or revoked sooner.       Influenza A by PCR NEGATIVE NEGATIVE Final   Influenza B by PCR NEGATIVE NEGATIVE Final    Comment: (NOTE) The Xpert Xpress SARS-CoV-2/FLU/RSV plus assay is intended as an aid in the diagnosis of influenza from Nasopharyngeal swab specimens and should not be used as a sole basis for treatment. Nasal washings and aspirates are unacceptable for Xpert Xpress SARS-CoV-2/FLU/RSV testing.  Fact Sheet for Patients: BloggerCourse.com  Fact Sheet for Healthcare Providers: SeriousBroker.it  This test is not yet approved or cleared by the [place] FDA and has  been authorized for detection and/or diagnosis of SARS-CoV-2 by FDA under an Emergency Use Authorization (EUA). This EUA will remain in effect (meaning this test can be used) for the duration of the COVID-19 declaration under Section 564(b)(1) of the Act, 21 U.S.C. section 360bbb-3(b)(1), unless the authorization is terminated or revoked.     Resp Syncytial Virus by PCR NEGATIVE NEGATIVE Final    Comment: (NOTE) Fact Sheet for Patients: BloggerCourse.com  Fact Sheet for Healthcare Providers: SeriousBroker.it  This test is not yet approved or cleared by the [place] FDA and has been authorized for detection and/or diagnosis of SARS-CoV-2 by FDA under an Emergency Use Authorization (EUA). This EUA will remain in effect (meaning this test can be used) for the duration of the COVID-19 declaration under Section 564(b)(1) of the Act, 21 U.S.C. section 360bbb-3(b)(1), unless the authorization is terminated or revoked.  Performed at [facility], [address], [place], [place] [address]   Culture, Respiratory w Gram Stain  Status: None   Collection Time: 03/03/23  3:47 PM   Specimen: SPU; Respiratory  Result Value Ref Range Status   Specimen Description   Final    SPUTUM Performed at [facility], [address] [place], [place] [address]    Special Requests   Final    TASP Performed at [facility], [address] [place], [place] [address]    Gram Stain   Final    FEW WBC PRESENT, PREDOMINANTLY MONONUCLEAR FEW GRAM POSITIVE COCCI IN PAIRS    Culture   Final    Normal respiratory flora-no Staph aureus or Pseudomonas seen Performed at [facility], [address], [place], [place] [address]    Report Status 03/06/2023 FINAL  Final  MRSA Next Gen by PCR, Nasal     Status: None   Collection Time: 03/03/23  4:28 PM   Specimen: Nasal Mucosa; Nasal Swab  Result Value Ref Range Status    MRSA by PCR Next Gen NOT DETECTED NOT DETECTED Final    Comment: (NOTE) The GeneXpert MRSA Assay (FDA approved for NASAL specimens only), is one component of a comprehensive MRSA colonization surveillance program. It is not intended to diagnose MRSA infection nor to guide or monitor treatment for MRSA infections. Test performance is not FDA approved in patients less than 32 years old. Performed at [facility], [address] [place], [place] [address]   Respiratory (~20 pathogens) panel by PCR     Status: None   Collection Time: 03/03/23 11:06 PM   Specimen: Nasopharyngeal Swab; Respiratory  Result Value Ref Range Status   Adenovirus NOT DETECTED NOT DETECTED Final   Coronavirus 229E NOT DETECTED NOT DETECTED Final    Comment: (NOTE) The Coronavirus on the Respiratory Panel, DOES NOT test for the novel  Coronavirus (2019 nCoV)    Coronavirus HKU1 NOT DETECTED NOT DETECTED Final   Coronavirus NL63 NOT DETECTED NOT DETECTED Final   Coronavirus OC43 NOT DETECTED NOT DETECTED Final   Metapneumovirus NOT DETECTED NOT DETECTED Final   Rhinovirus / Enterovirus NOT DETECTED NOT DETECTED Final   Influenza A NOT DETECTED NOT DETECTED Final   Influenza B NOT DETECTED NOT DETECTED Final   Parainfluenza Virus 1 NOT DETECTED NOT DETECTED Final   Parainfluenza Virus 2 NOT DETECTED NOT DETECTED Final   Parainfluenza Virus 3 NOT DETECTED NOT DETECTED Final   Parainfluenza Virus 4 NOT DETECTED NOT DETECTED Final   Respiratory Syncytial Virus NOT DETECTED NOT DETECTED Final   Bordetella pertussis NOT DETECTED NOT DETECTED Final   Bordetella Parapertussis NOT DETECTED NOT DETECTED Final   Chlamydophila pneumoniae NOT DETECTED NOT DETECTED Final   Mycoplasma pneumoniae NOT DETECTED NOT DETECTED Final    Comment: Performed at [facility], [address], [place], [place] [address]      Studies: ECHOCARDIOGRAM LIMITED  Result Date: 03/07/2023    ECHOCARDIOGRAM LIMITED REPORT    Patient Name:   PARICIA SHULTIS [facility] Date of Exam: 03/06/2023 Medical Rec #:  [id number]          Height:       57.0 in Accession #:    [id number]         Weight:       199.5 lb Date of Birth:  1966-01-13           BSA:          1.797 m Patient Age:    89 years  BP:           165/74 mmHg Patient Gender: F                  HR:           71 bpm. Exam Location:  [facility] Procedure: Limited Echo, Cardiac Doppler and Color Doppler Indications:     Pericardial effusion I31.3  History:         Patient has no prior history of Echocardiogram examinations.                  Risk Factors:Hypertension, Diabetes and Current Smoker.  Sonographer:     Neysa Bonito Roar Referring Phys:  [id number] SHERI HAMMOCK Diagnosing Phys: Yvonne Kendall MD  Sonographer Comments: Technically difficult study due to poor echo windows. IMPRESSIONS  1. Left ventricular ejection fraction, by estimation, is >55%. The left ventricle has normal function. Left ventricular endocardial border not optimally defined to evaluate regional wall motion. There is moderate left ventricular hypertrophy.  2. Right ventricular systolic function is normal. The right ventricular size is normal.  3. There is a small to moderate pericardial effusion. There is no evidence of cardiac tamponade. Comparison(s): A prior study was performed on 03/05/2023. Pericardial effusion appears smaller, though quality of this study is notably worse compared to prior exam. FINDINGS  Left Ventricle: Left ventricular ejection fraction, by estimation, is >55%. The left ventricle has normal function. Left ventricular endocardial border not optimally defined to evaluate regional wall motion. The left ventricular internal cavity size was  normal in size. There is moderate left ventricular hypertrophy. Right Ventricle: The right ventricular size is normal. Right ventricular systolic function is normal. Pericardium: There is a small to moderate pericardial effusion. There is no evidence of cardiac  tamponade. Aortic Valve: Aortic valve mean gradient measures 7.0 mmHg. Aortic valve peak gradient measures 15.2 mmHg. Aortic valve area, by VTI measures 1.98 cm. LEFT VENTRICLE PLAX 2D LVIDd:         4.40 cm LVIDs:         2.90 cm LV PW:         1.30 cm LV IVS:        1.50 cm LVOT diam:     1.70 cm LV SV:         63 LV SV Index:   35 LVOT Area:     2.27 cm  RIGHT VENTRICLE RV Basal diam:  3.00 cm RV Mid diam:    2.70 cm AORTIC VALVE                     PULMONIC VALVE AV Area (Vmax):    1.91 cm      PV Vmax:          1.17 m/s AV Area (Vmean):   2.12 cm      PV Peak grad:     5.5 mmHg AV Area (VTI):     1.98 cm      PR End Diast Vel: 5.76 msec AV Vmax:           195.00 cm/s   RVOT Peak grad:   4 mmHg AV Vmean:          122.000 cm/s AV VTI:            0.320 m AV Peak Grad:      15.2 mmHg AV Mean Grad:      7.0 mmHg LVOT Vmax:  164.00 cm/s LVOT Vmean:        114.000 cm/s LVOT VTI:          0.279 m LVOT/AV VTI ratio: 0.87  AORTA Ao Asc diam: 3.10 cm MITRAL VALVE MV Area (PHT): 3.74 cm    SHUNTS MV Decel Time: 203 msec    Systemic VTI:  0.28 m MV E velocity: 70.70 cm/s  Systemic Diam: 1.70 cm MV A velocity: 98.00 cm/s MV E/A ratio:  0.72 Christopher End MD Electronically signed by Yvonne Kendall MD Signature Date/Time: 03/07/2023/8:02:43 AM    Final     Scheduled Meds:  budesonide (PULMICORT) nebulizer solution  0.5 mg Nebulization BID   Chlorhexidine Gluconate Cloth  6 each Topical Daily   docusate  100 mg Oral BID   enoxaparin (LOVENOX) injection  50 mg Subcutaneous Q24H   furosemide  40 mg Intravenous Daily   insulin aspart  0-5 Units Subcutaneous QHS   insulin aspart  0-9 Units Subcutaneous TID WC   ipratropium-albuterol  3 mL Nebulization BID   levothyroxine  175 mcg Oral QAC breakfast   losartan  100 mg Oral Daily   methylPREDNISolone (SOLU-MEDROL) injection  40 mg Intravenous Daily   multivitamin with minerals  1 tablet Oral Daily   pantoprazole (PROTONIX) IV  40 mg Intravenous QHS    polyethylene glycol  17 g Oral Daily   pravastatin  40 mg Oral q1800   spironolactone  25 mg Oral Daily    Continuous Infusions:  sodium chloride Stopped (03/04/23 1632)   azithromycin 250 mg (03/07/23 0942)   aztreonam 2 g (03/07/23 1103)     LOS: 4 days     Darlin Drop, MD [facility] Pager [phone]  If 7PM-7AM, please contact night-coverage [email or web address] Password [email or web address] 03/07/2023, 11:08 AM

## 2023-03-08 ENCOUNTER — Other Ambulatory Visit: Payer: Self-pay

## 2023-03-08 DIAGNOSIS — J9621 Acute and chronic respiratory failure with hypoxia: Secondary | ICD-10-CM | POA: Diagnosis not present

## 2023-03-08 DIAGNOSIS — I3139 Other pericardial effusion (noninflammatory): Secondary | ICD-10-CM | POA: Diagnosis not present

## 2023-03-08 DIAGNOSIS — J9622 Acute and chronic respiratory failure with hypercapnia: Secondary | ICD-10-CM | POA: Diagnosis not present

## 2023-03-08 LAB — RENAL FUNCTION PANEL
Albumin: 2.9 g/dL — ABNORMAL LOW (ref 3.5–5.0)
Anion gap: 13 (ref 5–15)
BUN: 27 mg/dL — ABNORMAL HIGH (ref 6–20)
CO2: 37 mmol/L — ABNORMAL HIGH (ref 22–32)
Calcium: 8.6 mg/dL — ABNORMAL LOW (ref 8.9–10.3)
Chloride: 87 mmol/L — ABNORMAL LOW (ref 98–111)
Creatinine, Ser: 0.97 mg/dL (ref 0.44–1.00)
GFR, Estimated: >60 mL/min (ref >60)
Glucose, Bld: 100 mg/dL — ABNORMAL HIGH (ref 70–99)
Phosphorus: 3.7 mg/dL (ref 2.5–4.6)
Potassium: 3.4 mmol/L — ABNORMAL LOW (ref 3.5–5.1)
Sodium: 137 mmol/L (ref 135–145)

## 2023-03-08 LAB — GLUCOSE, CAPILLARY
Glucose-Capillary: 172 mg/dL — ABNORMAL HIGH (ref 70–99)
Glucose-Capillary: 175 mg/dL — ABNORMAL HIGH (ref 70–99)
Glucose-Capillary: 90 mg/dL (ref 70–99)

## 2023-03-08 LAB — CBC
HCT: 52.7 % — ABNORMAL HIGH (ref 36.0–46.0)
Hemoglobin: 16.2 g/dL — ABNORMAL HIGH (ref 12.0–15.0)
MCH: 25.9 pg — ABNORMAL LOW (ref 26.0–34.0)
MCHC: 30.7 g/dL (ref 30.0–36.0)
MCV: 84.3 fL (ref 80.0–100.0)
Platelets: 173 10*3/uL (ref 150–400)
RBC: 6.25 MIL/uL — ABNORMAL HIGH (ref 3.87–5.11)
RDW: 18.1 % — ABNORMAL HIGH (ref 11.5–15.5)
WBC: 8.9 10*3/uL (ref 4.0–10.5)
nRBC: 0 % (ref 0.0–0.2)

## 2023-03-08 LAB — MAGNESIUM: Magnesium: 2.1 mg/dL (ref 1.7–2.4)

## 2023-03-08 LAB — ANA W/REFLEX IF POSITIVE: Anti Nuclear Antibody (ANA): NEGATIVE

## 2023-03-08 MED ORDER — LOSARTAN POTASSIUM 100 MG PO TABS
100.0000 mg | ORAL_TABLET | Freq: Every day | ORAL | 0 refills | Status: DC
  Filled 2023-03-08: qty 90, 90d supply, fill #0

## 2023-03-08 MED ORDER — PRAVASTATIN SODIUM 40 MG PO TABS
40.0000 mg | ORAL_TABLET | Freq: Every day | ORAL | 0 refills | Status: DC
  Filled 2023-03-08: qty 90, 90d supply, fill #0

## 2023-03-08 MED ORDER — FUROSEMIDE 20 MG PO TABS
20.0000 mg | ORAL_TABLET | Freq: Every day | ORAL | 0 refills | Status: DC
  Filled 2023-03-08: qty 30, 30d supply, fill #0

## 2023-03-08 MED ORDER — FLUTICASONE-SALMETEROL 250-50 MCG/ACT IN AEPB
1.0000 | INHALATION_SPRAY | Freq: Two times a day (BID) | RESPIRATORY_TRACT | 0 refills | Status: DC
  Filled 2023-03-08: qty 60, 30d supply, fill #0

## 2023-03-08 MED ORDER — SPIRONOLACTONE 25 MG PO TABS
25.0000 mg | ORAL_TABLET | Freq: Every day | ORAL | 0 refills | Status: DC
  Filled 2023-03-08: qty 30, 30d supply, fill #0

## 2023-03-08 MED ORDER — POTASSIUM CHLORIDE CRYS ER 20 MEQ PO TBCR
40.0000 meq | EXTENDED_RELEASE_TABLET | Freq: Once | ORAL | Status: AC
  Administered 2023-03-08: 40 meq via ORAL
  Filled 2023-03-08: qty 2

## 2023-03-08 MED ORDER — PANTOPRAZOLE SODIUM 40 MG PO TBEC
40.0000 mg | DELAYED_RELEASE_TABLET | Freq: Every day | ORAL | 0 refills | Status: DC
  Filled 2023-03-08: qty 30, 30d supply, fill #0

## 2023-03-08 MED ORDER — AMLODIPINE BESYLATE 5 MG PO TABS
2.5000 mg | ORAL_TABLET | Freq: Every day | ORAL | Status: DC
  Administered 2023-03-08: 2.5 mg via ORAL
  Filled 2023-03-08: qty 1

## 2023-03-08 MED ORDER — FUROSEMIDE 20 MG PO TABS: 20.0000 mg | ORAL_TABLET | Freq: Every day | ORAL | Status: DC

## 2023-03-08 MED ORDER — LEVOTHYROXINE SODIUM 175 MCG PO TABS
175.0000 ug | ORAL_TABLET | Freq: Every day | ORAL | 0 refills | Status: DC
  Filled 2023-03-08: qty 90, 90d supply, fill #0

## 2023-03-08 MED ORDER — BUDESONIDE-FORMOTEROL FUMARATE 80-4.5 MCG/ACT IN AERO
2.0000 | INHALATION_SPRAY | Freq: Two times a day (BID) | RESPIRATORY_TRACT | 0 refills | Status: DC
  Filled 2023-03-08: qty 1, fill #0

## 2023-03-08 MED ORDER — BUDESONIDE 0.5 MG/2ML IN SUSP
0.5000 mg | Freq: Two times a day (BID) | RESPIRATORY_TRACT | 0 refills | Status: DC
  Filled 2023-03-08: qty 60, 15d supply, fill #0

## 2023-03-08 MED ORDER — ADULT MULTIVITAMIN W/MINERALS CH
1.0000 | ORAL_TABLET | Freq: Every day | ORAL | 0 refills | Status: AC
  Filled 2023-03-08: qty 90, 90d supply, fill #0

## 2023-03-08 MED ORDER — BUDESONIDE-FORMOTEROL FUMARATE 80-4.5 MCG/ACT IN AERO
2.0000 | INHALATION_SPRAY | Freq: Two times a day (BID) | RESPIRATORY_TRACT | 2 refills | Status: DC
  Filled 2023-03-08: qty 1, fill #0

## 2023-03-08 MED ORDER — AMLODIPINE BESYLATE 2.5 MG PO TABS
2.5000 mg | ORAL_TABLET | Freq: Every day | ORAL | 0 refills | Status: DC
  Filled 2023-03-08: qty 90, 90d supply, fill #0

## 2023-03-08 NOTE — Discharge Summary (Addendum)
Discharge Summary  LIERIN LISKA WUJ:[id number] DOB: August 15, 1965  PCP: [name], MD  Admit date: 03/03/2023 Discharge date: 03/08/2023  Time spent: 35 minutes   Recommendations for Outpatient Follow-up:  Follow up with your cardiologist in 1-2 weeks. Follow up with your pulmonologist in 1-2 weeks. Follow up with your PCP. Take your medications as prescribed.   Discharge Diagnoses:  Active Hospital Problems   Diagnosis Date Noted   Acute on chronic respiratory failure with hypoxia and hypercapnia (HCC) 03/03/2023   Pericardial effusion 03/06/2023   Acute hypercapnic respiratory failure (HCC) 03/06/2023   Acute on chronic heart failure with preserved ejection fraction (HCC) 03/06/2023    Resolved Hospital Problems  No resolved problems to display.    Discharge Condition: Stable   Diet recommendation: Heart healthy carb modified diet   Vitals:   03/08/23 0740 03/08/23 0748  BP:  (!) 177/95  Pulse:    Resp:  20  Temp:  98 F (36.7 C)  SpO2: 98% 98%    History of present illness:  57 y.o. 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 Extubated on 03/05/23.  TRH assumed care on 03/06/23.   Hospital course complicated by pericardial effusion incidentally seen on 2D echo done on 03/05/2023, subsequently, cardiology was consulted.  No evidence of tamponade.  CRP 3.2 elevated.  TSH also elevated >15.  Pericardial effusion may be secondary to uncontrolled hypothyroidism versus others.  Cardiology recommended to continue steroids, and added  colchicine.   03/08/2023: The patient was seen and examined at her beside.  No acute events overnight.  She has no new complaints and is eager to go home.  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 Completed Aztreonam and azithromycin for CAP/AECOPD   #Acute Decompensated HFpEF #Hypertension # Pericardial effusion seen on 2D echo with no evidence of cardiac tamponade Follow up with your cardiologist outpatient Net I&O -8.9L   #Diabetes Mellitus Type II with hyperglycemia, exacerbated by steroids. #Hypothyroidism, uncontrolled due to noncompliance. -Continue insulin coverage Target range of 140 to 180 TSH 15.3, free T4, 0.61. Continue levothyroxine.   # Resolved acute Metabolic Encephalopathy in setting of CO2 Narcosis Extubated on 03/05/2023 Mentation is back to baseline. pCO2 is improved 59 from 77, 3 days ago with most current pH of 7.53. Continue BiPAP nightly   OSA on BiPAP nightly Continue qhs   Physical debility Continue PT OT and Fall precautions   Morbid obesity BMI 42 Recommend weight loss outpatient with regular physical activity and healthy dieting.   Resolved AKI, likely prerenal   Discharge Exam: BP (!) 177/95 (BP Location: Left Arm)   Pulse 63   Temp 98 F (36.7 C) (Oral)   Resp 20   Ht 4' 9.01" (1.448 m)   Wt 87.9 kg   LMP 07/15/2016 (Approximate)   SpO2 98%   BMI  41.92 kg/m  General: 57 y.o. year-old female well developed well nourished in no acute distress.  Alert and oriented x3. Cardiovascular: Regular rate and rhythm with no rubs or gallops.  No thyromegaly or JVD noted.    Respiratory: Clear to auscultation with no wheezes or rales. Good inspiratory effort. Abdomen: Soft nontender nondistended with normal bowel sounds x4 quadrants. Musculoskeletal: No lower extremity edema. 2/4 pulses in all 4 extremities. Skin: No ulcerative lesions noted or rashes, Psychiatry: Mood is appropriate for condition and setting  Discharge Instructions You were cared for by a hospitalist during your hospital stay. If you have any questions about your discharge medications or the care you received while you were in the hospital after you are discharged, you can call the unit and asked to speak with the hospitalist on call if the hospitalist that took care of you is not available. Once you are discharged, your primary care physician will handle any further medical issues. Please note that NO REFILLS for any discharge medications will be authorized once you are discharged, as it is imperative that you return to your primary care physician (or establish a relationship with a primary care physician if you do not have one) for your aftercare needs so that they can reassess your need for medications and monitor your lab values.   Allergies as of 03/08/2023       Reactions   Ceclor [cefaclor] Hives   Levofloxacin Rash   Lisinopril Cough   Ofloxacin Swelling   Penicillins Anaphylaxis   Ranitidine Hives   Tomato Rash        Medication List     STOP taking these medications    atenolol 50 MG tablet Commonly known as: TENORMIN   furosemide 20 MG tablet Commonly known as: LASIX   hydrOXYzine 10 MG tablet Commonly known as: ATARAX   lansoprazole 30 MG capsule Commonly known as: PREVACID       TAKE these medications    acetaminophen 500 MG tablet Commonly known as: TYLENOL Take 500 mg by mouth every 6 (six) hours as needed.   albuterol 108 (90 Base) MCG/ACT inhaler Commonly known as: VENTOLIN HFA INHALE 2 PUFFS BY MOUTH FOUR TIMES A DAY. REPLACES VENTOLIN.    amLODipine 2.5 MG tablet Commonly known as: NORVASC Take 1 tablet (2.5 mg total) by mouth daily. Start taking on: March 09, 2023   Blood Pressure Kit USE AS DIRECTED DAILY.   gabapentin 600 MG tablet Commonly known as: NEURONTIN Take (1/2) tablet (300 mg total) by mouth once daily at bedtime. What changed: Another medication with the same name was removed. Continue taking this medication, and follow the directions you see here.   glipiZIDE 5 MG tablet Commonly known as: GLUCOTROL Take 1 tablet (5 mg total) by mouth daily before breakfast.   levothyroxine 175 MCG tablet Commonly known as: SYNTHROID Take 1 tablet (175 mcg total) by mouth daily before breakfast.   losartan 100 MG tablet Commonly known as: COZAAR Take 1 tablet (100 mg total) by mouth daily.   multivitamin with minerals Tabs tablet Take 1 tablet by mouth daily. Start taking on: March 09, 2023   pantoprazole 40 MG tablet Commonly known as: PROTONIX Take 1 tablet (40 mg total) by mouth at bedtime.   pravastatin 40 MG tablet Commonly known as: PRAVACHOL Take 1 tablet (40 mg total) by mouth daily at 6 PM. What changed:  when to take this Another medication with the same name was removed. Continue taking this medication,  and follow the directions you see here.   Rightest GL300 Lancets Misc USE AS DIRECTED TO CHECK BLOOD SUGAR UP TO 4 TIMES DAILY.   Rightest GM550 Blood Glucose w/Device Kit USE AS DIRECTED TO CHECK BLOOD SUGAR UP TO 4 TIMES DAILY.   Rightest GS550 Blood Glucose test strip Generic drug: glucose blood USE AS DIRECTED TO CHECK BLOOD SUGAR UP TO 4 TIMES DAILY.   spironolactone 25 MG tablet Commonly known as: ALDACTONE Take 1 tablet (25 mg total) by mouth daily.   Vitamin D (Ergocalciferol) 1.25 MG (50000 UNIT) Caps capsule Commonly known as: DRISDOL Take 1 capsule (50,000 Units total) by mouth every 7 (seven) days.   Wixela Inhub 250-50 MCG/ACT Aepb Generic drug:  fluticasone-salmeterol Inhale 1 puff into the lungs in the morning and at bedtime.               Durable Medical Equipment  (From admission, onward)           Start     Ordered   03/08/23 1211  For home use only DME oxygen  Once       Question Answer Comment  Length of Need 6 Months   Mode or (Route) Nasal cannula   Liters per Minute 3   Frequency Continuous (stationary and portable oxygen unit needed)   Oxygen conserving device Yes   Oxygen delivery system Gas      03/08/23 1211           Allergies  Allergen Reactions   Ceclor [Cefaclor] Hives   Levofloxacin Rash   Lisinopril Cough   Ofloxacin Swelling   Penicillins Anaphylaxis   Ranitidine Hives   Tomato Rash    Follow-up Information     [name], MD. Call today.   Specialty: Pulmonary Disease Why: Please call for a posthospital follow-up appointment. Contact information: [address] [place] [address] [phone]                  The results of significant diagnostics from this hospitalization (including imaging, microbiology, ancillary and laboratory) are listed below for reference.    Significant Diagnostic Studies: ECHOCARDIOGRAM LIMITED  Result Date: 03/07/2023    ECHOCARDIOGRAM LIMITED REPORT   Patient Name:   JULYSSA BLACKLER [facility] Date of Exam: 03/06/2023 Medical Rec #:  [id number]          Height:       57.0 in Accession #:    [id number]         Weight:       199.5 lb Date of Birth:  [birth year]           BSA:          1.797 m Patient Age:    57 years           BP:           165/74 mmHg Patient Gender: F                  HR:           71 bpm. Exam Location:  [facility] Procedure: Limited Echo, Cardiac Doppler and Color Doppler Indications:     Pericardial effusion I31.3  History:         Patient has no prior history of Echocardiogram examinations.                  Risk Factors:Hypertension, Diabetes and Current Smoker.  Sonographer:     Actuary  Referring Phys:  [id number]  SHERI HAMMOCK Diagnosing Phys: Cristal Deer End MD  Sonographer Comments: Technically difficult study due to poor echo windows. IMPRESSIONS  1. Left ventricular ejection fraction, by estimation, is >55%. The left ventricle has normal function. Left ventricular endocardial border not optimally defined to evaluate regional wall motion. There is moderate left ventricular hypertrophy.  2. Right ventricular systolic function is normal. The right ventricular size is normal.  3. There is a small to moderate pericardial effusion. There is no evidence of cardiac tamponade. Comparison(s): A prior study was performed on 03/05/2023. Pericardial effusion appears smaller, though quality of this study is notably worse compared to prior exam. FINDINGS  Left Ventricle: Left ventricular ejection fraction, by estimation, is >55%. The left ventricle has normal function. Left ventricular endocardial border not optimally defined to evaluate regional wall motion. The left ventricular internal cavity size was  normal in size. There is moderate left ventricular hypertrophy. Right Ventricle: The right ventricular size is normal. Right ventricular systolic function is normal. Pericardium: There is a small to moderate pericardial effusion. There is no evidence of cardiac tamponade. Aortic Valve: Aortic valve mean gradient measures 7.0 mmHg. Aortic valve peak gradient measures 15.2 mmHg. Aortic valve area, by VTI measures 1.98 cm. LEFT VENTRICLE PLAX 2D LVIDd:         4.40 cm LVIDs:         2.90 cm LV PW:         1.30 cm LV IVS:        1.50 cm LVOT diam:     1.70 cm LV SV:         63 LV SV Index:   35 LVOT Area:     2.27 cm  RIGHT VENTRICLE RV Basal diam:  3.00 cm RV Mid diam:    2.70 cm AORTIC VALVE                     PULMONIC VALVE AV Area (Vmax):    1.91 cm      PV Vmax:          1.17 m/s AV Area (Vmean):   2.12 cm      PV Peak grad:     5.5 mmHg AV Area (VTI):     1.98 cm      PR End Diast Vel: 5.76 msec AV Vmax:           195.00 cm/s    RVOT Peak grad:   4 mmHg AV Vmean:          122.000 cm/s AV VTI:            0.320 m AV Peak Grad:      15.2 mmHg AV Mean Grad:      7.0 mmHg LVOT Vmax:         164.00 cm/s LVOT Vmean:        114.000 cm/s LVOT VTI:          0.279 m LVOT/AV VTI ratio: 0.87  AORTA Ao Asc diam: 3.10 cm MITRAL VALVE MV Area (PHT): 3.74 cm    SHUNTS MV Decel Time: 203 msec    Systemic VTI:  0.28 m MV E velocity: 70.70 cm/s  Systemic Diam: 1.70 cm MV A velocity: 98.00 cm/s MV E/A ratio:  0.72 Cristal Deer End MD Electronically signed by Yvonne Kendall MD Signature Date/Time: 03/07/2023/8:02:43 AM    Final    ECHOCARDIOGRAM LIMITED  Result Date: 03/05/2023    ECHOCARDIOGRAM LIMITED REPORT  Patient Name:   SERETA GAVER [facility] Date of Exam: 03/05/2023 Medical Rec #:  [id number]          Height:       57.0 in Accession #:    [id number]         Weight:       200.6 lb Date of Birth:  [birth year]           BSA:          1.801 m Patient Age:    57 years           BP:           153/77 mmHg Patient Gender: F                  HR:           62 bpm. Exam Location:  [facility] Procedure: Limited Echo and Cardiac Doppler Indications:     Pericardial Effusion I31.3  History:         Patient has prior history of Echocardiogram examinations, most                  recent 03/04/2023.  Sonographer:     Overton Mam RDCS, FASE Referring Phys:  [id number] Judithe Modest Diagnosing Phys: Chilton Si MD  Sonographer Comments: Technically difficult study due to poor echo windows, echo performed with patient supine and on artificial respirator and patient is obese. Image acquisition challenging due to patient body habitus. IMPRESSIONS  1. Left ventricular ejection fraction, by estimation, is 65 to 70%. The left ventricle has normal function. There is severe concentric left ventricular hypertrophy. Left ventricular diastolic parameters are consistent with Grade I diastolic dysfunction (impaired relaxation).  2. Large pericardial effusion 2.0 cm from LV free wall.  Unchanged from 03/04/23. Large pericardial effusion. There is no evidence of cardiac tamponade. FINDINGS  Left Ventricle: Left ventricular ejection fraction, by estimation, is 65 to 70%. The left ventricle has normal function. There is severe concentric left ventricular hypertrophy. Left ventricular diastolic parameters are consistent with Grade I diastolic  dysfunction (impaired relaxation). Pericardium: Large pericardial effusion 2.0 cm from LV free wall. Unchanged from 03/04/23. A large pericardial effusion is present. There is no evidence of cardiac tamponade. Venous: IVC assessment for right atrial pressure unable to be performed due to mechanical ventilation. LEFT VENTRICLE PLAX 2D LVIDd:         3.60 cm LVIDs:         2.20 cm LV PW:         2.10 cm LV IVS:        1.80 cm  Chilton Si MD Electronically signed by Chilton Si MD Signature Date/Time: 03/05/2023/1:16:31 PM    Final    DG Chest Port 1 View  Result Date: 03/04/2023 CLINICAL DATA:  Short of breath EXAM: PORTABLE CHEST 1 VIEW COMPARISON:  03/04/2023 4 a.m. FINDINGS: Tracheostomy 2 scratch the endotracheal tube and NG tube unchanged. Low lung volumes and enlarged cardiac silhouette. Mild venous congestion. No pneumothorax. No overt edema. IMPRESSION: 1. Low lung volumes and venous congestion. 2. No overt edema. 3. Stable support apparatus. Electronically Signed   By: Genevive Bi M.D.   On: 03/04/2023 15:41   ECHOCARDIOGRAM COMPLETE  Result Date: 03/04/2023    ECHOCARDIOGRAM REPORT   Patient Name:   MAZE SKLUZACEK [facility] Date of Exam: 03/04/2023 Medical Rec #:  [id number]          Height:  57.0 in Accession #:    [id number]         Weight:       214.9 lb Date of Birth:  1965/12/12           BSA:          1.855 m Patient Age:    57 years           BP:           120/61 mmHg Patient Gender: F                  HR:           67 bpm. Exam Location:  [facility] Procedure: 2D Echo, Cardiac Doppler, Color Doppler and Intracardiac             Opacification Agent Indications:     Elevated Troponin  History:         Patient has no prior history of Echocardiogram examinations.                  Signs/Symptoms:Chest Pain; Risk Factors:Diabetes, Dyslipidemia                  and Current Smoker.  Sonographer:     Mikki Harbor Referring Phys:  [id number] Judithe Modest Diagnosing Phys: Julien Nordmann MD IMPRESSIONS  1. Left ventricular ejection fraction, by estimation, is 60 to 65%. The left ventricle has normal function. The left ventricle has no regional wall motion abnormalities. There is severe left ventricular hypertrophy. Left ventricular diastolic parameters  are consistent with Grade I diastolic dysfunction (impaired relaxation).  2. Right ventricular systolic function is normal. The right ventricular size is normal.  3. Left atrial size was mildly dilated.  4. Moderate pericardial effusion estimated 2.0 cm off the LV freee wall.There is no evidence of cardiac tamponade.  5. The mitral valve is normal in structure. No evidence of mitral valve regurgitation. No evidence of mitral stenosis.  6. The aortic valve has an indeterminant number of cusps. Aortic valve regurgitation is not visualized. Aortic valve sclerosis is present, with no evidence of aortic valve stenosis.  7. The inferior vena cava is dilated in size with >50% respiratory variability, suggesting right atrial pressure of 8 mmHg. FINDINGS  Left Ventricle: Left ventricular ejection fraction, by estimation, is 60 to 65%. The left ventricle has normal function. The left ventricle has no regional wall motion abnormalities. Definity contrast agent was given IV to delineate the left ventricular  endocardial borders. The left ventricular internal cavity size was normal in size. There is severe left ventricular hypertrophy. Left ventricular diastolic parameters are consistent with Grade I diastolic dysfunction (impaired relaxation). Right Ventricle: The right ventricular size is normal. No increase  in right ventricular wall thickness. Right ventricular systolic function is normal. Left Atrium: Left atrial size was mildly dilated. Right Atrium: Right atrial size was normal in size. Pericardium: A moderately sized pericardial effusion is present. There is no evidence of cardiac tamponade. Mitral Valve: The mitral valve is normal in structure. No evidence of mitral valve regurgitation. No evidence of mitral valve stenosis. MV peak gradient, 3.0 mmHg. The mean mitral valve gradient is 1.0 mmHg. Tricuspid Valve: The tricuspid valve is normal in structure. Tricuspid valve regurgitation is not demonstrated. No evidence of tricuspid stenosis. Aortic Valve: The aortic valve has an indeterminant number of cusps. Aortic valve regurgitation is not visualized. Aortic valve sclerosis is present, with no evidence of aortic valve stenosis. Aortic valve mean  gradient measures 6.0 mmHg. Aortic valve peak gradient measures 11.2 mmHg. Aortic valve area, by VTI measures 2.81 cm. Pulmonic Valve: The pulmonic valve was normal in structure. Pulmonic valve regurgitation is not visualized. No evidence of pulmonic stenosis. Aorta: The aortic root is normal in size and structure. Venous: The inferior vena cava is dilated in size with greater than 50% respiratory variability, suggesting right atrial pressure of 8 mmHg. IAS/Shunts: No atrial level shunt detected by color flow Doppler.  LEFT VENTRICLE PLAX 2D LVIDd:         3.90 cm   Diastology LVIDs:         2.10 cm   LV e' medial:    3.92 cm/s LV PW:         2.20 cm   LV E/e' medial:  19.3 LV IVS:        2.00 cm   LV e' lateral:   2.39 cm/s LVOT diam:     2.00 cm   LV E/e' lateral: 31.6 LV SV:         87 LV SV Index:   47 LVOT Area:     3.14 cm  RIGHT VENTRICLE RV Basal diam:  3.80 cm LEFT ATRIUM             Index        RIGHT ATRIUM           Index LA diam:        4.50 cm 2.43 cm/m   RA Area:     18.60 cm LA Vol (A2C):   59.6 ml 32.14 ml/m  RA Volume:   49.50 ml  26.69 ml/m LA Vol  (A4C):   55.7 ml 30.03 ml/m LA Biplane Vol: 62.3 ml 33.59 ml/m  AORTIC VALVE                     PULMONIC VALVE AV Area (Vmax):    2.77 cm      PV Vmax:       1.09 m/s AV Area (Vmean):   2.69 cm      PV Peak grad:  4.8 mmHg AV Area (VTI):     2.81 cm AV Vmax:           167.00 cm/s AV Vmean:          109.000 cm/s AV VTI:            0.309 m AV Peak Grad:      11.2 mmHg AV Mean Grad:      6.0 mmHg LVOT Vmax:         147.00 cm/s LVOT Vmean:        93.400 cm/s LVOT VTI:          0.276 m LVOT/AV VTI ratio: 0.89  AORTA Ao Root diam: 3.00 cm MITRAL VALVE MV Area (PHT): 3.30 cm    SHUNTS MV Area VTI:   2.64 cm    Systemic VTI:  0.28 m MV Peak grad:  3.0 mmHg    Systemic Diam: 2.00 cm MV Mean grad:  1.0 mmHg MV Vmax:       0.87 m/s MV Vmean:      53.1 cm/s MV Decel Time: 230 msec MV E velocity: 75.50 cm/s MV A velocity: 84.90 cm/s MV E/A ratio:  0.89 Julien Nordmann MD Electronically signed by Julien Nordmann MD Signature Date/Time: 03/04/2023/1:42:07 PM    Final    DG Chest Port 1 View  Result Date: 03/04/2023 CLINICAL  DATA:  Acute respiratory failure hypoxia hypercarbia EXAM: PORTABLE CHEST 1 VIEW COMPARISON:  CXR 03/03/23 FINDINGS: Cardiomegaly. Endotracheal tube terminates 1.5 cm above the carina. Enteric tube courses below diaphragm with the side hole poorly visualized in the tip out of the field of view. No pleural effusion. No pneumothorax. Pulmonary venous congestion without overt pulmonary edema. No radiographically apparent displaced rib fractures. Visualized upper abdomen unremarkable. IMPRESSION: 1. Cardiomegaly with pulmonary venous congestion. No overt pulmonary edema. 2. Endotracheal tube terminates 1.5 cm above the carina. Electronically Signed   By: Lorenza Cambridge M.D.   On: 03/04/2023 07:57   DG Abdomen 1 View  Result Date: 03/03/2023 CLINICAL DATA:  Orogastric tube placement. EXAM: ABDOMEN - 1 VIEW COMPARISON:  Abdominal radiographs 05/26/2022. Abdominal CT 12/16/2014. FINDINGS: 1448 hours.  Enteric tube is in place with the side hole near the gastroesophageal junction. The visualized bowel gas pattern is nonobstructive. Mild atelectasis noted at both lung bases. Cholecystectomy clips and multiple telemetry leads overlying the abdomen are noted. IMPRESSION: Enteric tube side hole is near the gastroesophageal junction. Recommend tube advancement. Electronically Signed   By: Carey Bullocks M.D.   On: 03/03/2023 15:17   DG Chest Portable 1 View  Result Date: 03/03/2023 CLINICAL DATA:  Status post intubation. EXAM: PORTABLE CHEST 1 VIEW COMPARISON:  Chest radiograph 03/03/2023 at 1304 hours. FINDINGS: 1445 hours. Endotracheal tube tip projects over the midthoracic trachea. Enteric tube side port projects over the stomach. Unchanged cardiomegaly and pulmonary venous congestion. Airspace opacity along the right border may reflect atelectasis. No pleural effusion or pneumothorax. IMPRESSION: 1. Endotracheal tube tip projects over the midthoracic trachea. 2. Unchanged cardiomegaly and pulmonary venous congestion. Electronically Signed   By: Orvan Falconer M.D.   On: 03/03/2023 15:17   DG Chest Port 1 View  Result Date: 03/03/2023 CLINICAL DATA:  Shortness of breath EXAM: PORTABLE CHEST 1 VIEW COMPARISON:  Chest x-ray 02/16/2018 FINDINGS: Enlarged cardiopericardial silhouette with vascular congestion. No pneumothorax or effusion. No edema. Overlapping cardiac leads. IMPRESSION: Enlarged cardiopericardial silhouette with some vascular congestion. Electronically Signed   By: Karen Kays M.D.   On: 03/03/2023 14:01    Microbiology: Recent Results (from the past 240 hour(s))  Resp panel by RT-PCR (RSV, Flu A&B, Covid) Anterior Nasal Swab     Status: None   Collection Time: 03/03/23 12:27 PM   Specimen: Anterior Nasal Swab  Result Value Ref Range Status   SARS Coronavirus 2 by RT PCR NEGATIVE NEGATIVE Final    Comment: (NOTE) SARS-CoV-2 target nucleic acids are NOT DETECTED.  The SARS-CoV-2 RNA  is generally detectable in upper respiratory specimens during the acute phase of infection. The lowest concentration of SARS-CoV-2 viral copies this assay can detect is 138 copies/mL. A negative result does not preclude SARS-Cov-2 infection and should not be used as the sole basis for treatment or other patient management decisions. A negative result may occur with  improper specimen collection/handling, submission of specimen other than nasopharyngeal swab, presence of viral mutation(s) within the areas targeted by this assay, and inadequate number of viral copies(<138 copies/mL). A negative result must be combined with clinical observations, patient history, and epidemiological information. The expected result is Negative.  Fact Sheet for Patients:  BloggerCourse.com  Fact Sheet for Healthcare Providers:  SeriousBroker.it  This test is no t yet approved or cleared by the [place] FDA and  has been authorized for detection and/or diagnosis of SARS-CoV-2 by FDA under an Emergency Use Authorization (EUA). This EUA will remain  in effect (meaning this test can be used) for the duration of the COVID-19 declaration under Section 564(b)(1) of the Act, 21 U.S.C.section 360bbb-3(b)(1), unless the authorization is terminated  or revoked sooner.       Influenza A by PCR NEGATIVE NEGATIVE Final   Influenza B by PCR NEGATIVE NEGATIVE Final    Comment: (NOTE) The Xpert Xpress SARS-CoV-2/FLU/RSV plus assay is intended as an aid in the diagnosis of influenza from Nasopharyngeal swab specimens and should not be used as a sole basis for treatment. Nasal washings and aspirates are unacceptable for Xpert Xpress SARS-CoV-2/FLU/RSV testing.  Fact Sheet for Patients: BloggerCourse.com  Fact Sheet for Healthcare Providers: SeriousBroker.it  This test is not yet approved or cleared by the  [place] FDA and has been authorized for detection and/or diagnosis of SARS-CoV-2 by FDA under an Emergency Use Authorization (EUA). This EUA will remain in effect (meaning this test can be used) for the duration of the COVID-19 declaration under Section 564(b)(1) of the Act, 21 U.S.C. section 360bbb-3(b)(1), unless the authorization is terminated or revoked.     Resp Syncytial Virus by PCR NEGATIVE NEGATIVE Final    Comment: (NOTE) Fact Sheet for Patients: BloggerCourse.com  Fact Sheet for Healthcare Providers: SeriousBroker.it  This test is not yet approved or cleared by the [place] FDA and has been authorized for detection and/or diagnosis of SARS-CoV-2 by FDA under an Emergency Use Authorization (EUA). This EUA will remain in effect (meaning this test can be used) for the duration of the COVID-19 declaration under Section 564(b)(1) of the Act, 21 U.S.C. section 360bbb-3(b)(1), unless the authorization is terminated or revoked.  Performed at [facility], [address] [place], [place] [address]   Culture, Respiratory w Gram Stain     Status: None   Collection Time: 03/03/23  3:47 PM   Specimen: SPU; Respiratory  Result Value Ref Range Status   Specimen Description   Final    SPUTUM Performed at [facility], [address] [place], [place] [address]    Special Requests   Final    TASP Performed at [facility], [address] [place], [place] [address]    Gram Stain   Final    FEW WBC PRESENT, PREDOMINANTLY MONONUCLEAR FEW GRAM POSITIVE COCCI IN PAIRS    Culture   Final    Normal respiratory flora-no Staph aureus or Pseudomonas seen Performed at [facility], [address] [place], [place] [address]    Report Status 03/06/2023 FINAL  Final  MRSA Next Gen by PCR, Nasal     Status: None   Collection Time: 03/03/23  4:28 PM   Specimen: Nasal Mucosa; Nasal Swab   Result Value Ref Range Status   MRSA by PCR Next Gen NOT DETECTED NOT DETECTED Final    Comment: (NOTE) The GeneXpert MRSA Assay (FDA approved for NASAL specimens only), is one component of a comprehensive MRSA colonization surveillance program. It is not intended to diagnose MRSA infection nor to guide or monitor treatment for MRSA infections. Test performance is not FDA approved in patients less than 44 years old. Performed at [facility], [address] [place], [place] [address]   Respiratory (~20 pathogens) panel by PCR     Status: None   Collection Time: 03/03/23 11:06 PM   Specimen: Nasopharyngeal Swab; Respiratory  Result Value Ref Range Status   Adenovirus NOT DETECTED NOT DETECTED Final   Coronavirus 229E NOT DETECTED NOT DETECTED Final  Comment: (NOTE) The Coronavirus on the Respiratory Panel, DOES NOT test for the novel  Coronavirus (2019 nCoV)    Coronavirus HKU1 NOT DETECTED NOT DETECTED Final   Coronavirus NL63 NOT DETECTED NOT DETECTED Final   Coronavirus OC43 NOT DETECTED NOT DETECTED Final   Metapneumovirus NOT DETECTED NOT DETECTED Final   Rhinovirus / Enterovirus NOT DETECTED NOT DETECTED Final   Influenza A NOT DETECTED NOT DETECTED Final   Influenza B NOT DETECTED NOT DETECTED Final   Parainfluenza Virus 1 NOT DETECTED NOT DETECTED Final   Parainfluenza Virus 2 NOT DETECTED NOT DETECTED Final   Parainfluenza Virus 3 NOT DETECTED NOT DETECTED Final   Parainfluenza Virus 4 NOT DETECTED NOT DETECTED Final   Respiratory Syncytial Virus NOT DETECTED NOT DETECTED Final   Bordetella pertussis NOT DETECTED NOT DETECTED Final   Bordetella Parapertussis NOT DETECTED NOT DETECTED Final   Chlamydophila pneumoniae NOT DETECTED NOT DETECTED Final   Mycoplasma pneumoniae NOT DETECTED NOT DETECTED Final    Comment: Performed at [facility], [address], [place], [place] [address]     Labs: Basic Metabolic Panel: Recent Labs  Lab  03/03/23 1227 03/04/23 1914 03/05/23 0528 03/06/23 0335 03/07/23 0321 03/08/23 0406  NA 136 135 137 136 136 137  K 4.7 4.2 3.9 3.7 3.4* 3.4*  CL 86* 86* 86* 83* 85* 87*  CO2 39* 34* 41* 43* 39* 37*  GLUCOSE 100* 118* 84 84 97 100*  BUN 10 11 17 19  21* 27*  CREATININE 0.81 1.04* 1.15* 1.02* 1.07* 0.97  CALCIUM 8.4* 8.2* 8.1* 8.3* 8.5* 8.6*  MG 2.0 1.7 2.3  --   --  2.1  PHOS  --  4.5 5.3* 4.7* 2.9 3.7   Liver Function Tests: Recent Labs  Lab 03/04/23 0652 03/05/23 0528 03/06/23 0335 03/07/23 0321 03/08/23 0406  AST 34  --   --   --   --   ALT 19  --   --   --   --   ALKPHOS 46  --   --   --   --   BILITOT 0.9  --   --   --   --   PROT 5.5*  --   --   --   --   ALBUMIN 2.7*  2.3* 2.8* 3.1* 3.0* 2.9*   No results for input(s): "LIPASE", "AMYLASE" in the last 168 hours. No results for input(s): "AMMONIA" in the last 168 hours. CBC: Recent Labs  Lab 03/03/23 1227 03/04/23 0652 03/05/23 0528 03/06/23 0335 03/07/23 0321 03/08/23 0406  WBC 6.3 8.9 9.9 12.5* 8.7 8.9  NEUTROABS 4.2  --   --   --   --   --   HGB 16.4* 16.4* 15.5* 15.2* 15.7* 16.2*  HCT 56.3* 53.2* 50.1* 50.2* 52.2* 52.7*  MCV 89.9 85.1 84.2 85.1 86.1 84.3  PLT 174 182 178 160 164 173   Cardiac Enzymes: No results for input(s): "CKTOTAL", "CKMB", "CKMBINDEX", "TROPONINI" in the last 168 hours. BNP: BNP (last 3 results) Recent Labs    03/03/23 1227  BNP 1,751.3*    ProBNP (last 3 results) No results for input(s): "PROBNP" in the last 8760 hours.  CBG: Recent Labs  Lab 03/07/23 1649 03/07/23 1958 03/08/23 0001 03/08/23 0836 03/08/23 1206  GLUCAP 160* 243* 175* 90 172*       Signed:  Darlin Drop, MD [facility] 03/08/2023, 8:00 PM

## 2023-03-08 NOTE — TOC Progression Note (Signed)
Transition of Care [facility]) - Progression Note    Patient Details  Name: Lindsey Benjamin MRN: [id number] Date of Birth: [birth year]  [facility] CM/SW Contact  Truddie Hidden, RN Phone Number: 03/08/2023, 9:05 AM  Clinical Narrative:    Patient referral declined by [facility].         Expected Discharge Plan and Services         Expected Discharge Date: 03/08/23                                     Social Determinants of Health (SDOH) Interventions SDOH Screenings   Food Insecurity: No Food Insecurity (03/05/2023)  Housing: Low Risk  (03/05/2023)  Transportation Needs: No Transportation Needs (03/05/2023)  Utilities: Not At Risk (03/05/2023)  Alcohol Screen: Low Risk  (11/11/2021)  Depression (PHQ2-9): Low Risk  (11/11/2021)  Financial Resource Strain: Low Risk  (02/01/2018)  Physical Activity: Inactive (02/01/2018)  Social Connections: Moderately Integrated (02/01/2018)  Stress: No Stress Concern Present (02/01/2018)  Tobacco Use: High Risk (03/03/2023)  Health Literacy: Low Risk  (10/09/2020)   Received from [facility]    Readmission Risk Interventions     No data to display

## 2023-03-08 NOTE — TOC Transition Note (Signed)
Transition of Care [facility]) - CM/SW Discharge Note   Patient Details  Name: Lindsey Benjamin MRN: [id number] Date of Birth: 1966/06/21  Transition of Care [facility]) CM/SW Contact:  Truddie Hidden, RN Phone Number: 03/08/2023, 12:56 PM   Clinical Narrative:    Spoke with patient at bedside regarding discharge plans for home today. She  states her oxygen is supplied from [facility]. She has needed supplies at home. She does not have transportation home or her home oxygen tanks to go home with. Patient stated her husband is home he is confined to a WC but is able to let her in. EMS arranged.   TOC signing off.            Patient Goals and CMS Choice      Discharge Placement                         Discharge Plan and Services Additional resources added to the After Visit Summary for                                       Social Determinants of Health (SDOH) Interventions SDOH Screenings   Food Insecurity: No Food Insecurity (03/05/2023)  Housing: Low Risk  (03/05/2023)  Transportation Needs: No Transportation Needs (03/05/2023)  Utilities: Not At Risk (03/05/2023)  Alcohol Screen: Low Risk  (11/11/2021)  Depression (PHQ2-9): Low Risk  (11/11/2021)  Financial Resource Strain: Low Risk  (02/01/2018)  Physical Activity: Inactive (02/01/2018)  Social Connections: Moderately Integrated (02/01/2018)  Stress: No Stress Concern Present (02/01/2018)  Tobacco Use: High Risk (03/03/2023)  Health Literacy: Low Risk  (10/09/2020)   Received from [facility], [facility] [place] [facility]     Readmission Risk Interventions     No data to display

## 2023-03-08 NOTE — Progress Notes (Signed)
Cardiology Progress Note   Patient Name: Lindsey Benjamin Date of Encounter: 03/08/2023  Primary Cardiologist: [facility]  Subjective   Feels well.  No chest pain or dyspnea.  Eager to go home.  Inpatient Medications    Scheduled Meds:  budesonide (PULMICORT) nebulizer solution  0.5 mg Nebulization BID   Chlorhexidine Gluconate Cloth  6 each Topical Daily   docusate  100 mg Oral BID   enoxaparin (LOVENOX) injection  50 mg Subcutaneous Q24H   furosemide  40 mg Intravenous Daily   insulin aspart  0-5 Units Subcutaneous QHS   insulin aspart  0-9 Units Subcutaneous TID WC   ipratropium-albuterol  3 mL Nebulization BID   levothyroxine  175 mcg Oral QAC breakfast   losartan  100 mg Oral Daily   methylPREDNISolone (SOLU-MEDROL) injection  40 mg Intravenous Daily   multivitamin with minerals  1 tablet Oral Daily   pantoprazole (PROTONIX) IV  40 mg Intravenous QHS   polyethylene glycol  17 g Oral Daily   pravastatin  40 mg Oral q1800   spironolactone  25 mg Oral Daily   Continuous Infusions:  sodium chloride Stopped (03/04/23 1632)   azithromycin 250 mg (03/08/23 1014)   aztreonam 2 g (03/08/23 0857)   PRN Meds: docusate sodium, hydrALAZINE, polyethylene glycol   Vital Signs    Vitals:   03/08/23 0414 03/08/23 0600 03/08/23 0740 03/08/23 0748  BP: (!) 174/90   (!) 177/95  Pulse: 63     Resp:    20  Temp: 97.7 F (36.5 C)   98 F (36.7 C)  TempSrc: Oral   Oral  SpO2: 93%  98% 98%  Weight:  87.9 kg    Height:        Intake/Output Summary (Last 24 hours) at 03/08/2023 1127 Last data filed at 03/07/2023 2100 Gross per 24 hour  Intake 348 ml  Output --  Net 348 ml   Filed Weights   03/06/23 0500 03/07/23 0500 03/08/23 0600  Weight: 90.5 kg 89 kg 87.9 kg    Physical Exam   GEN: Well nourished, well developed, in no acute distress.  HEENT: Grossly normal.  Neck: Supple, no JVD, carotid bruits, or masses. Cardiac: RRR, no murmurs, rubs, or gallops. No clubbing,  cyanosis, edema.  Radials 2+, DP/PT 2+ and equal bilaterally.  Respiratory:  Respirations regular and unlabored, coarse breath sounds bilaterally. GI: Soft, nontender, nondistended, BS + x 4. MS: no deformity or atrophy. Skin: warm and dry, no rash. Neuro:  Strength and sensation are intact. Psych: AAOx3.  Normal affect.  Labs    Chemistry Recent Labs  Lab 03/04/23 [phone] 03/05/23 0528 03/06/23 0335 03/07/23 0321 03/08/23 0406  NA 135   < > 136 136 137  K 4.2   < > 3.7 3.4* 3.4*  CL 86*   < > 83* 85* 87*  CO2 34*   < > 43* 39* 37*  GLUCOSE 118*   < > 84 97 100*  BUN 11   < > 19 21* 27*  CREATININE 1.04*   < > 1.02* 1.07* 0.97  CALCIUM 8.2*   < > 8.3* 8.5* 8.6*  PROT 5.5*  --   --   --   --   ALBUMIN 2.7*  2.3*   < > 3.1* 3.0* 2.9*  AST 34  --   --   --   --   ALT 19  --   --   --   --   ALKPHOS 46  --   --   --   --  BILITOT 0.9  --   --   --   --   GFRNONAA >60   < > >60 60* >60  ANIONGAP 15   < > 10 12 13    < > = values in this interval not displayed.     Hematology Recent Labs  Lab 03/06/23 0335 03/07/23 0321 03/08/23 0406  WBC 12.5* 8.7 8.9  RBC 5.90* 6.06* 6.25*  HGB 15.2* 15.7* 16.2*  HCT 50.2* 52.2* 52.7*  MCV 85.1 86.1 84.3  MCH 25.8* 25.9* 25.9*  MCHC 30.3 30.1 30.7  RDW 16.9* 16.8* 18.1*  PLT 160 164 173   BNP    Component Value Date/Time   BNP 1,751.3 (H) 03/03/2023 1227   Lipids  Lab Results  Component Value Date   CHOL 195 06/10/2021   HDL 39 (L) 06/10/2021   LDLCALC 108 (H) 06/10/2021   TRIG 277 (H) 06/10/2021   CHOLHDL 5.0 (H) 06/10/2021    GEX5M  Lab Results  Component Value Date   HGBA1C 7.3 (H) 03/04/2023    Radiology    DG Chest Port 1 View  Result Date: 03/04/2023 CLINICAL DATA:  Short of breath EXAM: PORTABLE CHEST 1 VIEW COMPARISON:  03/04/2023 4 a.m. FINDINGS: Tracheostomy 2 scratch the endotracheal tube and NG tube unchanged. Low lung volumes and enlarged cardiac silhouette. Mild venous congestion. No pneumothorax.  No overt edema. IMPRESSION: 1. Low lung volumes and venous congestion. 2. No overt edema. 3. Stable support apparatus. Electronically Signed   By: Genevive Bi M.D.   On: 03/04/2023 15:41    Telemetry    Non-tele  Cardiac Studies   2D Echocardiogram 9.1.2024  1. Left ventricular ejection fraction, by estimation, is >55%. The left  ventricle has normal function. Left ventricular endocardial border not  optimally defined to evaluate regional wall motion. There is moderate left  ventricular hypertrophy.   2. Right ventricular systolic function is normal. The right ventricular  size is normal.   3. There is a small to moderate pericardial effusion. There is no  evidence of cardiac tamponade.   Comparison(s): A prior study was performed on 03/05/2023. Pericardial  effusion appears smaller, though quality of this study is notably worse  compared to prior exam.  _____________   Patient Profile     57 y.o. female w/ a h/o HTN, chronic resp failure on 6 lpm @ home, severe OSA on BiPAP, DMII, asthma, COPD, hypothyroidism, obesity, and tob abuse, who was admitted 8/29 w/ resp failure/hypoxia and incidental finding of pericardial effusion.  Assessment & Plan    1.  Pericardial Effusion:  Initially thought to be mod-large on echo w/ repeat echo 9/1, more consistent w/ small to mod effusion.  No tamponade.  Untreated hypotension felt to be playing a role.  CRP elevated as well.  Corticosteroids used for resp failure.  Colchicine 0.6 mg BID initiated 9/2.  Denies chest pain.  Rec ongoing therapy and f/u w/ primary cardiologist @ [facility] following discharge.  2. Acute on chronic HFpEF:  Breathing improved.  Euvolemic this AM.  Minus 4L yesterday and minus 9.1 L since admission.  Creat stable though BUN rising - 27 this AM. Will transition to lasix 20 mg PO daily - was prev Rx prn only.  BP remains elevated.  Losartan increased to 100 mg daily on 9/2.  Home dose of ? blocker d/c'd in setting of resp  failure/hypoxia.  Will add low-dose amlodipine.  Will need close f/u as outpt.  3.  HTN:  remains  poorly controlled. See #2 - adding amlodipine.  4.  Acute on chronic hypoxic resp failure/COPD:  Breathing much improved.  Abx/steroids/nebs/O2 per IM.  5. Hypokalemia:  supplement.  Signed, Nicolasa Ducking, NP  03/08/2023, 11:27 AM    For questions or updates, please contact   Please consult [email or web address] for contact info under Cardiology/STEMI.

## 2023-03-09 ENCOUNTER — Other Ambulatory Visit: Payer: Self-pay

## 2023-03-09 ENCOUNTER — Other Ambulatory Visit (HOSPITAL_COMMUNITY): Payer: Self-pay

## 2023-03-10 DIAGNOSIS — Z0189 Encounter for other specified special examinations: Secondary | ICD-10-CM

## 2023-03-10 LAB — GLUCOSE, POCT (MANUAL RESULT ENTRY): POC Glucose: 138 mg/dL — AB (ref 70–99)

## 2023-03-10 NOTE — Congregational Nurse Program (Signed)
Dept: [phone]   Congregational Nurse Program Note  Date of Encounter: 03/10/2023 Client to [facility] day center for vital sign and blood glucose check following a hospitalization at [facility]. She has an upcoming apt at the [facility] on 9/11, which was already in place. Client did not bring her AVS, AVS reviewed by RN in client's chart notes. She has an order for 3 liters of oxygen continuous, but this was not on her discharge medication list. She was on room air while at the center this morning. BP (!) 138/90 (BP Location: Left Arm, Patient Position: Sitting, Cuff Size: Large)   Pulse 73  SpO2 90% Comment: on room air. Non-fasting blood glucose 138. Client was strongly encouraged to contact her [facility] neurologist, who originally ordered her oxygen. She has the number and will also contact the oxygen company, which is out of [facility].  Client was alert and oriented through out the visit, very talkative and did not appear to be short of breath. RN encouraged her to come to the center at least monthly for blood pressure check.  Client currently does NOT have insurance. Medicaid application completed during visit and delivered to social Services. [name] BSN, RN Past Medical History: Past Medical History:  Diagnosis Date   (HFpEF) heart failure with preserved ejection fraction (HCC)    Asthma    Chronic hypoxic respiratory failure, on home oxygen therapy (HCC)    6L [place]   COPD (chronic obstructive pulmonary disease) (HCC)    Hypertension    Hypothyroidism    Morbid obesity with BMI of 40.0-44.9, adult (HCC)    Noncompliance with medication regimen    OSA treated with BiPAP    Smoker    Type 2 diabetes mellitus without complication, without long-term current use of insulin (HCC) 03/04/2021    Encounter Details:  CNP Questionnaire - 03/10/23 1010       Questionnaire   Ask client: Do you give verbal consent for me to treat you today? Yes    Student Assistance N/A    Location  Patient Served  [facility]    Visit Setting with Client Organization    Patient Status Unknown   client lives in an efficiancy apt   Insurance Uninsured ([employer]/Care Connects/Self-Pay/Medicaid Family Planning)   has charitable care through [facility]   Insurance/Financial Assistance Referral Medicaid   Medicaid application completed at visit and taken to [facility] today   Medication Have Medication Insecurities    Medical Provider Yes   [facility] and several specilaist at [facility]   Screening Referrals Made N/A    Medical Referrals Made N/A    Medical Appointment Made N/A   client has an apt at the [facility] on 9/11 already in place   Recently w/o PCP, now 1st time PCP visit completed due to CNs referral or appointment made N/A    Food N/A   has food Transport planner N/A   uses the [employer] bus, [employer] bus and a private taxi service   Housing/Utilities N/A    Economist N/A    Interventions Advocate/Support;Navigate Healthcare System;Educate    Abnormal to Normal Screening Since Last CN Visit N/A    Screenings CN Performed N/A    Sent Client to Lab for: N/A    Did client attend any of the following based off CNs referral or appointments made? N/A    ED Visit Averted N/A    Life-Saving Intervention Made N/A

## 2023-03-11 ENCOUNTER — Other Ambulatory Visit: Payer: Self-pay

## 2023-03-11 MED ORDER — FLUTICASONE-SALMETEROL 250-50 MCG/ACT IN AEPB: 1.0000 | INHALATION_SPRAY | Freq: Two times a day (BID) | RESPIRATORY_TRACT | 3 refills | Status: DC

## 2023-03-14 ENCOUNTER — Other Ambulatory Visit: Payer: Self-pay

## 2023-03-16 ENCOUNTER — Other Ambulatory Visit: Payer: Self-pay

## 2023-03-16 ENCOUNTER — Ambulatory Visit: Payer: Self-pay | Admitting: Gerontology

## 2023-03-16 VITALS — BP 157/94 | HR 75 | Ht <= 58 in | Wt 208.0 lb

## 2023-03-16 DIAGNOSIS — I10 Essential (primary) hypertension: Secondary | ICD-10-CM

## 2023-03-16 DIAGNOSIS — M7989 Other specified soft tissue disorders: Secondary | ICD-10-CM

## 2023-03-16 DIAGNOSIS — Z8719 Personal history of other diseases of the digestive system: Secondary | ICD-10-CM

## 2023-03-16 DIAGNOSIS — E119 Type 2 diabetes mellitus without complications: Secondary | ICD-10-CM

## 2023-03-16 DIAGNOSIS — E785 Hyperlipidemia, unspecified: Secondary | ICD-10-CM

## 2023-03-16 DIAGNOSIS — E039 Hypothyroidism, unspecified: Secondary | ICD-10-CM

## 2023-03-16 MED ORDER — PRAVASTATIN SODIUM 40 MG PO TABS
40.0000 mg | ORAL_TABLET | Freq: Every day | ORAL | 0 refills | Status: DC
  Filled 2023-03-16: qty 90, 90d supply, fill #0

## 2023-03-16 MED ORDER — PANTOPRAZOLE SODIUM 40 MG PO TBEC
40.0000 mg | DELAYED_RELEASE_TABLET | Freq: Every day | ORAL | 0 refills | Status: DC
  Filled 2023-03-16: qty 30, 30d supply, fill #0

## 2023-03-16 MED ORDER — LEVOTHYROXINE SODIUM 175 MCG PO TABS
175.0000 ug | ORAL_TABLET | Freq: Every day | ORAL | 0 refills | Status: DC
Start: 2023-03-16 — End: 2023-04-20
  Filled 2023-03-16: qty 90, 90d supply, fill #0

## 2023-03-16 MED ORDER — LOSARTAN POTASSIUM 100 MG PO TABS
100.0000 mg | ORAL_TABLET | Freq: Every day | ORAL | 0 refills | Status: DC
  Filled 2023-03-16: qty 90, 90d supply, fill #0

## 2023-03-16 MED ORDER — GLIPIZIDE 5 MG PO TABS
5.0000 mg | ORAL_TABLET | Freq: Every day | ORAL | 0 refills | Status: DC
  Filled 2023-03-16: qty 30, 30d supply, fill #0

## 2023-03-16 NOTE — Patient Instructions (Signed)
 Carbohydrate Counting for Diabetes Mellitus, Adult Carbohydrate counting is a method of keeping track of how many carbohydrates you eat. Eating carbohydrates increases the amount of sugar (glucose) in the blood. Counting how many carbohydrates you eat improves how well you manage your blood glucose. This, in turn, helps you manage your diabetes. Carbohydrates are measured in grams (g) per serving. It is important to know how many carbohydrates (in grams or by serving size) you can have in each meal. This is different for every person. A dietitian can help you make a meal plan and calculate how many carbohydrates you should have at each meal and snack. What foods contain carbohydrates? Carbohydrates are found in the following foods: Grains, such as breads and cereals. Dried beans and soy products. Starchy vegetables, such as potatoes, peas, and corn. Fruit and fruit juices. Milk and yogurt. Sweets and snack foods, such as cake, cookies, candy, chips, and soft drinks. How do I count carbohydrates in foods? There are two ways to count carbohydrates in food. You can read food labels or learn standard serving sizes of foods. You can use either of these methods or a combination of both. Using the Nutrition Facts label The Nutrition Facts list is included on the labels of almost all packaged foods and beverages in the [place]. It includes: The serving size. Information about nutrients in each serving, including the grams of carbohydrate per serving. To use the Nutrition Facts, decide how many servings you will have. Then, multiply the number of servings by the number of carbohydrates per serving. The resulting number is the total grams of carbohydrates that you will be having. Learning the standard serving sizes of foods When you eat carbohydrate foods that are not packaged or do not include Nutrition Facts on the label, you need to measure the servings in order to count the grams of  carbohydrates. Measure the foods that you will eat with a food scale or measuring cup, if needed. Decide how many standard-size servings you will eat. Multiply the number of servings by 15. For foods that contain carbohydrates, one serving equals 15 g of carbohydrates. For example, if you eat 2 cups or 10 oz (300 g) of strawberries, you will have eaten 2 servings and 30 g of carbohydrates (2 servings x 15 g = 30 g). For foods that have more than one food mixed, such as soups and casseroles, you must count the carbohydrates in each food that is included. The following list contains standard serving sizes of common carbohydrate-rich foods. Each of these servings has about 15 g of carbohydrates: 1 slice of bread. 1 six-inch (15 cm) tortilla. ? cup or 2 oz (53 g) cooked rice or pasta.  cup or 3 oz (85 g) cooked or canned, drained and rinsed beans or lentils.  cup or 3 oz (85 g) starchy vegetable, such as peas, corn, or squash.  cup or 4 oz (120 g) hot cereal.  cup or 3 oz (85 g) boiled or mashed potatoes, or  or 3 oz (85 g) of a large baked potato.  cup or 4 fl oz (118 mL) fruit juice. 1 cup or 8 fl oz (237 mL) milk. 1 small or 4 oz (106 g) apple.  or 2 oz (63 g) of a medium banana. 1 cup or 5 oz (150 g) strawberries. 3 cups or 1 oz (28.3 g) popped popcorn. What is an example of carbohydrate counting? To calculate the grams of carbohydrates in this sample meal, follow the steps  shown below. Sample meal 3 oz (85 g) chicken breast. ? cup or 4 oz (106 g) brown rice.  cup or 3 oz (85 g) corn. 1 cup or 8 fl oz (237 mL) milk. 1 cup or 5 oz (150 g) strawberries with sugar-free whipped topping. Carbohydrate calculation Identify the foods that contain carbohydrates: Rice. Corn. Milk. Strawberries. Calculate how many servings you have of each food: 2 servings rice. 1 serving corn. 1 serving milk. 1 serving strawberries. Multiply each number of servings by 15 g: 2 servings rice x 15  g = 30 g. 1 serving corn x 15 g = 15 g. 1 serving milk x 15 g = 15 g. 1 serving strawberries x 15 g = 15 g. Add together all of the amounts to find the total grams of carbohydrates eaten: 30 g + 15 g + 15 g + 15 g = 75 g of carbohydrates total. What are tips for following this plan? Shopping Develop a meal plan and then make a shopping list. Buy fresh and frozen vegetables, fresh and frozen fruit, dairy, eggs, beans, lentils, and whole grains. Look at food labels. Choose foods that have more fiber and less sugar. Avoid processed foods and foods with added sugars. Meal planning Aim to have the same number of grams of carbohydrates at each meal and for each snack time. Plan to have regular, balanced meals and snacks. Where to find more information American Diabetes Association: diabetes.org Centers for Disease Control and Prevention: TonerPromos.no Academy of Nutrition and Dietetics: eatright.org Association of Diabetes Care & Education Specialists: diabeteseducator.org Summary Carbohydrate counting is a method of keeping track of how many carbohydrates you eat. Eating carbohydrates increases the amount of sugar (glucose) in your blood. Counting how many carbohydrates you eat improves how well you manage your blood glucose. This helps you manage your diabetes. A dietitian can help you make a meal plan and calculate how many carbohydrates you should have at each meal and snack. This information is not intended to replace advice given to you by your health care provider. Make sure you discuss any questions you have with your health care provider. Document Revised: 01/23/2020 Document Reviewed: 01/23/2020 Elsevier Patient Education  2024 [employer] DASH Eating Plan DASH stands for Dietary Approaches to Stop Hypertension. The DASH eating plan is a healthy eating plan that has been shown to: Lower high blood pressure (hypertension). Reduce your risk for type 2 diabetes, heart disease, and  stroke. Help with weight loss. What are tips for following this plan? Reading food labels Check food labels for the amount of salt (sodium) per serving. Choose foods with less than 5 percent of the Daily Value (DV) of sodium. In general, foods with less than 300 milligrams (mg) of sodium per serving fit into this eating plan. To find whole grains, look for the word "whole" as the first word in the ingredient list. Shopping Buy products labeled as "low-sodium" or "no salt added." Buy fresh foods. Avoid canned foods and pre-made or frozen meals. Cooking Try not to add salt when you cook. Use salt-free seasonings or herbs instead of table salt or sea salt. Check with your health care provider or pharmacist before using salt substitutes. Do not fry foods. Cook foods in healthy ways, such as baking, boiling, grilling, roasting, or broiling. Cook using oils that are good for your heart. These include olive, canola, avocado, soybean, and sunflower oil. Meal planning  Eat a balanced diet. This should include: 4 or more servings of  fruits and 4 or more servings of vegetables each day. Try to fill half of your plate with fruits and vegetables. 6-8 servings of whole grains each day. 6 or less servings of lean meat, poultry, or fish each day. 1 oz is 1 serving. A 3 oz (85 g) serving of meat is about the same size as the palm of your hand. One egg is 1 oz (28 g). 2-3 servings of low-fat dairy each day. One serving is 1 cup (237 mL). 1 serving of nuts, seeds, or beans 5 times each week. 2-3 servings of heart-healthy fats. Healthy fats called omega-3 fatty acids are found in foods such as walnuts, flaxseeds, fortified milks, and eggs. These fats are also found in cold-water fish, such as sardines, salmon, and mackerel. Limit how much you eat of: Canned or prepackaged foods. Food that is high in trans fat, such as fried foods. Food that is high in saturated fat, such as fatty meat. Desserts and other  sweets, sugary drinks, and other foods with added sugar. Full-fat dairy products. Do not salt foods before eating. Do not eat more than 4 egg yolks a week. Try to eat at least 2 vegetarian meals a week. Eat more home-cooked food and less restaurant, buffet, and fast food. Lifestyle When eating at a restaurant, ask if your food can be made with less salt or no salt. If you drink alcohol: Limit how much you have to: 0-1 drink a day if you are female. 0-2 drinks a day if you are female. Know how much alcohol is in your drink. In the [place] one drink is one 12 oz bottle of beer (355 mL), one 5 oz glass of wine (148 mL), or one 1 oz glass of hard liquor (44 mL). General information Avoid eating more than 2,300 mg of salt a day. If you have hypertension, you may need to reduce your sodium intake to 1,500 mg a day. Work with your provider to stay at a healthy body weight or lose weight. Ask what the best weight range is for you. On most days of the week, get at least 30 minutes of exercise that causes your heart to beat faster. This may include walking, swimming, or biking. Work with your provider or dietitian to adjust your eating plan to meet your specific calorie needs. What foods should I eat? Fruits All fresh, dried, or frozen fruit. Canned fruits that are in their natural juice and do not have sugar added to them. Vegetables Fresh or frozen vegetables that are raw, steamed, roasted, or grilled. Low-sodium or reduced-sodium tomato and vegetable juice. Low-sodium or reduced-sodium tomato sauce and tomato paste. Low-sodium or reduced-sodium canned vegetables. Grains Whole-grain or whole-wheat bread. Whole-grain or whole-wheat pasta. Brown rice. Orpah Cobb. Bulgur. Whole-grain and low-sodium cereals. Pita bread. Low-fat, low-sodium crackers. Whole-wheat flour tortillas. Meats and other proteins Skinless chicken or [place]. Ground chicken or [place]. Pork with fat trimmed off. Fish and seafood.  Egg whites. Dried beans, peas, or lentils. Unsalted nuts, nut butters, and seeds. Unsalted canned beans. Lean cuts of beef with fat trimmed off. Low-sodium, lean precooked or cured meat, such as sausages or meat loaves. Dairy Low-fat (1%) or fat-free (skim) milk. Reduced-fat, low-fat, or fat-free cheeses. Nonfat, low-sodium ricotta or cottage cheese. Low-fat or nonfat yogurt. Low-fat, low-sodium cheese. Fats and oils Soft margarine without trans fats. Vegetable oil. Reduced-fat, low-fat, or light mayonnaise and salad dressings (reduced-sodium). Canola, safflower, olive, avocado, soybean, and sunflower oils. Avocado. Seasonings and condiments Herbs. Spices.  Seasoning mixes without salt. Other foods Unsalted popcorn and pretzels. Fat-free sweets. The items listed above may not be all the foods and drinks you can have. Talk to a dietitian to learn more. What foods should I avoid? Fruits Canned fruit in a light or heavy syrup. Fried fruit. Fruit in cream or butter sauce. Vegetables Creamed or fried vegetables. Vegetables in a cheese sauce. Regular canned vegetables that are not marked as low-sodium or reduced-sodium. Regular canned tomato sauce and paste that are not marked as low-sodium or reduced-sodium. Regular tomato and vegetable juices that are not marked as low-sodium or reduced-sodium. Rosita Fire. Olives. Grains Baked goods made with fat, such as croissants, muffins, or some breads. Dry pasta or rice meal packs. Meats and other proteins Fatty cuts of meat. Ribs. Fried meat. Tomasa Blase. Bologna, salami, and other precooked or cured meats, such as sausages or meat loaves, that are not lean and low in sodium. Fat from the back of a pig (fatback). Bratwurst. Salted nuts and seeds. Canned beans with added salt. Canned or smoked fish. Whole eggs or egg yolks. Chicken or [place] with skin. Dairy Whole or 2% milk, cream, and half-and-half. Whole or full-fat cream cheese. Whole-fat or sweetened yogurt.  Full-fat cheese. Nondairy creamers. Whipped toppings. Processed cheese and cheese spreads. Fats and oils Butter. Stick margarine. Lard. Shortening. Ghee. Bacon fat. Tropical oils, such as coconut, palm kernel, or palm oil. Seasonings and condiments Onion salt, garlic salt, seasoned salt, table salt, and sea salt. Worcestershire sauce. Tartar sauce. Barbecue sauce. Teriyaki sauce. Soy sauce, including reduced-sodium soy sauce. Steak sauce. Canned and packaged gravies. Fish sauce. Oyster sauce. Cocktail sauce. Store-bought horseradish. Ketchup. Mustard. Meat flavorings and tenderizers. Bouillon cubes. Hot sauces. Pre-made or packaged marinades. Pre-made or packaged taco seasonings. Relishes. Regular salad dressings. Other foods Salted popcorn and pretzels. The items listed above may not be all the foods and drinks you should avoid. Talk to a dietitian to learn more. Where to find more information National Heart, Lung, and Blood Institute (NHLBI): BuffaloDryCleaner.gl American Heart Association (AHA): heart.org Academy of Nutrition and Dietetics: eatright.org National Kidney Foundation (NKF): kidney.org This information is not intended to replace advice given to you by your health care provider. Make sure you discuss any questions you have with your health care provider. Document Revised: 07/08/2022 Document Reviewed: 07/08/2022 Elsevier Patient Education  2024 [employer].

## 2023-03-16 NOTE — Progress Notes (Unsigned)
Established Patient Office Visit  Subjective   Patient ID: Lindsey Benjamin, female    DOB: [birth year]  Age: 57 y.o. MRN: [id number]  No chief complaint on file.   HPI 57 y.o. female with past medical history significant for severe morbid obesity, severe OSA requiring BiPAP nightly at baseline, hypertension, type 2 diabetes, chronic hypoxic respiratory failure on 3 L oxygen supplementation continuously at baseline presents for routine follow up visit and lab review.  Her last HgbA1c checked on 03/04/23 was 7.3%. It was previously 7.5%. She is compliant with her medications, denies side effects and continues to make healthy lifestyle changes. She checks her blood gluocse qid, her fasting reading was 111 mg/dl during visit. She denies hypo/hyperglycemic symptoms, peripheral neuropathy is controlled with taking gabapentin and she performs daily foot checks. 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 No evidence of tamponade.  CRP 3.2 elevated.  TSH also elevated >15.  Pericardial effusion may be secondary to uncontrolled hypothyroidism versus others.  Cardiology recommended to continue steroids, and added colchicine. She was  seen at [facility] clinic  for evaluation of COPD,She does not show obstruction but is certainly at risk given her smoking hx. She is encouraged to continue to follow up  with neurology clinic for her OSA  Overall, she states that she's doing well and offers no further complaint.    Patient Active Problem List   Diagnosis Date Noted   Pericardial effusion 03/06/2023   Acute hypercapnic respiratory  failure (HCC) 03/06/2023   Acute on chronic heart failure with preserved ejection fraction (HCC) 03/06/2023   Acute on chronic respiratory failure with hypoxia and hypercapnia (HCC) 03/03/2023   Asthma 07/15/2021   Type 2 diabetes mellitus without complication, without long-term current use of insulin (HCC) 03/04/2021   Leg swelling 01/28/2021   Hyperlipidemia 11/05/2020   Mental health problem 06/18/2020   Pain due to onychomycosis of toenails of both feet 04/30/2019   Pincer nail deformity 04/30/2019   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   Hepatic steatosis 10/19/2017   Sinusitis 01/11/2017   Essential hypertension 07/23/2015   Sleep apnea 07/23/2015   Hypothyroidism 07/23/2015   Past Medical History:  Diagnosis Date   (HFpEF) heart failure with preserved ejection fraction (HCC)    Asthma    Chronic hypoxic respiratory failure, on home oxygen therapy (HCC)    6L [place]   COPD (chronic obstructive pulmonary disease) (HCC)    Hypertension    Hypothyroidism    Morbid obesity with BMI of 40.0-44.9, adult (HCC)    Noncompliance with medication regimen    OSA treated with BiPAP    Smoker    Type 2 diabetes mellitus without complication, without long-term current use of insulin (HCC) 03/04/2021   Past Surgical History:  Procedure Laterality Date   CARDIAC CATHETERIZATION  11/2020   CESAREAN SECTION     CHOLECYSTECTOMY     FOOT SURGERY     INNER EAR SURGERY     tubes in ears and repair a hole in the eardrum   Social History   Tobacco Use   Smoking status: Every Day    Current packs/day: 0.50    Average packs/day: 0.5 packs/day for 20.0  years (10.0 ttl pk-yrs)    Types: Cigarettes   Smokeless tobacco: Never  Vaping Use   Vaping status: Never Used  Substance Use Topics   Alcohol use: Not Currently    Alcohol/week: 0.0 - 1.0 standard drinks of alcohol    Comment: occasionally   Drug use: No   Social History   Socioeconomic History   Marital status: Married     Spouse name: Not on file   Number of children: Not on file   Years of education: Not on file   Highest education level: Not on file  Occupational History   Not on file  Tobacco Use   Smoking status: Every Day    Current packs/day: 0.50    Average packs/day: 0.5 packs/day for 20.0 years (10.0 ttl pk-yrs)    Types: Cigarettes   Smokeless tobacco: Never  Vaping Use   Vaping status: Never Used  Substance and Sexual Activity   Alcohol use: Not Currently    Alcohol/week: 0.0 - 1.0 standard drinks of alcohol    Comment: occasionally   Drug use: No   Sexual activity: Not on file  Other Topics Concern   Not on file  Social History Narrative   Is on food stamps but they will be cut in half starting next month. She is not worried about this       Lives in apartment      Pt said she is doing well and Is not currently interested in potential aid    Social Determinants of Health   Financial Resource Strain: Low Risk  (02/01/2018)   Overall Financial Resource Strain (CARDIA)    Difficulty of Paying Living Expenses: Not hard at all  Food Insecurity: No Food Insecurity (03/05/2023)   Hunger Vital Sign    Worried About Running Out of Food in the Last Year: Never true    Ran Out of Food in the Last Year: Never true  Transportation Needs: No Transportation Needs (03/05/2023)   PRAPARE - Administrator, Civil Service (Medical): No    Lack of Transportation (Non-Medical): No  Physical Activity: Inactive (02/01/2018)   Exercise Vital Sign    Days of Exercise per Week: 0 days    Minutes of Exercise per Session: 0 min  Stress: No Stress Concern Present (02/01/2018)   [employer] of Occupational Health - Occupational Stress Questionnaire    Feeling of Stress : Not at all  Social Connections: Moderately Integrated (02/01/2018)   Social Connection and Isolation Panel [NHANES]    Frequency of Communication with Friends and Family: More than three times a week    Frequency of Social  Gatherings with Friends and Family: Never    Attends Religious Services: More than 4 times per year    Active Member of [employer] or Organizations: No    Attends Banker Meetings: Never    Marital Status: Married  Catering manager Violence: Not At Risk (03/05/2023)   Humiliation, Afraid, Rape, and Kick questionnaire    Fear of Current or Ex-Partner: No    Emotionally Abused: No    Physically Abused: No    Sexually Abused: No   Family Status  Relation Name Status   Mother  Deceased   Father  Deceased   Brother  Alive   Brother  Deceased   Neg Hx  (Not Specified)  No partnership data on file   Family History  Problem Relation Age of Onset   Cushing syndrome Mother  Thyroid disease Mother    Heart attack Father 32   Hyperlipidemia Brother    Thyroid disease Brother    Hypertension Brother    Breast cancer Neg Hx    Allergies  Allergen Reactions   Ceclor [Cefaclor] Hives   Levofloxacin Rash   Lisinopril Cough   Ofloxacin Swelling   Penicillins Anaphylaxis   Ranitidine Hives   Tomato Rash      Review of Systems  Constitutional: Negative.   HENT: Negative.    Eyes: Negative.   Respiratory: Negative.    Cardiovascular:  Positive for leg swelling.  Gastrointestinal: Negative.   Musculoskeletal: Negative.   Skin: Negative.   Neurological: Negative.   Endo/Heme/Allergies: Negative.   Psychiatric/Behavioral: Negative.        Objective:     LMP 07/15/2016 (Approximate)  BP Readings from Last 3 Encounters:  03/10/23 (!) 138/90  03/08/23 (!) 177/95  03/03/23 (!) 220/120   Wt Readings from Last 3 Encounters:  03/08/23 193 lb 12.6 oz (87.9 kg)  08/04/22 210 lb 11.2 oz (95.6 kg)  05/26/22 199 lb 1.2 oz (90.3 kg)      Physical Exam Constitutional:      Appearance: Normal appearance.  HENT:     Head: Normocephalic.     Nose: Nose normal.     Mouth/Throat:     Mouth: Mucous membranes are moist.     Pharynx: Oropharynx is clear.  Eyes:      Pupils: Pupils are equal, round, and reactive to light.  Cardiovascular:     Rate and Rhythm: Normal rate.     Pulses: Normal pulses.     Heart sounds: Normal heart sounds.  Abdominal:     General: Bowel sounds are normal.  Musculoskeletal:     Left lower leg: Edema present.  Skin:    General: Skin is warm and dry.  Neurological:     General: No focal deficit present.     Mental Status: She is alert and oriented to person, place, and time.  Psychiatric:        Mood and Affect: Mood normal.        Behavior: Behavior normal.        Thought Content: Thought content normal.        Judgment: Judgment normal.     No results found for any visits on 03/16/23.  Last CBC Lab Results  Component Value Date   WBC 8.9 03/08/2023   HGB 16.2 (H) 03/08/2023   HCT 52.7 (H) 03/08/2023   MCV 84.3 03/08/2023   MCH 25.9 (L) 03/08/2023   RDW 18.1 (H) 03/08/2023   PLT 173 03/08/2023   Last metabolic panel Lab Results  Component Value Date   GLUCOSE 100 (H) 03/08/2023   NA 137 03/08/2023   K 3.4 (L) 03/08/2023   CL 87 (L) 03/08/2023   CO2 37 (H) 03/08/2023   BUN 27 (H) 03/08/2023   CREATININE 0.97 03/08/2023   GFRNONAA >60 03/08/2023   CALCIUM 8.6 (L) 03/08/2023   PHOS 3.7 03/08/2023   PROT 5.5 (L) 03/04/2023   ALBUMIN 2.9 (L) 03/08/2023   LABGLOB 3.0 11/04/2021   AGRATIO 1.5 11/04/2021   BILITOT 0.9 03/04/2023   ALKPHOS 46 03/04/2023   AST 34 03/04/2023   ALT 19 03/04/2023   ANIONGAP 13 03/08/2023   Last lipids Lab Results  Component Value Date   CHOL 195 06/10/2021   HDL 39 (L) 06/10/2021   LDLCALC 108 (H) 06/10/2021   TRIG 277 (H) 06/10/2021  CHOLHDL 5.0 (H) 06/10/2021   Last hemoglobin A1c Lab Results  Component Value Date   HGBA1C 7.3 (H) 03/04/2023   Last thyroid functions Lab Results  Component Value Date   TSH 15.300 (H) 03/04/2023   T4TOTAL 2.0 (L) 03/04/2023   Last vitamin D Lab Results  Component Value Date   VD25OH 14.7 (L) 08/04/2022   Last  vitamin B12 and Folate No results found for: "VITAMINB12", "FOLATE"    The 10-year ASCVD risk score (Arnett DK, et al., 2019) is: 22.5%    Assessment & Plan:  1. Hypothyroidism, unspecified type *** - levothyroxine (SYNTHROID) 175 MCG tablet; Take 1 tablet (175 mcg total) by mouth daily before breakfast.  Dispense: 90 tablet; Refill: 0 - VITAMIN D 25 Hydroxy (Vit-D Deficiency, Fractures); Future  2. Type 2 diabetes mellitus without complication, without long-term current use of insulin (HCC) Her Diabetes is improving, HgbA1c was 7.3% and her goal should be less than 7%. She will continue on current medication, low carb/non concentrated sweet diet and exercise as tolerated.  - POCT Glucose (CBG); Future - glipiZIDE (GLUCOTROL) 5 MG tablet; Take 1 tablet (5 mg total) by mouth daily before breakfast.  Dispense: 30 tablet; Refill: 0  3. Essential hypertension  Her blood pressure is not under control, her goal should be less than 157/94, she will continue on current medication, DASH diet and exercise as tolerated. She was encouraged to follow up with Cardiologist.   - losartan (COZAAR) 100 MG tablet; Take 1 tablet (100 mg total) by mouth daily.  Dispense: 90 tablet; Refill: 0 - Basic Metabolic Panel (BMET)  4. Hyperlipidemia, unspecified hyperlipidemia type The 10-year ASCVD risk score (Arnett DK, et al., 2019) is: 28.1%   Values used to calculate the score:     Age: 77 years     Sex: Female     Is Non-Hispanic African American: No     Diabetic: Yes     Tobacco smoker: Yes     Systolic Blood Pressure: 157 mmHg     Is BP treated: Yes     HDL Cholesterol: 39 mg/dL     Total Cholesterol: 195 mg/dL  - pravastatin (PRAVACHOL) 40 MG tablet; Take 1 tablet (40 mg total) by mouth daily at 6 PM.  Dispense: 90 tablet; Refill: 0  5. History of gastroesophageal reflux (GERD) *** - pantoprazole (PROTONIX) 40 MG tablet; Take 1 tablet (40 mg total) by mouth at bedtime.  Dispense: 30 tablet;  Refill: 0  6. Leg swelling ***    No follow-ups on file.    Lutricia Horsfall, RN

## 2023-03-17 ENCOUNTER — Other Ambulatory Visit: Payer: Self-pay

## 2023-03-17 ENCOUNTER — Other Ambulatory Visit: Payer: Self-pay | Admitting: Gerontology

## 2023-03-17 DIAGNOSIS — E559 Vitamin D deficiency, unspecified: Secondary | ICD-10-CM

## 2023-03-17 LAB — BASIC METABOLIC PANEL
BUN/Creatinine Ratio: 9 (ref 9–23)
BUN: 7 mg/dL (ref 6–24)
CO2: 31 mmol/L — ABNORMAL HIGH (ref 20–29)
Calcium: 9 mg/dL (ref 8.7–10.2)
Chloride: 96 mmol/L (ref 96–106)
Creatinine, Ser: 0.81 mg/dL (ref 0.57–1.00)
Glucose: 122 mg/dL — ABNORMAL HIGH (ref 70–99)
Potassium: 4.3 mmol/L (ref 3.5–5.2)
Sodium: 140 mmol/L (ref 134–144)
eGFR: 84 mL/min/{1.73_m2} (ref >59)

## 2023-03-17 LAB — VITAMIN D 25 HYDROXY (VIT D DEFICIENCY, FRACTURES): Vit D, 25-Hydroxy: 21 ng/mL — ABNORMAL LOW (ref 30.0–100.0)

## 2023-03-17 MED ORDER — VITAMIN D (ERGOCALCIFEROL) 1.25 MG (50000 UNIT) PO CAPS
50000.0000 [IU] | ORAL_CAPSULE | ORAL | 0 refills | Status: DC
Start: 2023-03-17 — End: 2023-04-20
  Filled 2023-03-17: qty 12, 84d supply, fill #0

## 2023-04-20 ENCOUNTER — Other Ambulatory Visit: Payer: Self-pay

## 2023-04-20 ENCOUNTER — Ambulatory Visit: Payer: Self-pay | Admitting: Gerontology

## 2023-04-20 ENCOUNTER — Encounter: Payer: Self-pay | Admitting: Gerontology

## 2023-04-20 VITALS — BP 154/97 | HR 72 | Ht <= 58 in | Wt 200.5 lb

## 2023-04-20 DIAGNOSIS — E119 Type 2 diabetes mellitus without complications: Secondary | ICD-10-CM

## 2023-04-20 DIAGNOSIS — Z8719 Personal history of other diseases of the digestive system: Secondary | ICD-10-CM

## 2023-04-20 DIAGNOSIS — J449 Chronic obstructive pulmonary disease, unspecified: Secondary | ICD-10-CM | POA: Insufficient documentation

## 2023-04-20 DIAGNOSIS — E785 Hyperlipidemia, unspecified: Secondary | ICD-10-CM

## 2023-04-20 DIAGNOSIS — E559 Vitamin D deficiency, unspecified: Secondary | ICD-10-CM

## 2023-04-20 DIAGNOSIS — I10 Essential (primary) hypertension: Secondary | ICD-10-CM

## 2023-04-20 DIAGNOSIS — Z23 Encounter for immunization: Secondary | ICD-10-CM

## 2023-04-20 DIAGNOSIS — E039 Hypothyroidism, unspecified: Secondary | ICD-10-CM

## 2023-04-20 DIAGNOSIS — M79605 Pain in left leg: Secondary | ICD-10-CM | POA: Insufficient documentation

## 2023-04-20 LAB — GLUCOSE, POCT (MANUAL RESULT ENTRY)
POC Glucose: 58 mg/dL — AB (ref 70–99)
POC Glucose: 74 mg/dL (ref 70–99)

## 2023-04-20 MED ORDER — LEVOTHYROXINE SODIUM 175 MCG PO TABS
175.0000 ug | ORAL_TABLET | Freq: Every day | ORAL | 0 refills | Status: DC
Start: 2023-04-20 — End: 2023-05-11
  Filled 2023-04-20: qty 90, 90d supply, fill #0

## 2023-04-20 MED ORDER — PANTOPRAZOLE SODIUM 40 MG PO TBEC
40.0000 mg | DELAYED_RELEASE_TABLET | Freq: Every day | ORAL | 1 refills | Status: AC
  Filled 2023-04-20: qty 30, 30d supply, fill #0

## 2023-04-20 MED ORDER — GLIPIZIDE 5 MG PO TABS
5.0000 mg | ORAL_TABLET | Freq: Every day | ORAL | 2 refills | Status: DC
  Filled 2023-04-20: qty 30, 30d supply, fill #0

## 2023-04-20 MED ORDER — LOSARTAN POTASSIUM 100 MG PO TABS
100.0000 mg | ORAL_TABLET | Freq: Every day | ORAL | 0 refills | Status: DC
  Filled 2023-04-20: qty 90, 90d supply, fill #0

## 2023-04-20 MED ORDER — AMLODIPINE BESYLATE 2.5 MG PO TABS
2.5000 mg | ORAL_TABLET | Freq: Every day | ORAL | 0 refills | Status: DC
  Filled 2023-04-20: qty 90, 90d supply, fill #0

## 2023-04-20 MED ORDER — SPIRONOLACTONE 25 MG PO TABS
25.0000 mg | ORAL_TABLET | Freq: Every day | ORAL | 0 refills | Status: DC
  Filled 2023-04-20: qty 30, 30d supply, fill #0

## 2023-04-20 MED ORDER — ALBUTEROL SULFATE HFA 108 (90 BASE) MCG/ACT IN AERS
2.0000 | INHALATION_SPRAY | Freq: Four times a day (QID) | RESPIRATORY_TRACT | 3 refills | Status: AC
  Filled 2023-04-20: qty 6.7, 25d supply, fill #0

## 2023-04-20 MED ORDER — PRAVASTATIN SODIUM 40 MG PO TABS
40.0000 mg | ORAL_TABLET | Freq: Every day | ORAL | 0 refills | Status: DC
  Filled 2023-04-20: qty 90, 90d supply, fill #0

## 2023-04-20 MED ORDER — TRUE METRIX METER W/DEVICE KIT
PACK | 0 refills | Status: AC
Start: 2023-04-20 — End: ?
  Filled 2023-04-20: qty 1, fill #0
  Filled 2023-04-20: qty 1, 30d supply, fill #0

## 2023-04-20 NOTE — Patient Instructions (Signed)

## 2023-04-20 NOTE — Progress Notes (Unsigned)
Established Patient Office Visit  Subjective   Patient ID: Lindsey Benjamin, female    DOB: 05-Feb-1966  Age: 57 y.o. MRN: [id number]  Chief Complaint  Patient presents with   Follow-up    HPI  Lindsey Benjamin is a 57 y.o. female with past medical history significant for severe morbid obesity, severe OSA requiring BiPAP nightly at baseline, hypertension, type 2 diabetes, chronic hypoxic respiratory failure on 3 L oxygen supplementation continuously at baseline presents for routine follow up visit and lab review.  She reports using oxygen 3L of oxygen at night with her BIPAP. She states that he breathing is stable, she denies wheezing, chest tightness and will go for sleep study at [facility] next month. She was seen at [facility] on ***. She states that she misplaced her glucometer, and has not checked her blood glucose in 1 month, but states that she's conplinat with her medication, denies any side effects. She c/o constant non radiating, non traumatic pain to left calf pain that started 3 days ago. She describes pain as bruising sensation and pian is a 5/10. She states that walking or standing aggravates symptoms and sitting minimaly relieves symptoms. She states that left calf is swollen, and tight but denies erythema.   Patient Active Problem List   Diagnosis Date Noted   Chronic obstructive pulmonary disease (COPD) (HCC) 04/20/2023   Left leg pain 04/20/2023   Pericardial effusion 03/06/2023   Acute hypercapnic respiratory failure (HCC) 03/06/2023   Acute on chronic heart failure with preserved ejection fraction (HCC) 03/06/2023   Acute on chronic respiratory failure with hypoxia and hypercapnia (HCC) 03/03/2023   Asthma 07/15/2021   Type 2 diabetes mellitus without complication, without long-term current use of insulin (HCC) 03/04/2021   Leg swelling 01/28/2021   Hyperlipidemia 11/05/2020   Mental health problem 06/18/2020   Pain due to onychomycosis of toenails of both feet  04/30/2019   Pincer nail deformity 04/30/2019   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   Hepatic steatosis 10/19/2017   Sinusitis 01/11/2017   Essential hypertension 07/23/2015   Sleep apnea 07/23/2015   Hypothyroidism 07/23/2015   Past Medical History:  Diagnosis Date   (HFpEF) heart failure with preserved ejection fraction (HCC)    Asthma    Chronic hypoxic respiratory failure, on home oxygen therapy (HCC)    6L [place]   COPD (chronic obstructive pulmonary disease) (HCC)    Hypertension    Hypothyroidism    Morbid obesity with BMI of 40.0-44.9, adult (HCC)    Noncompliance with medication regimen    OSA treated with BiPAP    Smoker    Type 2 diabetes mellitus without complication, without long-term current use of insulin (HCC) 03/04/2021   Past Surgical History:  Procedure Laterality Date   CARDIAC CATHETERIZATION  11/2020   CESAREAN SECTION     CHOLECYSTECTOMY     FOOT SURGERY     INNER EAR SURGERY     tubes in ears and repair a hole in the eardrum   Social History   Tobacco Use   Smoking status: Every Day    Current packs/day: 0.50    Average packs/day: 0.5 packs/day for 20.0 years (10.0 ttl pk-yrs)    Types: Cigarettes   Smokeless tobacco: Never  Vaping Use   Vaping status: Never Used  Substance Use Topics   Alcohol use: Not Currently    Alcohol/week: 0.0 - 1.0 standard drinks of alcohol    Comment: occasionally   Drug use: No  Family History  Problem Relation Age of Onset   Cushing syndrome Mother    Thyroid disease Mother    Heart attack Father 45   Hyperlipidemia Brother    Thyroid disease Brother    Hypertension Brother    Breast cancer Neg Hx    Allergies  Allergen Reactions   Ceclor [Cefaclor] Hives   Levofloxacin Rash   Lisinopril Cough   Ofloxacin Swelling   Penicillins Anaphylaxis   Ranitidine Hives   Tomato Rash      Review of Systems  Constitutional: Negative.   Respiratory: Negative.    Cardiovascular:  Positive for  claudication (to left calf).  Gastrointestinal: Negative.   Genitourinary: Negative.   Neurological: Negative.   Endo/Heme/Allergies: Negative.   Psychiatric/Behavioral: Negative.        Objective:     BP (!) 154/97 (BP Location: Right Arm, Patient Position: Sitting, Cuff Size: Large)   Pulse 72   Ht 4\' 9"  (1.448 m)   Wt 200 lb 8 oz (90.9 kg)   LMP 07/15/2016 (Approximate)   SpO2 92%   BMI 43.39 kg/m  BP Readings from Last 3 Encounters:  04/20/23 (!) 154/97  03/16/23 (!) 157/94  03/10/23 (!) 138/90   Wt Readings from Last 3 Encounters:  04/20/23 200 lb 8 oz (90.9 kg)  03/16/23 208 lb (94.3 kg)  03/08/23 193 lb 12.6 oz (87.9 kg)      Physical Exam   No results found for any visits on 04/20/23.  Last CBC Lab Results  Component Value Date   WBC 8.9 03/08/2023   HGB 16.2 (H) 03/08/2023   HCT 52.7 (H) 03/08/2023   MCV 84.3 03/08/2023   MCH 25.9 (L) 03/08/2023   RDW 18.1 (H) 03/08/2023   PLT 173 03/08/2023   Last metabolic panel Lab Results  Component Value Date   GLUCOSE 122 (H) 03/16/2023   NA 140 03/16/2023   K 4.3 03/16/2023   CL 96 03/16/2023   CO2 31 (H) 03/16/2023   BUN 7 03/16/2023   CREATININE 0.81 03/16/2023   EGFR 84 03/16/2023   CALCIUM 9.0 03/16/2023   PHOS 3.7 03/08/2023   PROT 5.5 (L) 03/04/2023   ALBUMIN 2.9 (L) 03/08/2023   LABGLOB 3.0 11/04/2021   AGRATIO 1.5 11/04/2021   BILITOT 0.9 03/04/2023   ALKPHOS 46 03/04/2023   AST 34 03/04/2023   ALT 19 03/04/2023   ANIONGAP 13 03/08/2023   Last lipids Lab Results  Component Value Date   CHOL 195 06/10/2021   HDL 39 (L) 06/10/2021   LDLCALC 108 (H) 06/10/2021   TRIG 277 (H) 06/10/2021   CHOLHDL 5.0 (H) 06/10/2021   Last hemoglobin A1c Lab Results  Component Value Date   HGBA1C 7.3 (H) 03/04/2023   Last thyroid functions Lab Results  Component Value Date   TSH 15.300 (H) 03/04/2023   T4TOTAL 2.0 (L) 03/04/2023   Last vitamin D Lab Results  Component Value Date   VD25OH  21.0 (L) 03/16/2023   Last vitamin B12 and Folate No results found for: "VITAMINB12", "FOLATE"    The 10-year ASCVD risk score (Arnett DK, et al., 2019) is: 27.2%    Assessment & Plan:   Problem List Items Addressed This Visit       Cardiovascular and Mediastinum   Essential hypertension   Relevant Medications   amLODipine (NORVASC) 2.5 MG tablet   losartan (COZAAR) 100 MG tablet   pravastatin (PRAVACHOL) 40 MG tablet   spironolactone (ALDACTONE) 25 MG tablet  Respiratory   Chronic obstructive pulmonary disease (COPD) (HCC)   Relevant Medications   albuterol (VENTOLIN HFA) 108 (90 Base) MCG/ACT inhaler     Endocrine   Hypothyroidism   Relevant Medications   levothyroxine (SYNTHROID) 175 MCG tablet   Other Relevant Orders   T4, free   TSH   Type 2 diabetes mellitus without complication, without long-term current use of insulin (HCC) - Primary   Relevant Medications   Blood Glucose Monitoring Suppl DEVI   glipiZIDE (GLUCOTROL) 5 MG tablet   losartan (COZAAR) 100 MG tablet   pravastatin (PRAVACHOL) 40 MG tablet   Other Relevant Orders   POCT Glucose (CBG)   Comp Met (CMET)     Other   Hyperlipidemia   Relevant Medications   amLODipine (NORVASC) 2.5 MG tablet   losartan (COZAAR) 100 MG tablet   pravastatin (PRAVACHOL) 40 MG tablet   spironolactone (ALDACTONE) 25 MG tablet   Other Relevant Orders   Lipid panel   Left leg pain   Relevant Orders   US Venous Img Lower Unilateral Left   Other Visit Diagnoses     Vitamin D deficiency       Relevant Orders   VITAMIN D 25 Hydroxy (Vit-D Deficiency, Fractures)   History of gastroesophageal reflux (GERD)       Relevant Medications   pantoprazole (PROTONIX) 40 MG tablet       No follow-ups on file.    Brenton Joines Trellis Paganini, NP

## 2023-04-21 ENCOUNTER — Other Ambulatory Visit: Payer: Self-pay

## 2023-04-22 ENCOUNTER — Other Ambulatory Visit: Payer: Self-pay

## 2023-04-26 ENCOUNTER — Other Ambulatory Visit: Payer: Self-pay | Admitting: Gerontology

## 2023-04-26 ENCOUNTER — Other Ambulatory Visit: Payer: Self-pay

## 2023-04-26 DIAGNOSIS — E119 Type 2 diabetes mellitus without complications: Secondary | ICD-10-CM

## 2023-04-26 MED ORDER — RIGHTEST GS550 BLOOD GLUCOSE VI STRP
ORAL_STRIP | 2 refills | Status: AC
  Filled 2023-04-26: qty 100, 25d supply, fill #0

## 2023-04-26 MED ORDER — RIGHTEST GL300 LANCETS MISC
2 refills | Status: AC
  Filled 2023-04-26: qty 100, 25d supply, fill #0

## 2023-04-26 NOTE — Congregational Nurse Program (Signed)
  Dept: [phone]   Congregational Nurse Program Note  Date of Encounter: 04/26/2023 Client to [facility] nurse led clinic for assistance in looking over her mail regarding her medicaid. She has been approved and will have [employer]. She wanted to be sure that [facility] was aware of her approval. RN was able to advise the [facility] financial navigator. Cleint also requested assistance in finding a new PCP. She is currently being seen at [facility], but after her next apt in November, she will be unable to attend that clinic. Client requested RN to contact [facility]. Per receptionist at [facility], the physician that is taking new patient's will review client's records and contact Rn if patient is accepted. Next new patient apt will not be available until Feb 2025. Client reports she does currently have enough of her medications. RN also educated client on her Medicaid transportation benefit and offered assistance as needed. Client is no longer on continuous oxygen, does have a BIPAP for night time, but has not used it. She reports that the oxygen company had provided her the education she needed on how to use it. RN encouraged client to return to clinic with any further needs. She does have an upcoming sleep study at [facility], where she has had charitable care. She will contact [facility] to advise of her Medicaid approval. [name] BSN, RN Past Medical History: Past Medical History:  Diagnosis Date   (HFpEF) heart failure with preserved ejection fraction (HCC)    Asthma    Chronic hypoxic respiratory failure, on home oxygen therapy (HCC)    6L [place]   COPD (chronic obstructive pulmonary disease) (HCC)    Hypertension    Hypothyroidism    Morbid obesity with BMI of 40.0-44.9, adult (HCC)    Noncompliance with medication regimen    OSA treated with BiPAP    Smoker    Type 2 diabetes mellitus without complication, without long-term current use of insulin (HCC) 03/04/2021     Encounter Details:  Community Questionnaire - 04/26/23 0945       Questionnaire   Ask client: Do you give verbal consent for me to treat you today? Yes    Student Assistance N/A    Location Patient Served  [facility]    Encounter Setting CN site    Population Status Unknown   client lives in an efficiancy apt   Insurance Medicaid   has charitable care through [facility]   Insurance/Financial Assistance Referral N/A   Medicaid application completed at visit and taken to Social Services today   Medication N/A    Medical Provider Yes   [facility] and several specilaist at [facility]   Screening Referrals Made N/A    Medical Referrals Made Non-[facility] PCP/Clinic    Medical Appointment Completed N/A    CNP Interventions Advocate/Support;Navigate Healthcare System;Educate;Reviewed Medications    Screenings CN Performed Blood Pressure    ED Visit Averted N/A    Life-Saving Intervention Made N/A

## 2023-05-11 ENCOUNTER — Encounter: Payer: Self-pay | Admitting: Gerontology

## 2023-05-11 ENCOUNTER — Other Ambulatory Visit: Payer: Self-pay

## 2023-05-11 ENCOUNTER — Ambulatory Visit: Payer: Medicaid Other | Admitting: Gerontology

## 2023-05-11 VITALS — BP 162/91 | HR 71 | Ht <= 58 in | Wt 202.9 lb

## 2023-05-11 DIAGNOSIS — E039 Hypothyroidism, unspecified: Secondary | ICD-10-CM

## 2023-05-11 DIAGNOSIS — E785 Hyperlipidemia, unspecified: Secondary | ICD-10-CM

## 2023-05-11 DIAGNOSIS — M7989 Other specified soft tissue disorders: Secondary | ICD-10-CM

## 2023-05-11 DIAGNOSIS — I10 Essential (primary) hypertension: Secondary | ICD-10-CM

## 2023-05-11 MED ORDER — AMLODIPINE BESYLATE 2.5 MG PO TABS
2.5000 mg | ORAL_TABLET | Freq: Every day | ORAL | 0 refills | Status: DC
  Filled 2023-05-11: qty 90, 90d supply, fill #0

## 2023-05-11 MED ORDER — LOSARTAN POTASSIUM 100 MG PO TABS
100.0000 mg | ORAL_TABLET | Freq: Every day | ORAL | 0 refills | Status: AC
  Filled 2023-05-11: qty 90, 90d supply, fill #0

## 2023-05-11 MED ORDER — PRAVASTATIN SODIUM 40 MG PO TABS
40.0000 mg | ORAL_TABLET | Freq: Every day | ORAL | 0 refills | Status: AC
  Filled 2023-05-11: qty 90, 90d supply, fill #0

## 2023-05-11 MED ORDER — SPIRONOLACTONE 25 MG PO TABS
25.0000 mg | ORAL_TABLET | Freq: Every day | ORAL | 0 refills | Status: AC
  Filled 2023-05-11: qty 30, 30d supply, fill #0

## 2023-05-11 MED ORDER — LEVOTHYROXINE SODIUM 175 MCG PO TABS
175.0000 ug | ORAL_TABLET | Freq: Every day | ORAL | 0 refills | Status: DC
  Filled 2023-05-11: qty 90, 90d supply, fill #0

## 2023-05-11 NOTE — Progress Notes (Signed)
Established Patient Office Visit  Subjective   Patient ID: Lindsey Benjamin, female    DOB: Nov 25, 1965  Age: 57 y.o. MRN: [id number]    HPI   Lindsey Benjamin is a 57 y.o. female with past medical history significant for severe morbid obesity, severe OSA requiring BiPAP nightly at baseline, hypertension, type 2 diabetes, chronic hypoxic respiratory failure on 3LPM oxygen supplementation at baseline presents for routine follow up visit . She denies pain or discomfort.  She reports continued fatigue and shortness of breath on exertion.  Her blood pressure in clinic was 162/91, states that she took her medication before coming to the clinic. She denies chest pain, palpitation, dizziness and vision changes. She continues to smoke 1 pack of cigarette daily and denies the desire to quit, states that she will continue to cut down. Her oxygen saturation ws 93% on room air.  Her blood glucose was 95 mg/dl when checked in office today.  She reports she monitors her blood pressure and blood glucose daily.  She states she is compliant with her medications, denies side effects, and continues to make healthy lifestyle changes.  Overall, she is doing well and offers no further complaints.     Patient Active Problem List   Diagnosis Date Noted   Chronic obstructive pulmonary disease (COPD) (HCC) 04/20/2023   Left leg pain 04/20/2023   Pericardial effusion 03/06/2023   Acute hypercapnic respiratory failure (HCC) 03/06/2023   Acute on chronic heart failure with preserved ejection fraction (HCC) 03/06/2023   Acute on chronic respiratory failure with hypoxia and hypercapnia (HCC) 03/03/2023   Asthma 07/15/2021   Type 2 diabetes mellitus without complication, without long-term current use of insulin (HCC) 03/04/2021   Leg swelling 01/28/2021   Hyperlipidemia 11/05/2020   Mental health problem 06/18/2020   Pain due to onychomycosis of toenails of both feet 04/30/2019   Pincer nail deformity 04/30/2019    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   Hepatic steatosis 10/19/2017   Sinusitis 01/11/2017   Essential hypertension 07/23/2015   Sleep apnea 07/23/2015   Hypothyroidism 07/23/2015   Past Medical History:  Diagnosis Date   (HFpEF) heart failure with preserved ejection fraction (HCC)    Asthma    Chronic hypoxic respiratory failure, on home oxygen therapy (HCC)    6L [place]   COPD (chronic obstructive pulmonary disease) (HCC)    Hypertension    Hypothyroidism    Morbid obesity with BMI of 40.0-44.9, adult (HCC)    Noncompliance with medication regimen    OSA treated with BiPAP    Smoker    Type 2 diabetes mellitus without complication, without long-term current use of insulin (HCC) 03/04/2021   Past Surgical History:  Procedure Laterality Date   CARDIAC CATHETERIZATION  11/2020   CESAREAN SECTION     CHOLECYSTECTOMY     FOOT SURGERY     INNER EAR SURGERY     tubes in ears and repair a hole in the eardrum   Family History  Problem Relation Age of Onset   Cushing syndrome Mother    Thyroid disease Mother    Heart attack Father 65   Hyperlipidemia Brother    Thyroid disease Brother    Hypertension Brother    Breast cancer Neg Hx    Allergies  Allergen Reactions   Ceclor [Cefaclor] Hives   Levofloxacin Rash   Lisinopril Cough   Ofloxacin Swelling   Penicillins Anaphylaxis   Ranitidine Hives   Tomato Rash      Review  of Systems  Constitutional:  Positive for malaise/fatigue.  HENT: Negative.  Negative for hearing loss.   Eyes: Negative.   Respiratory:  Positive for shortness of breath. Negative for cough.   Cardiovascular:  Positive for leg swelling.  Gastrointestinal: Negative.   Musculoskeletal:  Negative for back pain.  Skin: Negative.   Neurological: Negative.   Endo/Heme/Allergies: Negative.   Psychiatric/Behavioral: Negative.        Objective:     BP (!) 162/91 (BP Location: Left Arm, Patient Position: Sitting, Cuff Size: Large)   Pulse 71   Ht  4\' 9"  (1.448 m)   Wt 202 lb 14.4 oz (92 kg)   LMP 07/15/2016 (Approximate)   SpO2 93%   BMI 43.91 kg/m  BP Readings from Last 3 Encounters:  05/11/23 (!) 162/91  04/26/23 (!) 180/98  04/20/23 (!) 154/97   Wt Readings from Last 3 Encounters:  05/11/23 202 lb 14.4 oz (92 kg)  04/20/23 200 lb 8 oz (90.9 kg)  03/16/23 208 lb (94.3 kg)      Physical Exam HENT:     Head: Normocephalic and atraumatic.     Mouth/Throat:     Mouth: Mucous membranes are moist.  Eyes:     Pupils: Pupils are equal, round, and reactive to light.  Cardiovascular:     Rate and Rhythm: Normal rate and regular rhythm.     Pulses: Normal pulses.     Heart sounds: Normal heart sounds.  Pulmonary:     Effort: Pulmonary effort is normal.     Breath sounds: Normal breath sounds.  Musculoskeletal:        General: Swelling (+1 edema to BLE) present.  Skin:    General: Skin is warm.  Neurological:     General: No focal deficit present.     Mental Status: She is alert and oriented to person, place, and time.      No results found for any visits on 05/11/23.  Last CBC Lab Results  Component Value Date   WBC 8.9 03/08/2023   HGB 16.2 (H) 03/08/2023   HCT 52.7 (H) 03/08/2023   MCV 84.3 03/08/2023   MCH 25.9 (L) 03/08/2023   RDW 18.1 (H) 03/08/2023   PLT 173 03/08/2023   Last metabolic panel Lab Results  Component Value Date   GLUCOSE 122 (H) 03/16/2023   NA 140 03/16/2023   K 4.3 03/16/2023   CL 96 03/16/2023   CO2 31 (H) 03/16/2023   BUN 7 03/16/2023   CREATININE 0.81 03/16/2023   EGFR 84 03/16/2023   CALCIUM 9.0 03/16/2023   PHOS 3.7 03/08/2023   PROT 5.5 (L) 03/04/2023   ALBUMIN 2.9 (L) 03/08/2023   LABGLOB 3.0 11/04/2021   AGRATIO 1.5 11/04/2021   BILITOT 0.9 03/04/2023   ALKPHOS 46 03/04/2023   AST 34 03/04/2023   ALT 19 03/04/2023   ANIONGAP 13 03/08/2023   Last lipids Lab Results  Component Value Date   CHOL 195 06/10/2021   HDL 39 (L) 06/10/2021   LDLCALC 108 (H)  06/10/2021   TRIG 277 (H) 06/10/2021   CHOLHDL 5.0 (H) 06/10/2021   Last hemoglobin A1c Lab Results  Component Value Date   HGBA1C 7.3 (H) 03/04/2023   Last thyroid functions Lab Results  Component Value Date   TSH 15.300 (H) 03/04/2023   T4TOTAL 2.0 (L) 03/04/2023   Last vitamin D Lab Results  Component Value Date   VD25OH 21.0 (L) 03/16/2023   Last vitamin B12 and Folate No results found  for: "VITAMINB12", "FOLATE"    The 10-year ASCVD risk score (Arnett DK, et al., 2019) is: 29.6%    Assessment & Plan:  1. Essential hypertension -Her blood pressure remains uncontrolled.  She will continue her current medication.  She doesn't use her BIPAP at night and continues to smoke a pack a day. She was strongly encouraged on smoking cessation. She was encouraged to adhere to a DASH diet and participate in exercise as tolerated.  Advised her to keep her sleep study appointment with [facility]. - amLODipine (NORVASC) 2.5 MG tablet; Take 1 tablet (2.5 mg total) by mouth daily.  Dispense: 90 tablet; Refill: 0 - losartan (COZAAR) 100 MG tablet; Take 1 tablet (100 mg total) by mouth daily.  Dispense: 90 tablet; Refill: 0 - spironolactone (ALDACTONE) 25 MG tablet; Take 1 tablet (25 mg total) by mouth daily.  Dispense: 30 tablet; Refill: 0  2. Hypothyroidism, unspecified type -She will continue to take her medication as prescribed. She was encouraged to decrease stress and to exercise as tolerated.  - levothyroxine (SYNTHROID) 175 MCG tablet; Take 1 tablet (175 mcg total) by mouth daily before breakfast.  Dispense: 90 tablet; Refill: 0  3. Hyperlipidemia, unspecified hyperlipidemia type -She will continue to take her medication as prescribed.  She was encouraged to adhere to a low fat/cholesterol diet and exercise as tolerated.  - pravastatin (PRAVACHOL) 40 MG tablet; Take 1 tablet (40 mg total) by mouth daily at 6 PM.  Dispense: 90 tablet; Refill: 0   4. Leg swelling - She was encouraged to  wear compression stockings, elevate legs while sitting down and notify clinic and go to the ED for worsening swelling.   Today is Lindsey Benjamin's last visit because she now has Medicaid.  [facility] thanks her for having allowed [place] to provide care and wishes her well with her future care.      Chioma Trellis Paganini, NP

## 2023-05-12 NOTE — Congregational Nurse Program (Signed)
  Dept: [phone]   Congregational Nurse Program Note  Date of Encounter: 05/12/2023 Client to [facility] day center for assistance with several things she needed assistance with. 1. Appointment made for a new patient appointment at [facility] clinic with Dr. Enid Baas. Appointment set for 3/19 at 11;00 am. Client reports she currently does have all of her medications and has refills from the [facility] provider. 2. Assisted with calling [facility] regarding a bill she had received. Insurance. RN was able to update her insurance information on this call. 3. Medicaid transportation was set up for her upcoming Pulmonology apt on 11/13 at [facility], [address] Client to be picked up at her home [address] 4. RN assisted client in adding her [facility] My Chart to her My Chart app on her telephone. [name] BSN, RN Past Medical History: Past Medical History:  Diagnosis Date   (HFpEF) heart failure with preserved ejection fraction (HCC)    Asthma    Chronic hypoxic respiratory failure, on home oxygen therapy (HCC)    6L [place]   COPD (chronic obstructive pulmonary disease) (HCC)    Hypertension    Hypothyroidism    Morbid obesity with BMI of 40.0-44.9, adult (HCC)    Noncompliance with medication regimen    OSA treated with BiPAP    Smoker    Type 2 diabetes mellitus without complication, without long-term current use of insulin (HCC) 03/04/2021    Encounter Details:  Community Questionnaire - 05/12/23 1244       Questionnaire   Ask client: Do you give verbal consent for me to treat you today? Yes    Student Assistance N/A    Location Patient Served  [facility]    Encounter Setting CN site    Population Status Unknown   client lives in an efficiancy apt   Insurance Medicaid   has charitable care through [facility]   Insurance/Financial Assistance Referral N/A   Medicaid application completed at visit and taken to Social Services today   Medication N/A     Medical Provider Yes   [facility] and several specilaist at [facility]   Screening Referrals Made N/A    Medical Referrals Made Non-[facility] PCP/Clinic    Medical Appointment Completed N/A;Non-[facility] PCP/Clinic    CNP Interventions Advocate/Support;Navigate Healthcare System;Educate;Case Management    Screenings CN Performed N/A    ED Visit Averted N/A    Life-Saving Intervention Made N/A

## 2023-05-18 ENCOUNTER — Ambulatory Visit: Payer: Self-pay | Admitting: Gerontology

## 2023-05-18 ENCOUNTER — Other Ambulatory Visit: Payer: Self-pay

## 2023-05-18 MED ORDER — BREZTRI AEROSPHERE 160-9-4.8 MCG/ACT IN AERO
2.0000 | INHALATION_SPRAY | Freq: Two times a day (BID) | RESPIRATORY_TRACT | 3 refills | Status: DC
  Filled 2023-05-18 – 2023-05-31 (×2): qty 10.7, 30d supply, fill #0

## 2023-05-18 MED ORDER — VARENICLINE TARTRATE 0.5 MG PO TABS
ORAL_TABLET | ORAL | 0 refills | Status: AC
  Filled 2023-05-18 – 2023-05-31 (×2): qty 56, 30d supply, fill #0

## 2023-05-19 ENCOUNTER — Other Ambulatory Visit: Payer: Self-pay

## 2023-05-20 ENCOUNTER — Other Ambulatory Visit: Payer: Self-pay

## 2023-05-25 ENCOUNTER — Other Ambulatory Visit: Payer: Self-pay

## 2023-05-30 ENCOUNTER — Other Ambulatory Visit: Payer: Self-pay

## 2023-05-31 ENCOUNTER — Other Ambulatory Visit (HOSPITAL_BASED_OUTPATIENT_CLINIC_OR_DEPARTMENT_OTHER): Payer: Self-pay

## 2023-05-31 ENCOUNTER — Other Ambulatory Visit: Payer: Self-pay

## 2023-06-01 ENCOUNTER — Other Ambulatory Visit: Payer: Self-pay

## 2023-06-07 ENCOUNTER — Other Ambulatory Visit: Payer: Self-pay

## 2023-06-09 ENCOUNTER — Other Ambulatory Visit: Payer: Self-pay

## 2023-07-05 ENCOUNTER — Other Ambulatory Visit: Payer: Self-pay

## 2023-07-18 ENCOUNTER — Other Ambulatory Visit: Payer: Self-pay

## 2023-08-17 ENCOUNTER — Inpatient Hospital Stay
Admission: EM | Admit: 2023-08-17 | Discharge: 2023-08-18 | DRG: 193 | Disposition: A | Discharge status: Home / Self Care | Payer: Medicaid Other | Attending: Obstetrics and Gynecology | Admitting: Obstetrics and Gynecology

## 2023-08-17 ENCOUNTER — Emergency Department: Payer: Medicaid Other

## 2023-08-17 DIAGNOSIS — I110 Hypertensive heart disease with heart failure: Secondary | ICD-10-CM | POA: Diagnosis present

## 2023-08-17 DIAGNOSIS — Z7951 Long term (current) use of inhaled steroids: Secondary | ICD-10-CM

## 2023-08-17 DIAGNOSIS — J9622 Acute and chronic respiratory failure with hypercapnia: Secondary | ICD-10-CM | POA: Diagnosis present

## 2023-08-17 DIAGNOSIS — J101 Influenza due to other identified influenza virus with other respiratory manifestations: Principal | ICD-10-CM

## 2023-08-17 DIAGNOSIS — G4733 Obstructive sleep apnea (adult) (pediatric): Secondary | ICD-10-CM | POA: Diagnosis present

## 2023-08-17 DIAGNOSIS — E119 Type 2 diabetes mellitus without complications: Secondary | ICD-10-CM | POA: Diagnosis present

## 2023-08-17 DIAGNOSIS — E1169 Type 2 diabetes mellitus with other specified complication: Secondary | ICD-10-CM | POA: Diagnosis not present

## 2023-08-17 DIAGNOSIS — Z881 Allergy status to other antibiotic agents status: Secondary | ICD-10-CM

## 2023-08-17 DIAGNOSIS — Z7984 Long term (current) use of oral hypoglycemic drugs: Secondary | ICD-10-CM

## 2023-08-17 DIAGNOSIS — I214 Non-ST elevation (NSTEMI) myocardial infarction: Secondary | ICD-10-CM | POA: Diagnosis present

## 2023-08-17 DIAGNOSIS — I5032 Chronic diastolic (congestive) heart failure: Secondary | ICD-10-CM | POA: Diagnosis present

## 2023-08-17 DIAGNOSIS — Z8349 Family history of other endocrine, nutritional and metabolic diseases: Secondary | ICD-10-CM

## 2023-08-17 DIAGNOSIS — R6510 Systemic inflammatory response syndrome (SIRS) of non-infectious origin without acute organ dysfunction: Secondary | ICD-10-CM | POA: Diagnosis present

## 2023-08-17 DIAGNOSIS — E039 Hypothyroidism, unspecified: Secondary | ICD-10-CM | POA: Diagnosis present

## 2023-08-17 DIAGNOSIS — Z91018 Allergy to other foods: Secondary | ICD-10-CM | POA: Diagnosis not present

## 2023-08-17 DIAGNOSIS — Z8249 Family history of ischemic heart disease and other diseases of the circulatory system: Secondary | ICD-10-CM

## 2023-08-17 DIAGNOSIS — Z79899 Other long term (current) drug therapy: Secondary | ICD-10-CM

## 2023-08-17 DIAGNOSIS — Z720 Tobacco use: Secondary | ICD-10-CM | POA: Diagnosis not present

## 2023-08-17 DIAGNOSIS — R0602 Shortness of breath: Secondary | ICD-10-CM | POA: Diagnosis present

## 2023-08-17 DIAGNOSIS — Z9981 Dependence on supplemental oxygen: Secondary | ICD-10-CM

## 2023-08-17 DIAGNOSIS — E785 Hyperlipidemia, unspecified: Secondary | ICD-10-CM | POA: Diagnosis present

## 2023-08-17 DIAGNOSIS — I10 Essential (primary) hypertension: Secondary | ICD-10-CM | POA: Diagnosis present

## 2023-08-17 DIAGNOSIS — Z9049 Acquired absence of other specified parts of digestive tract: Secondary | ICD-10-CM

## 2023-08-17 DIAGNOSIS — J441 Chronic obstructive pulmonary disease with (acute) exacerbation: Secondary | ICD-10-CM

## 2023-08-17 DIAGNOSIS — Z880 Allergy status to penicillin: Secondary | ICD-10-CM | POA: Diagnosis not present

## 2023-08-17 DIAGNOSIS — Z79890 Hormone replacement therapy (postmenopausal): Secondary | ICD-10-CM | POA: Diagnosis not present

## 2023-08-17 DIAGNOSIS — R509 Fever, unspecified: Principal | ICD-10-CM

## 2023-08-17 DIAGNOSIS — Z83438 Family history of other disorder of lipoprotein metabolism and other lipidemia: Secondary | ICD-10-CM | POA: Diagnosis not present

## 2023-08-17 DIAGNOSIS — J9621 Acute and chronic respiratory failure with hypoxia: Secondary | ICD-10-CM | POA: Diagnosis present

## 2023-08-17 DIAGNOSIS — Z888 Allergy status to other drugs, medicaments and biological substances status: Secondary | ICD-10-CM

## 2023-08-17 DIAGNOSIS — F17210 Nicotine dependence, cigarettes, uncomplicated: Secondary | ICD-10-CM | POA: Diagnosis present

## 2023-08-17 LAB — CBC WITH DIFFERENTIAL/PLATELET
Abs Immature Granulocytes: 0.02 10*3/uL (ref 0.00–0.07)
Basophils Absolute: 0 10*3/uL (ref 0.0–0.1)
Basophils Relative: 1 %
Eosinophils Absolute: 0.1 10*3/uL (ref 0.0–0.5)
Eosinophils Relative: 1 %
HCT: 51.1 % — ABNORMAL HIGH (ref 36.0–46.0)
Hemoglobin: 16.8 g/dL — ABNORMAL HIGH (ref 12.0–15.0)
Immature Granulocytes: 0 %
Lymphocytes Relative: 12 %
Lymphs Abs: 0.9 10*3/uL (ref 0.7–4.0)
MCH: 29.1 pg (ref 26.0–34.0)
MCHC: 32.9 g/dL (ref 30.0–36.0)
MCV: 88.4 fL (ref 80.0–100.0)
Monocytes Absolute: 0.3 10*3/uL (ref 0.1–1.0)
Monocytes Relative: 5 %
Neutro Abs: 5.9 10*3/uL (ref 1.7–7.7)
Neutrophils Relative %: 81 %
Platelets: 179 10*3/uL (ref 150–400)
RBC: 5.78 MIL/uL — ABNORMAL HIGH (ref 3.87–5.11)
RDW: 12.4 % (ref 11.5–15.5)
WBC: 7.2 10*3/uL (ref 4.0–10.5)
nRBC: 0 % (ref 0.0–0.2)

## 2023-08-17 LAB — LACTIC ACID, PLASMA: Lactic Acid, Venous: 1.6 mmol/L (ref 0.5–1.9)

## 2023-08-17 LAB — COMPREHENSIVE METABOLIC PANEL
ALT: 43 U/L (ref 0–44)
AST: 48 U/L — ABNORMAL HIGH (ref 15–41)
Albumin: 3.7 g/dL (ref 3.5–5.0)
Alkaline Phosphatase: 50 U/L (ref 38–126)
Anion gap: 13 (ref 5–15)
BUN: 13 mg/dL (ref 6–20)
CO2: 24 mmol/L (ref 22–32)
Calcium: 8.5 mg/dL — ABNORMAL LOW (ref 8.9–10.3)
Chloride: 97 mmol/L — ABNORMAL LOW (ref 98–111)
Creatinine, Ser: 0.83 mg/dL (ref 0.44–1.00)
GFR, Estimated: >60 mL/min (ref >60)
Glucose, Bld: 140 mg/dL — ABNORMAL HIGH (ref 70–99)
Potassium: 4.1 mmol/L (ref 3.5–5.1)
Sodium: 134 mmol/L — ABNORMAL LOW (ref 135–145)
Total Bilirubin: 1.1 mg/dL (ref 0.0–1.2)
Total Protein: 7.1 g/dL (ref 6.5–8.1)

## 2023-08-17 LAB — URINALYSIS, W/ REFLEX TO CULTURE (INFECTION SUSPECTED)
Bilirubin Urine: NEGATIVE
Glucose, UA: NEGATIVE mg/dL
Ketones, ur: NEGATIVE mg/dL
Leukocytes,Ua: NEGATIVE
Nitrite: NEGATIVE
Protein, ur: 30 mg/dL — AB
Specific Gravity, Urine: 1.011 (ref 1.005–1.030)
pH: 8 (ref 5.0–8.0)

## 2023-08-17 LAB — RESP PANEL BY RT-PCR (RSV, FLU A&B, COVID)  RVPGX2
Influenza A by PCR: POSITIVE — AB
Influenza B by PCR: NEGATIVE
Resp Syncytial Virus by PCR: NEGATIVE
SARS Coronavirus 2 by RT PCR: NEGATIVE

## 2023-08-17 LAB — TROPONIN I (HIGH SENSITIVITY)
Troponin I (High Sensitivity): 78 ng/L — ABNORMAL HIGH (ref <18)
Troponin I (High Sensitivity): 105 ng/L (ref <18)
Troponin I (High Sensitivity): 61 ng/L — ABNORMAL HIGH (ref <18)
Troponin I (High Sensitivity): 50 ng/L — ABNORMAL HIGH (ref <18)

## 2023-08-17 LAB — GLUCOSE, CAPILLARY
Glucose-Capillary: 188 mg/dL — ABNORMAL HIGH (ref 70–99)
Glucose-Capillary: 173 mg/dL — ABNORMAL HIGH (ref 70–99)

## 2023-08-17 LAB — CBG MONITORING, ED: Glucose-Capillary: 195 mg/dL — ABNORMAL HIGH (ref 70–99)

## 2023-08-17 LAB — BRAIN NATRIURETIC PEPTIDE: B Natriuretic Peptide: 192.1 pg/mL — ABNORMAL HIGH (ref 0.0–100.0)

## 2023-08-17 MED ORDER — ONDANSETRON HCL 4 MG PO TABS: 4.0000 mg | ORAL_TABLET | Freq: Four times a day (QID) | ORAL | Status: DC | PRN

## 2023-08-17 MED ORDER — METHYLPREDNISOLONE SODIUM SUCC 40 MG IJ SOLR
40.0000 mg | Freq: Two times a day (BID) | INTRAMUSCULAR | Status: AC
  Administered 2023-08-17 – 2023-08-18 (×2): 40 mg via INTRAVENOUS
  Filled 2023-08-17 (×2): qty 1

## 2023-08-17 MED ORDER — GUAIFENESIN-DM 100-10 MG/5ML PO SYRP
10.0000 mL | ORAL_SOLUTION | Freq: Once | ORAL | Status: AC
  Administered 2023-08-17: 10 mL via ORAL
  Filled 2023-08-17: qty 10

## 2023-08-17 MED ORDER — OSELTAMIVIR PHOSPHATE 75 MG PO CAPS
75.0000 mg | ORAL_CAPSULE | Freq: Two times a day (BID) | ORAL | Status: DC
  Administered 2023-08-17 – 2023-08-18 (×2): 75 mg via ORAL
  Filled 2023-08-17 (×2): qty 1

## 2023-08-17 MED ORDER — ONDANSETRON HCL 4 MG/2ML IJ SOLN: 4.0000 mg | Freq: Four times a day (QID) | INTRAMUSCULAR | Status: DC | PRN

## 2023-08-17 MED ORDER — METHYLPREDNISOLONE SODIUM SUCC 125 MG IJ SOLR
125.0000 mg | Freq: Once | INTRAMUSCULAR | Status: AC
  Administered 2023-08-17: 125 mg via INTRAVENOUS
  Filled 2023-08-17: qty 2

## 2023-08-17 MED ORDER — IPRATROPIUM-ALBUTEROL 0.5-2.5 (3) MG/3ML IN SOLN
3.0000 mL | Freq: Four times a day (QID) | RESPIRATORY_TRACT | Status: DC
Start: 2023-08-17 — End: 2023-08-18
  Administered 2023-08-17 – 2023-08-18 (×3): 3 mL via RESPIRATORY_TRACT
  Filled 2023-08-17 (×4): qty 3

## 2023-08-17 MED ORDER — ACETAMINOPHEN 500 MG PO TABS
1000.0000 mg | ORAL_TABLET | Freq: Once | ORAL | Status: AC
  Administered 2023-08-17: 1000 mg via ORAL
  Filled 2023-08-17: qty 2

## 2023-08-17 MED ORDER — HYDROCOD POLI-CHLORPHE POLI ER 10-8 MG/5ML PO SUER
5.0000 mL | Freq: Once | ORAL | Status: AC
  Administered 2023-08-17: 5 mL via ORAL
  Filled 2023-08-17: qty 5

## 2023-08-17 MED ORDER — ACETAMINOPHEN 325 MG PO TABS
650.0000 mg | ORAL_TABLET | Freq: Four times a day (QID) | ORAL | Status: DC | PRN
  Administered 2023-08-17 – 2023-08-18 (×3): 650 mg via ORAL
  Filled 2023-08-17 (×3): qty 2

## 2023-08-17 MED ORDER — NICOTINE 14 MG/24HR TD PT24
14.0000 mg | MEDICATED_PATCH | Freq: Every day | TRANSDERMAL | Status: DC
  Administered 2023-08-17: 14 mg via TRANSDERMAL
  Filled 2023-08-17: qty 1

## 2023-08-17 MED ORDER — SODIUM CHLORIDE 0.9 % IV SOLN
500.0000 mg | INTRAVENOUS | Status: AC
  Administered 2023-08-17: 500 mg via INTRAVENOUS
  Filled 2023-08-17: qty 5

## 2023-08-17 MED ORDER — PREDNISONE 20 MG PO TABS: 40.0000 mg | ORAL_TABLET | Freq: Every day | ORAL | Status: DC

## 2023-08-17 MED ORDER — OSELTAMIVIR PHOSPHATE 75 MG PO CAPS
75.0000 mg | ORAL_CAPSULE | Freq: Two times a day (BID) | ORAL | Status: DC
Start: 2023-08-17 — End: 2023-08-17

## 2023-08-17 MED ORDER — ENOXAPARIN SODIUM 40 MG/0.4ML IJ SOSY
40.0000 mg | PREFILLED_SYRINGE | INTRAMUSCULAR | Status: DC
  Administered 2023-08-17: 40 mg via SUBCUTANEOUS
  Filled 2023-08-17: qty 0.4

## 2023-08-17 MED ORDER — IPRATROPIUM-ALBUTEROL 0.5-2.5 (3) MG/3ML IN SOLN
3.0000 mL | Freq: Once | RESPIRATORY_TRACT | Status: AC
  Administered 2023-08-17: 3 mL via RESPIRATORY_TRACT
  Filled 2023-08-17: qty 3

## 2023-08-17 MED ORDER — ALBUTEROL SULFATE (2.5 MG/3ML) 0.083% IN NEBU: 2.5000 mg | INHALATION_SOLUTION | RESPIRATORY_TRACT | Status: DC | PRN

## 2023-08-17 MED ORDER — SODIUM CHLORIDE 0.9 % IV SOLN: INTRAVENOUS | Status: AC

## 2023-08-17 MED ORDER — OSELTAMIVIR PHOSPHATE 75 MG PO CAPS
75.0000 mg | ORAL_CAPSULE | Freq: Once | ORAL | Status: AC
  Administered 2023-08-17: 75 mg via ORAL
  Filled 2023-08-17: qty 1

## 2023-08-17 MED ORDER — AZITHROMYCIN 500 MG PO TABS: 500.0000 mg | ORAL_TABLET | Freq: Every day | ORAL | Status: DC

## 2023-08-17 MED ORDER — INSULIN ASPART 100 UNIT/ML IJ SOLN
0.0000 [IU] | Freq: Three times a day (TID) | INTRAMUSCULAR | Status: DC
  Administered 2023-08-17 (×2): 2 [IU] via SUBCUTANEOUS
  Administered 2023-08-18: 1 [IU] via SUBCUTANEOUS
  Filled 2023-08-17 (×3): qty 1

## 2023-08-17 MED ORDER — SODIUM CHLORIDE 0.9 % IV BOLUS
1000.0000 mL | Freq: Once | INTRAVENOUS | Status: AC
  Administered 2023-08-17: 1000 mL via INTRAVENOUS

## 2023-08-17 NOTE — Assessment & Plan Note (Signed)
SSI  A1C  Monitor sugars w/ steroid use

## 2023-08-17 NOTE — ED Provider Notes (Signed)
Care assumed of patient from outgoing provider.  See their note for initial history, exam and plan.  Clinical Course as of 08/17/23 1101  Wed Aug 17, 2023  4540 Chest x-ray negative for infection.  Care be transferred to the oncoming provider at change of shift pending laboratory results and reassessment. [JS]  0740 Past medical history significant for COPD but not on home oxygen.  Presents to the emergency department with fever.  No hypoxia.  Speaking in full sentences with no signs of respiratory distress.  Sepsis protocol initiated but not yet given antibiotics waiting on influenza testing.  Chest x-ray without focal findings consistent with pneumonia.  Given treatment for COPD. [SM]    Clinical Course User Index [JS] Irean Hong, MD [SM] Corena Herter, MD   Troponin elevated and repeat troponin continues to be elevated, currently chest pain-free.  No findings of significant heart failure exacerbation.  Influenza A is positive.  Started on Tamiflu.  Patient received treatment for DuoNebs and COPD exacerbation.  Found to be hypoxic requiring 2 L nasal cannula.  Will consult hospitalist for further management and admission of acute hypoxic respiratory failure in the setting of influenza.  Elevated troponin likely demand ischemia. .Critical Care  Performed by: Corena Herter, MD Authorized by: Corena Herter, MD   Critical care provider statement:    Critical care time (minutes):  30   Critical care time was exclusive of:  Separately billable procedures and treating other patients   Critical care was necessary to treat or prevent imminent or life-threatening deterioration of the following conditions:  Sepsis, cardiac failure and respiratory failure   Critical care was time spent personally by me on the following activities:  Development of treatment plan with patient or surrogate, discussions with consultants, evaluation of patient's response to treatment, examination of patient, ordering and  review of laboratory studies, ordering and review of radiographic studies, ordering and performing treatments and interventions, pulse oximetry, re-evaluation of patient's condition and review of old charts   Care discussed with: admitting provider       Corena Herter, MD 08/17/23 1102

## 2023-08-17 NOTE — H&P (Addendum)
History and Physical    Patient: Lindsey Benjamin:[id number] DOB: 20-Jan-1966 DOA: 08/17/2023 DOS: the patient was seen and examined on 08/17/2023 PCP: Enid Baas, MD  Patient coming from: Home  Chief Complaint:  Chief Complaint  Patient presents with   Shortness of Breath   HPI: Lindsey Benjamin is a 58 y.o. female with medical history significant of morbid obesity, severe OSA requiring BiPAP nightly at baseline, hypertension, type 2 diabetes, chronic hypoxic respiratory failure on 2 L oxygen supplementation continuously at baseline presenting with acute on chronic respiratory failure with hypoxia, COPD exacerbation, influenza A.  Patient reports increased work of breathing over the past 2 to 3 days.  Positive generalized malaise, cough wheezing increased sputum production.  Wears 2 to 3 L at home nasal cannula with BiPAP use at night.  Positive increased work of breathing despite home oxygen.  Also persistence of symptoms even with home inhaler use.  Still smoking up to 1/2 pack/day.  Is ready to quit.  No chest pain.  No abdominal pain.  No reported orthopnea or PND.  No reported lower extremity swelling. Presented to the ER Tmax 101.7, heart rate 100s, BP stable.  Satting in mid 90s on 3+ liters nasal cannula at present.  White count 7.2, hemoglobin 16.8, platelets 179, troponin 70s to 100s.  Creatinine 0.83.  Glucose 140. EKG sinus tachycardia. CXR WNL.  Review of Systems: As mentioned in the history of present illness. All other systems reviewed and are negative. Past Medical History:  Diagnosis Date   (HFpEF) heart failure with preserved ejection fraction (HCC)    Asthma    Chronic hypoxic respiratory failure, on home oxygen therapy (HCC)    6L [place]   COPD (chronic obstructive pulmonary disease) (HCC)    Hypertension    Hypothyroidism    Morbid obesity with BMI of 40.0-44.9, adult (HCC)    Noncompliance with medication regimen    OSA treated with BiPAP    Smoker    Type  2 diabetes mellitus without complication, without long-term current use of insulin (HCC) 03/04/2021   Past Surgical History:  Procedure Laterality Date   CARDIAC CATHETERIZATION  11/2020   CESAREAN SECTION     CHOLECYSTECTOMY     FOOT SURGERY     INNER EAR SURGERY     tubes in ears and repair a hole in the eardrum   Social History:  reports that she has been smoking cigarettes. She has a 10 pack-year smoking history. She has never used smokeless tobacco. She reports that she does not currently use alcohol. She reports that she does not use drugs.  Allergies  Allergen Reactions   Ceclor [Cefaclor] Hives   Levofloxacin Rash   Lisinopril Cough   Ofloxacin Swelling   Penicillins Anaphylaxis   Ranitidine Hives   Tomato Rash    Family History  Problem Relation Age of Onset   Cushing syndrome Mother    Thyroid disease Mother    Heart attack Father 35   Hyperlipidemia Brother    Thyroid disease Brother    Hypertension Brother    Breast cancer Neg Hx     Prior to Admission medications   Medication Sig Start Date End Date Taking? Authorizing Provider  acetaminophen (TYLENOL) 500 MG tablet Take 500 mg by mouth every 6 (six) hours as needed.   Yes [provider]  albuterol (VENTOLIN HFA) 108 (90 Base) MCG/ACT inhaler Inhale 2 puffs into the lungs 4 (four) times daily. 04/20/23 04/19/24 Yes Iloabachie, Chioma  E, NP  amLODipine (NORVASC) 2.5 MG tablet Take 1 tablet (2.5 mg total) by mouth daily. 05/11/23 08/17/23 Yes Iloabachie, Chioma E, NP  Budeson-Glycopyrrol-Formoterol (BREZTRI AEROSPHERE) 160-9-4.8 MCG/ACT AERO Inhale 2 puffs into the lungs 2 (two) times daily. 05/18/23  Yes   furosemide (LASIX) 40 MG tablet Take 40 mg by mouth daily.   Yes [provider]  gabapentin (NEURONTIN) 600 MG tablet Take (1/2) tablet (300 mg total) by mouth once daily at bedtime. 08/11/21  Yes   glipiZIDE (GLUCOTROL) 5 MG tablet Take 1 tablet (5 mg total) by mouth daily before breakfast.  04/20/23  Yes Iloabachie, Chioma E, NP  levothyroxine (SYNTHROID) 175 MCG tablet Take 1 tablet (175 mcg total) by mouth daily before breakfast. 05/11/23 08/17/23 Yes Iloabachie, Chioma E, NP  loperamide (IMODIUM) 2 MG capsule Take 2 mg by mouth as needed for diarrhea or loose stools. 06/25/23  Yes [provider]  pantoprazole (PROTONIX) 40 MG tablet Take 1 tablet (40 mg total) by mouth at bedtime. 04/20/23  Yes Iloabachie, Chioma E, NP  spironolactone (ALDACTONE) 25 MG tablet Take 1 tablet (25 mg total) by mouth daily. 05/11/23 08/17/23 Yes Iloabachie, Chioma E, NP  blood glucose meter kit and supplies KIT USE AS DIRECTED TO CHECK BLOOD SUGAR UP TO 4 TIMES DAILY. 11/11/21   Iloabachie, Chioma E, NP  Blood Glucose Monitoring Suppl (TRUE METRIX METER) w/Device KIT Use as directed to check blood sugar. 04/20/23   Iloabachie, Chioma E, NP  Blood Pressure KIT USE AS DIRECTED DAILY. 11/11/21   Iloabachie, Chioma E, NP  fluticasone-salmeterol (WIXELA INHUB) 250-50 MCG/ACT AEPB Inhale 1 puff into the lungs 2 (two) times daily (morning and at bedtime) Patient not taking: Reported on 08/17/2023 03/09/23   Iloabachie, Chioma E, NP  glucose blood (RIGHTEST GS550 BLOOD GLUCOSE) test strip USE AS DIRECTED TO CHECK BLOOD SUGAR UP TO 4 TIMES DAILY. 04/26/23   Iloabachie, Chioma E, NP  losartan (COZAAR) 100 MG tablet Take 1 tablet (100 mg total) by mouth daily. 05/11/23 08/09/23  Iloabachie, Chioma E, NP  Multiple Vitamin (MULTI-VITAMIN) tablet Take 1 tablet by mouth daily.    [provider]  pravastatin (PRAVACHOL) 40 MG tablet Take 1 tablet (40 mg total) by mouth daily at 6 PM. 05/11/23 08/09/23  Iloabachie, Chioma E, NP  Rightest GL300 Lancets MISC USE AS DIRECTED TO CHECK BLOOD SUGAR UP TO 4 TIMES DAILY. 04/26/23   Rolm Gala, NP    Physical Exam: Vitals:   08/17/23 0600 08/17/23 0630 08/17/23 0900 08/17/23 1059  BP: (!) 160/93 (!) 157/71 (!) 115/44   Pulse: (!) 110 97 91   Resp: 19 20 18     Temp:    98.4 F (36.9 C)  TempSrc:    Oral  SpO2: 96% 94% 94%    Physical Exam Constitutional:      Appearance: She is obese.  HENT:     Head: Normocephalic and atraumatic.     Nose: Nose normal.     Mouth/Throat:     Mouth: Mucous membranes are moist.  Eyes:     Pupils: Pupils are equal, round, and reactive to light.  Cardiovascular:     Rate and Rhythm: Normal rate and regular rhythm.  Pulmonary:     Effort: Pulmonary effort is normal.     Breath sounds: Wheezing present.  Abdominal:     General: Bowel sounds are normal.  Musculoskeletal:        General: Normal range of motion.  Skin:  General: Skin is warm.  Neurological:     General: No focal deficit present.  Psychiatric:        Mood and Affect: Mood normal.     Data Reviewed:  There are no new results to review at this time.  DG Chest Port 1 View CLINICAL DATA:  58 year old female with history of shortness of breath.  EXAM: PORTABLE CHEST 1 VIEW  COMPARISON:  Chest x-ray 03/04/2023.  FINDINGS: Lung volumes are normal. No consolidative airspace disease. No pleural effusions. No pneumothorax. No pulmonary nodule or mass noted. Pulmonary vasculature and the cardiomediastinal silhouette are within normal limits.  IMPRESSION: No radiographic evidence of acute cardiopulmonary disease.  Electronically Signed   By: Trudie Reed M.D.   On: 08/17/2023 06:43  Lab Results  Component Value Date   WBC 7.2 08/17/2023   HGB 16.8 (H) 08/17/2023   HCT 51.1 (H) 08/17/2023   MCV 88.4 08/17/2023   PLT 179 08/17/2023   Last metabolic panel Lab Results  Component Value Date   GLUCOSE 140 (H) 08/17/2023   NA 134 (L) 08/17/2023   K 4.1 08/17/2023   CL 97 (L) 08/17/2023   CO2 24 08/17/2023   BUN 13 08/17/2023   CREATININE 0.83 08/17/2023   GFRNONAA >60 08/17/2023   CALCIUM 8.5 (L) 08/17/2023   PHOS 3.7 03/08/2023   PROT 7.1 08/17/2023   ALBUMIN 3.7 08/17/2023   LABGLOB 3.0 11/04/2021   AGRATIO  1.5 11/04/2021   BILITOT 1.1 08/17/2023   ALKPHOS 50 08/17/2023   AST 48 (H) 08/17/2023   ALT 43 08/17/2023   ANIONGAP 13 08/17/2023    Assessment and Plan: Acute on chronic respiratory failure with hypoxia and hypercapnia (HCC) Influenza A  COPD Exacerbation  Decompensated resp failure requiring 3+ [place] w/ noted baseline 2L [place] use at home and night time BIPAP in setting of influenza A and COPD  CXR stable  IV solumedrol Duonebs  IV azithromycin  Tamiflu  Cont supplemental O2 and at bedtime BIPAP 1/2 PPD smoker- discussed smoking cessation    NSTEMI (non-ST elevated myocardial infarction) (HCC) Troponin 70-->103 in setting of acute on chronic resp failure, COPD Exacerbation, influenza A  No active chest pain  Suspect secondary demand ischemia  Will trend trop  Baby ASA  Cardiology consultation as clinically indicated   SIRS (systemic inflammatory response syndrome) (HCC) Meeting SIRS criteria by T 101.7, HR 100s Lactate pending  Noted influenza A and COPD  Minimal IVF in setting of HFpEF  Tamifllu Monitor   Chronic heart failure with preserved ejection fraction (HFpEF) (HCC) 2D ECHO 03/2023 w/ EF 55%  Appears euvolemic to clinically dry on exam  Gentle IVF hydration  Wt 92kg today  Monitor volume status w/ hydration   Type 2 diabetes mellitus without complication, without long-term current use of insulin (HCC) SSI  A1C  Monitor sugars w/ steroid use    Hypothyroidism Cont synthroid   Essential hypertension BP stable  Titrate home regimen        Advance Care Planning:   Code Status: Full Code   Consults: None   Family Communication: No family at the bedside   Severity of Illness: The appropriate patient status for this patient is INPATIENT. Inpatient status is judged to be reasonable and necessary in order to provide the required intensity of service to ensure the patient's safety. The patient's presenting symptoms, physical exam findings, and initial  radiographic and laboratory data in the context of their chronic comorbidities is felt to place them  at high risk for further clinical deterioration. Furthermore, it is not anticipated that the patient will be medically stable for discharge from the hospital within 2 midnights of admission.   * I certify that at the point of admission it is my clinical judgment that the patient will require inpatient hospital care spanning beyond 2 midnights from the point of admission due to high intensity of service, high risk for further deterioration and high frequency of surveillance required.*  Author: Floydene Flock, MD 08/17/2023 11:54 AM  For on call review [email or web address].

## 2023-08-17 NOTE — Assessment & Plan Note (Signed)
Influenza A  COPD Exacerbation  Decompensated resp failure requiring 3+ [place] w/ noted baseline 2L [place] use at home and night time BIPAP in setting of influenza A and COPD  CXR stable  IV solumedrol Duonebs  IV azithromycin  Tamiflu  Cont supplemental O2 and at bedtime BIPAP 1/2 PPD smoker- discussed smoking cessation

## 2023-08-17 NOTE — ED Notes (Signed)
The pt was sitting upright in the bed. The pt was warm, pink, and dry. The pt is alert and oriented x 4. The pt was on room air. The pt denied needing assistance. Call bell on bed rail.

## 2023-08-17 NOTE — Assessment & Plan Note (Signed)
BP stable Titrate home regimen

## 2023-08-17 NOTE — Assessment & Plan Note (Signed)
Meeting SIRS criteria by T 101.7, HR 100s Lactate pending  Noted influenza A and COPD  Minimal IVF in setting of HFpEF  Tamifllu Monitor

## 2023-08-17 NOTE — Assessment & Plan Note (Signed)
Cont synthroid

## 2023-08-17 NOTE — Assessment & Plan Note (Signed)
2D ECHO 03/2023 w/ EF 55%  Appears euvolemic to clinically dry on exam  Gentle IVF hydration  Wt 92kg today  Monitor volume status w/ hydration

## 2023-08-17 NOTE — Assessment & Plan Note (Signed)
Troponin 70-->103 in setting of acute on chronic resp failure, COPD Exacerbation, influenza A  No active chest pain  Suspect secondary demand ischemia  Will trend trop  Baby ASA  Cardiology consultation as clinically indicated

## 2023-08-17 NOTE — Progress Notes (Signed)
OT Cancellation Note  Patient Details Name: Lindsey Benjamin MRN: [id number] DOB: 20-Jun-1966   Cancelled Treatment:    Reason Eval/Treat Not Completed: OT screened, no needs identified, will sign off. Order received, chart reviewed. Pt reports near baseline functional independence and declines OT eval at this time. Will sign off. Please re-consult if additional needs arise.   Kathie Dike, M.S. OTR/L  08/17/23, 2:07 PM  ascom [phone]

## 2023-08-17 NOTE — ED Triage Notes (Signed)
C/O ShOB x 2 hours. Denies CP.

## 2023-08-17 NOTE — Evaluation (Signed)
Physical Therapy Evaluation Patient Details Name: Lindsey Benjamin MRN: [id number] DOB: 01/17/1966 Today's Date: 08/17/2023  History of Present Illness  Pt is a 58 y.o. female with medical history significant of morbid obesity, severe OSA requiring BiPAP nightly at baseline, hypertension, type 2 diabetes, chronic hypoxic respiratory failure on 2 L oxygen supplementation continuously at baseline presenting with acute on chronic respiratory failure with hypoxia, NSTEMI, COPD exacerbation, influenza A.   Clinical Impression  Pt was pleasant and motivated to participate during the session and put forth good effort throughout. Pt required extra time, effort, and cuing with functional training but no physical assistance. Pt training provided on proper use of RW in and out of a bed with a stool but pt adamant about sliding from the EOB to the floor instead of using the stool and to side-step up onto the stool to get back to bed secondary to her having a stool at home where she uses said strategies.  Pt demonstrated good control and stability throughout.  Pt was generally steady with gait with slow cadence and short B step length with cues to amb closer to the RW with upright posture.  Pt is a community ambulator at baseline but fatigued after around 30 feet of walking during the session that necessitated a seated rest.  Pt's SpO2 and HR were both WNL on 2L during the session.  Pt reported that she had been receiving HHPT and OT until she graduated very recently and stated she did not desire further surfaces once discharged.  Pt will benefit from continued PT services while in acute care to safely address deficits listed in patient problem list for decreased caregiver assistance and eventual return to PLOF.          If plan is discharge home, recommend the following: A little help with walking and/or transfers;A little help with bathing/dressing/bathroom;Assist for transportation   Can travel by private  vehicle        Equipment Recommendations None recommended by PT  Recommendations for Other Services       Functional Status Assessment Patient has had a recent decline in their functional status and demonstrates the ability to make significant improvements in function in a reasonable and predictable amount of time.     Precautions / Restrictions Precautions Precautions: Fall Restrictions Weight Bearing Restrictions Per Provider Order: No      Mobility  Bed Mobility Overal bed mobility: Modified Independent             General bed mobility comments: Min extra time and effort only    Transfers Overall transfer level: Needs assistance Equipment used: Rolling walker (2 wheels) Transfers: Sit to/from Stand Sit to Stand: Supervision           General transfer comment: Transfer training provided on how to use a RW with a stool to get into and out of bed but with pt preferring to slowly slide off of the edge of bed to the floor and preferring to side step up onto the stool to get back into bed; fair to good stability    Ambulation/Gait Ambulation/Gait assistance: Supervision Gait Distance (Feet): 30 Feet Assistive device: Rolling walker (2 wheels) Gait Pattern/deviations: Step-through pattern, Decreased step length - right, Decreased step length - left Gait velocity: decreased     General Gait Details: Pt steady with gait with slow cadence and short B step length with min verbal cues for amb closer to the [employer]  Wheelchair Mobility     Tilt Bed    Modified Rankin (Stroke Patients Only)       Balance Overall balance assessment: Needs assistance Sitting-balance support: No upper extremity supported Sitting balance-Leahy Scale: Good     Standing balance support: Bilateral upper extremity supported, During functional activity, Reliant on assistive device for balance Standing balance-Leahy Scale: Good                                Pertinent Vitals/Pain Pain Assessment Pain Assessment: No/denies pain    Home Living Family/patient expects to be discharged to:: Private residence Living Arrangements: Spouse/significant other Available Help at Discharge: Available PRN/intermittently Type of Home: Apartment Home Access: Level entry       Home Layout: One level Home Equipment: Pharmacist, hospital (2 wheels);Rollator (4 wheels);Hospital bed      Prior Function Prior Level of Function : Independent/Modified Independent             Mobility Comments: Ind amb in the home without an AD, rollator for community ambulation, no fall history ADLs Comments: Ind with ADLs "where I can reach" with assist as needed otherwise     Extremity/Trunk Assessment   Upper Extremity Assessment Upper Extremity Assessment: Overall WFL for tasks assessed    Lower Extremity Assessment Lower Extremity Assessment: Overall WFL for tasks assessed       Communication   Communication Communication: No apparent difficulties    Cognition Arousal: Alert Behavior During Therapy: WFL for tasks assessed/performed   PT - Cognitive impairments: No apparent impairments                         Following commands: Intact       Cueing Cueing Techniques: Verbal cues, Visual cues     General Comments      Exercises     Assessment/Plan    PT Assessment Patient needs continued PT services  PT Problem List Decreased activity tolerance;Decreased knowledge of use of DME       PT Treatment Interventions DME instruction;Gait training;Functional mobility training;Therapeutic activities;Therapeutic exercise;Balance training;Patient/family education    PT Goals (Current goals can be found in the Care Plan section)  Acute Rehab PT Goals Patient Stated Goal: Improved endurance PT Goal Formulation: With patient Time For Goal Achievement: 08/30/23 Potential to Achieve Goals: Good    Frequency Min  1X/week     Co-evaluation               AM-PAC PT "6 Clicks" Mobility  Outcome Measure Help needed turning from your back to your side while in a flat bed without using bedrails?: None Help needed moving from lying on your back to sitting on the side of a flat bed without using bedrails?: None Help needed moving to and from a bed to a chair (including a wheelchair)?: A Little Help needed standing up from a chair using your arms (e.g., wheelchair or bedside chair)?: A Little Help needed to walk in hospital room?: A Little Help needed climbing 3-5 steps with a railing? : A Little 6 Click Score: 20    End of Session Equipment Utilized During Treatment: Gait belt Activity Tolerance: Patient tolerated treatment well Patient left: in bed;with call bell/phone within reach;Other (comment) (bilateral bed rails up as pt was found) Nurse Communication: Mobility status PT Visit Diagnosis: Difficulty in walking, not elsewhere classified (R26.2)    Time: 4098-1191 PT  Time Calculation (min) (ACUTE ONLY): 24 min   Charges:   PT Evaluation $PT Eval Moderate Complexity: 1 Mod PT Treatments $Therapeutic Activity: 8-22 mins PT General Charges $$ ACUTE PT VISIT: 1 Visit       D. Elly Modena PT, DPT 08/17/23, 2:58 PM

## 2023-08-17 NOTE — Plan of Care (Signed)
  Problem: Fluid Volume: Goal: Ability to maintain a balanced intake and output will improve Outcome: Progressing   Problem: Metabolic: Goal: Ability to maintain appropriate glucose levels will improve Outcome: Progressing   Problem: Nutritional: Goal: Maintenance of adequate nutrition will improve Outcome: Progressing   Problem: Tissue Perfusion: Goal: Adequacy of tissue perfusion will improve Outcome: Progressing

## 2023-08-17 NOTE — ED Notes (Addendum)
The pt was placed on 2 liters via nasal cannula due to her pulse ox being 90 percent on room air.

## 2023-08-17 NOTE — ED Provider Notes (Signed)
[facility] Provider Note    Event Date/Time   First MD Initiated Contact with Patient 08/17/23 [phone]     (approximate)   History   Shortness of Breath   HPI  Lindsey Benjamin is a 58 y.o. female brought to the ED via EMS from home with a chief complaint of cough and shortness of breath.  Patient with a history of COPD who states she is not on home oxygen, OSA with BiPAP who states part of her tubing is broken so she does not feel she is getting adequate oxygen.  Endorses nasal congestion, productive cough, shortness of breath.  Denies chest pain, abdominal pain, nausea, vomiting or diarrhea.     Past Medical History   Past Medical History:  Diagnosis Date   (HFpEF) heart failure with preserved ejection fraction (HCC)    Asthma    Chronic hypoxic respiratory failure, on home oxygen therapy (HCC)    6L [place]   COPD (chronic obstructive pulmonary disease) (HCC)    Hypertension    Hypothyroidism    Morbid obesity with BMI of 40.0-44.9, adult (HCC)    Noncompliance with medication regimen    OSA treated with BiPAP    Smoker    Type 2 diabetes mellitus without complication, without long-term current use of insulin (HCC) 03/04/2021     Active Problem List   Patient Active Problem List   Diagnosis Date Noted   Chronic obstructive pulmonary disease (COPD) (HCC) 04/20/2023   Left leg pain 04/20/2023   Pericardial effusion 03/06/2023   Acute hypercapnic respiratory failure (HCC) 03/06/2023   Acute on chronic heart failure with preserved ejection fraction (HCC) 03/06/2023   Acute on chronic respiratory failure with hypoxia and hypercapnia (HCC) 03/03/2023   Asthma 07/15/2021   Type 2 diabetes mellitus without complication, without long-term current use of insulin (HCC) 03/04/2021   Leg swelling 01/28/2021   Hyperlipidemia 11/05/2020   Mental health problem 06/18/2020   Pain due to onychomycosis of toenails of both feet 04/30/2019   Pincer nail  deformity 04/30/2019   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 Authorizing Provider  acetaminophen (TYLENOL) 500 MG tablet Take 500 mg by mouth every 6 (six) hours as needed.    [provider]  albuterol (VENTOLIN HFA) 108 (90 Base) MCG/ACT inhaler Inhale 2 puffs into the lungs 4 (four) times daily. 04/20/23 04/19/24  Iloabachie, Chioma E, NP  amLODipine (NORVASC) 2.5 MG tablet Take 1 tablet (2.5 mg total) by mouth daily. 05/11/23 08/09/23  Iloabachie, Chioma E, NP  blood glucose meter kit and supplies KIT USE AS DIRECTED TO CHECK BLOOD SUGAR UP TO 4 TIMES DAILY. 11/11/21   Iloabachie, Chioma E, NP  Blood Glucose Monitoring Suppl (TRUE METRIX METER) w/Device KIT Use as directed to check blood sugar. 04/20/23   Iloabachie, Chioma E, NP  Blood Pressure KIT USE AS DIRECTED DAILY. 11/11/21   Iloabachie, Chioma  E, NP  Budeson-Glycopyrrol-Formoterol (BREZTRI AEROSPHERE) 160-9-4.8 MCG/ACT AERO Inhale 2 puffs into the lungs 2 (two) times daily. 05/18/23     fluticasone-salmeterol (WIXELA INHUB) 250-50 MCG/ACT AEPB Inhale 1 puff into the lungs 2 (two) times daily (morning and at bedtime) 03/09/23   Iloabachie, Chioma E, NP  gabapentin (NEURONTIN) 600 MG tablet Take (1/2) tablet (300 mg total) by mouth once daily at bedtime. 08/11/21     glipiZIDE (GLUCOTROL) 5 MG tablet Take 1 tablet (5 mg total) by mouth daily before breakfast. 04/20/23   Iloabachie, Chioma E, NP  glucose blood (RIGHTEST GS550 BLOOD GLUCOSE)  test strip USE AS DIRECTED TO CHECK BLOOD SUGAR UP TO 4 TIMES DAILY. 04/26/23   Iloabachie, Chioma E, NP  levothyroxine (SYNTHROID) 175 MCG tablet Take 1 tablet (175 mcg total) by mouth daily before breakfast. 05/11/23 08/09/23  Iloabachie, Chioma E, NP  losartan (COZAAR) 100 MG tablet Take 1 tablet (100 mg total) by mouth daily. 05/11/23 08/09/23  Iloabachie, Chioma E, NP  pantoprazole (PROTONIX) 40 MG tablet Take 1 tablet (40 mg total) by mouth at bedtime. 04/20/23   Iloabachie, Chioma E, NP  pravastatin (PRAVACHOL) 40 MG tablet Take 1 tablet (40 mg total) by mouth daily at 6 PM. 05/11/23 08/09/23  Iloabachie, Chioma E, NP  Rightest GL300 Lancets MISC USE AS DIRECTED TO CHECK BLOOD SUGAR UP TO 4 TIMES DAILY. 04/26/23   Iloabachie, Chioma E, NP  spironolactone (ALDACTONE) 25 MG tablet Take 1 tablet (25 mg total) by mouth daily. 05/11/23 06/10/23  Iloabachie, Chioma E, NP     Allergies  Ceclor [cefaclor], Levofloxacin, Lisinopril, Ofloxacin, Penicillins, Ranitidine, and Tomato   Family History   Family History  Problem Relation Age of Onset   Cushing syndrome Mother    Thyroid disease Mother    Heart attack Father 34   Hyperlipidemia Brother    Thyroid disease Brother    Hypertension Brother    Breast cancer Neg Hx      Physical Exam  Triage Vital Signs: ED Triage Vitals  Encounter Vitals Group     BP      Systolic BP Percentile      Diastolic BP Percentile      Pulse      Resp      Temp      Temp src      SpO2      Weight      Height      Head Circumference      Peak Flow      Pain Score      Pain Loc      Pain Education      Exclude from Growth Chart     Updated Vital Signs: BP (!) 157/71   Pulse 97   Temp (!) 101.7 F (38.7 C) (Oral)   Resp 20   LMP 07/15/2016 (Approximate)   SpO2 94%    General: Awake, mild distress.  CV:  Tachycardic.  Good peripheral perfusion.  Resp:  Increased effort.  Diminished, otherwise CTAB.  Loose cough noted. Abd:  Nontender.  No  distention.  Other:  No pedal edema.   ED Results / Procedures / Treatments  Labs (all labs ordered are listed, but only abnormal results are displayed) Labs Reviewed  CULTURE, BLOOD (ROUTINE X 2)  CULTURE, BLOOD (ROUTINE X 2)  RESP PANEL BY RT-PCR (RSV, FLU A&B, COVID)  [id number]  CBC WITH DIFFERENTIAL/PLATELET  COMPREHENSIVE METABOLIC PANEL  BRAIN NATRIURETIC PEPTIDE  URINALYSIS, W/ REFLEX  TO CULTURE (INFECTION SUSPECTED)  TROPONIN I (HIGH SENSITIVITY)     EKG  ED ECG REPORT I, Neyda Durango J, the attending physician, personally viewed and interpreted this ECG.   Date: 08/17/2023  EKG Time: 0559  Rate: 107  Rhythm: sinus tachycardia  Axis: LAD  Intervals:none  ST&T Change: Nonspecific    RADIOLOGY I have independently visualized and interpreted patient's imaging study as well as noted the radiology interpretation:  Chest x-ray: No acute cardiopulmonary process  Official radiology report(s): DG Chest Port 1 View Result Date: 08/17/2023 CLINICAL DATA:  58 year old female with history of shortness of breath. EXAM: PORTABLE CHEST 1 VIEW COMPARISON:  Chest x-ray 03/04/2023. FINDINGS: Lung volumes are normal. No consolidative airspace disease. No pleural effusions. No pneumothorax. No pulmonary nodule or mass noted. Pulmonary vasculature and the cardiomediastinal silhouette are within normal limits. IMPRESSION: No radiographic evidence of acute cardiopulmonary disease. Electronically Signed   By: Trudie Reed M.D.   On: 08/17/2023 06:43     PROCEDURES:  Critical Care performed: Yes, see critical care procedure note(s)  CRITICAL CARE Performed by: Irean Hong   Total critical care time: 20 minutes  Critical care time was exclusive of separately billable procedures and treating other patients.  Critical care was necessary to treat or prevent imminent or life-threatening deterioration.  Critical care was time spent personally by me on the following activities:  development of treatment plan with patient and/or surrogate as well as nursing, discussions with consultants, evaluation of patient's response to treatment, examination of patient, obtaining history from patient or surrogate, ordering and performing treatments and interventions, ordering and review of laboratory studies, ordering and review of radiographic studies, pulse oximetry and re-evaluation of patient's condition.   Marland Kitchen1-3 Lead EKG Interpretation  Performed by: Irean Hong, MD Authorized by: Irean Hong, MD     Interpretation: abnormal     ECG rate:  105   ECG rate assessment: tachycardic     Rhythm: sinus tachycardia     Ectopy: none     Conduction: normal   Comments:     Patient placed on cardiac monitor to evaluate for arrhythmias    MEDICATIONS ORDERED IN ED: Medications  ipratropium-albuterol (DUONEB) 0.5-2.5 (3) MG/3ML nebulizer solution 3 mL (3 mLs Nebulization Given 08/17/23 0642)  methylPREDNISolone sodium succinate (SOLU-MEDROL) 125 mg/2 mL injection 125 mg (125 mg Intravenous Given 08/17/23 3244)  acetaminophen (TYLENOL) tablet 1,000 mg (1,000 mg Oral Given 08/17/23 0102)  sodium chloride 0.9 % bolus 1,000 mL (1,000 mLs Intravenous New Bag/Given 08/17/23 0639)  chlorpheniramine-HYDROcodone (TUSSIONEX) 10-8 MG/5ML suspension 5 mL (5 mLs Oral Given 08/17/23 7253)     IMPRESSION / MDM / ASSESSMENT AND PLAN / ED COURSE  I reviewed the triage vital signs and the nursing notes.                             58 year old female presenting with cough and shortness of breath. Differential includes, but is not limited to, viral syndrome, bronchitis including COPD exacerbation, pneumonia, reactive airway disease including asthma, CHF including exacerbation with or without pulmonary/interstitial edema, pneumothorax, ACS, thoracic trauma, and pulmonary embolism.  I personally reviewed patient's records and note a cardiology office visit from 07/12/2023 for follow-up LVH, hyperlipidemia,  pericardial effusion.  Patient's presentation is most consistent with acute complicated illness / injury requiring diagnostic workup.  The patient is on the cardiac monitor to evaluate for evidence of arrhythmia and/or significant heart rate changes.  Patient febrile, tachycardic and tachypneic.  Obtain ED sepsis workup, administer Tylenol for fever.  Administer 125 mg IV Solu-Medrol, DuoNeb, Tussionex, IV fluids and reassess.  Clinical Course as of 08/17/23 1610  Wed Aug 17, 2023  0651 Chest x-ray negative for infection.  Care be transferred to the oncoming provider at change of shift pending laboratory results and reassessment. [JS]    Clinical Course User Index [JS] Irean Hong, MD     FINAL CLINICAL IMPRESSION(S) / ED DIAGNOSES   Final diagnoses:  Fever, unspecified fever cause  Shortness of breath  COPD exacerbation (HCC)     Rx / DC Orders   ED Discharge Orders     None        Note:  This document was prepared using Dragon voice recognition software and may include unintentional dictation errors.   Irean Hong, MD 08/17/23 [phone]

## 2023-08-17 NOTE — ED Notes (Signed)
The pt was able to stand and pivot with assistance to the bedside commode. The pt then used a step stool to get back into bed without incident.

## 2023-08-18 ENCOUNTER — Other Ambulatory Visit: Payer: Self-pay

## 2023-08-18 DIAGNOSIS — J9621 Acute and chronic respiratory failure with hypoxia: Secondary | ICD-10-CM | POA: Diagnosis not present

## 2023-08-18 LAB — COMPREHENSIVE METABOLIC PANEL
ALT: 44 U/L (ref 0–44)
AST: 39 U/L (ref 15–41)
Albumin: 3.5 g/dL (ref 3.5–5.0)
Alkaline Phosphatase: 47 U/L (ref 38–126)
Anion gap: 8 (ref 5–15)
BUN: 14 mg/dL (ref 6–20)
CO2: 26 mmol/L (ref 22–32)
Calcium: 8.7 mg/dL — ABNORMAL LOW (ref 8.9–10.3)
Chloride: 102 mmol/L (ref 98–111)
Creatinine, Ser: 0.74 mg/dL (ref 0.44–1.00)
GFR, Estimated: >60 mL/min (ref >60)
Glucose, Bld: 148 mg/dL — ABNORMAL HIGH (ref 70–99)
Potassium: 4 mmol/L (ref 3.5–5.1)
Sodium: 136 mmol/L (ref 135–145)
Total Bilirubin: 0.6 mg/dL (ref 0.0–1.2)
Total Protein: 7.2 g/dL (ref 6.5–8.1)

## 2023-08-18 LAB — CBC
HCT: 44.4 % (ref 36.0–46.0)
Hemoglobin: 14.6 g/dL (ref 12.0–15.0)
MCH: 28.9 pg (ref 26.0–34.0)
MCHC: 32.9 g/dL (ref 30.0–36.0)
MCV: 87.7 fL (ref 80.0–100.0)
Platelets: 153 10*3/uL (ref 150–400)
RBC: 5.06 MIL/uL (ref 3.87–5.11)
RDW: 12.7 % (ref 11.5–15.5)
WBC: 7.3 10*3/uL (ref 4.0–10.5)
nRBC: 0 % (ref 0.0–0.2)

## 2023-08-18 LAB — GLUCOSE, CAPILLARY
Glucose-Capillary: 139 mg/dL — ABNORMAL HIGH (ref 70–99)
Glucose-Capillary: 163 mg/dL — ABNORMAL HIGH (ref 70–99)

## 2023-08-18 MED ORDER — IPRATROPIUM-ALBUTEROL 0.5-2.5 (3) MG/3ML IN SOLN: 3.0000 mL | Freq: Three times a day (TID) | RESPIRATORY_TRACT | Status: DC

## 2023-08-18 MED ORDER — PREDNISONE 20 MG PO TABS
40.0000 mg | ORAL_TABLET | Freq: Every day | ORAL | 0 refills | Status: AC
  Filled 2023-08-18: qty 8, 4d supply, fill #0

## 2023-08-18 MED ORDER — OSELTAMIVIR PHOSPHATE 75 MG PO CAPS
75.0000 mg | ORAL_CAPSULE | Freq: Two times a day (BID) | ORAL | 0 refills | Status: DC
  Filled 2023-08-18: qty 8, 4d supply, fill #0

## 2023-08-18 NOTE — TOC Transition Note (Signed)
Transition of Care [facility]) - Discharge Note   Patient Details  Name: Lindsey Benjamin MRN: [id number] Date of Birth: [birth year]  [facility]) CM/SW Contact:  Garret Reddish, RN Phone Number: 08/18/2023, 11:13 AM   Clinical Narrative:    Chart reviewed.  I have spoken to Mrs. Speece.  She reports that she lives at home with her husband.  She reports that she is able to get around her home with a rolling walker.  Mrs. Kassner informs me that she has home 02 with [facility].  She reports that her concentrator is broken and not able to produce the home 02.    I have asked [facility] Specialist to visit her home today to troubleshot and provide an alternate concentrator.  [facility] will arrange for a visit to patient's home today.    I have arranged for [facility] to transport patient home today.    I have informed staff nurse of the above information.    Final next level of care: Home/Self Care Barriers to Discharge: No Barriers Identified   Patient Goals and CMS Choice            Discharge Placement                    Patient and family notified of of transfer: 08/18/23  Discharge Plan and Services Additional resources added to the After Visit Summary for                  DME Arranged:  (Patient has home 02 via [facility] Specialist.)                    Social Drivers of Health (SDOH) Interventions SDOH Screenings   Food Insecurity: No Food Insecurity (08/17/2023)  Housing: Patient Unable To Answer (08/17/2023)  Transportation Needs: Unmet Transportation Needs (08/17/2023)  Utilities: At Risk (08/17/2023)  Alcohol Screen: Low Risk  (03/16/2023)  Depression (PHQ2-9): Low Risk  (03/16/2023)  Financial Resource Strain: Low Risk  (04/11/2023)   Received from [facility]  Physical Activity: Inactive (03/16/2023)  Social Connections: Moderately Integrated (03/16/2023)  Stress: No Stress Concern Present (03/16/2023)  Tobacco  Use: High Risk (08/17/2023)  Health Literacy: Adequate Health Literacy (03/16/2023)     Readmission Risk Interventions     No data to display

## 2023-08-18 NOTE — Discharge Summary (Signed)
Lindsey Benjamin JYN:[id number] DOB: 1966/04/11 DOA: 08/17/2023  PCP: Enid Baas, MD  Admit date: 08/17/2023 Discharge date: 08/18/2023  Time spent: 35 minutes  Recommendations for Outpatient Follow-up:  Pcp f/u     Discharge Diagnoses:  Principal Problem:   Acute on chronic respiratory failure with hypoxia [facility]) Active Problems:   Acute on chronic respiratory failure with hypoxia and hypercapnia (HCC)   NSTEMI (non-ST elevated myocardial infarction) (HCC)   Essential hypertension   Hypothyroidism   Type 2 diabetes mellitus without complication, without long-term current use of insulin (HCC)   Chronic heart failure with preserved ejection fraction (HFpEF) (HCC)   SIRS (systemic inflammatory response syndrome) (HCC)   Discharge Condition: improved  Diet recommendation: heart healthy  Filed Weights   08/17/23 1213 08/18/23 0449  Weight: 89.4 kg 92.9 kg    History of present illness:  From admission h and p Lindsey Benjamin is a 58 y.o. female with medical history significant of morbid obesity, severe OSA requiring BiPAP nightly at baseline, hypertension, type 2 diabetes, chronic hypoxic respiratory failure on 2 L oxygen supplementation continuously at baseline presenting with acute on chronic respiratory failure with hypoxia, COPD exacerbation, influenza A.  Patient reports increased work of breathing over the past 2 to 3 days.  Positive generalized malaise, cough wheezing increased sputum production.  Wears 2 to 3 L at home nasal cannula with BiPAP use at night.  Positive increased work of breathing despite home oxygen.  Also persistence of symptoms even with home inhaler use.  Still smoking up to 1/2 pack/day.  Is ready to quit.  No chest pain.  No abdominal pain.  No reported orthopnea or PND.  No reported lower extremity swelling.   Hospital Course:  Patient presents with one day cough and dyspnea. Found to be flu positive. Septic by fever and tachycardia (sepsis  physiology resolved). Tolerating PO, no AKI or other significant lab abnormalities. No focal infiltrate on cxr. Started on tamiflu as well as prednisone and azithromycin for copd exacerbation. Symptomatically much improved. Breathing comfortably on home 2 liters. Lungs clear and cough resolved. Will discharge to complete a course of tamiflu, prednisone, and azithromycin. Advise PCP f/u. Did have mild troponin elevation that trended down, no chest pain or ischemic changes on EKG, suspect this to be demand.   Procedures: none   Consultations: none  Discharge Exam: Vitals:   08/18/23 0727 08/18/23 0802  BP: (!) 165/74   Pulse: 66   Resp: 18   Temp: 98.4 F (36.9 C)   SpO2: 98% 98%    General: NAD Cardiovascular: RRR Respiratory: CTAB  Discharge Instructions   Discharge Instructions     Diet - low sodium heart healthy   Complete by: As directed    Increase activity slowly   Complete by: As directed       Allergies as of 08/18/2023       Reactions   Ceclor [cefaclor] Hives   Levofloxacin Rash   Lisinopril Cough   Ofloxacin Swelling   Penicillins Anaphylaxis   Ranitidine Hives   Tomato Rash        Medication List     TAKE these medications    acetaminophen 500 MG tablet Commonly known as: TYLENOL Take 500 mg by mouth every 6 (six) hours as needed.   albuterol 108 (90 Base) MCG/ACT inhaler Commonly known as: VENTOLIN HFA Inhale 2 puffs into the lungs 4 (four) times daily. What changed:  when to take this reasons to take this  amLODipine 2.5 MG tablet Commonly known as: NORVASC Take 1 tablet (2.5 mg total) by mouth daily.   Blood Pressure Kit USE AS DIRECTED DAILY.   Breztri Aerosphere 160-9-4.8 MCG/ACT Aero Generic drug: Budeson-Glycopyrrol-Formoterol Inhale 2 puffs into the lungs 2 (two) times daily.   furosemide 40 MG tablet Commonly known as: LASIX Take 40 mg by mouth daily.   gabapentin 600 MG tablet Commonly known as: NEURONTIN Take  (1/2) tablet (300 mg total) by mouth once daily at bedtime. What changed:  when to take this reasons to take this additional instructions   glipiZIDE 5 MG tablet Commonly known as: GLUCOTROL Take 1 tablet (5 mg total) by mouth daily before breakfast.   levothyroxine 175 MCG tablet Commonly known as: SYNTHROID Take 1 tablet (175 mcg total) by mouth daily before breakfast.   loperamide 2 MG capsule Commonly known as: IMODIUM Take 2 mg by mouth as needed for diarrhea or loose stools.   losartan 100 MG tablet Commonly known as: COZAAR Take 1 tablet (100 mg total) by mouth daily.   Multi-Vitamin tablet Take 1 tablet by mouth daily.   oseltamivir 75 MG capsule Commonly known as: TAMIFLU Take 1 capsule (75 mg total) by mouth 2 (two) times daily.   pantoprazole 40 MG tablet Commonly known as: PROTONIX Take 1 tablet (40 mg total) by mouth at bedtime. What changed:  when to take this reasons to take this   pravastatin 40 MG tablet Commonly known as: PRAVACHOL Take 1 tablet (40 mg total) by mouth daily at 6 PM.   predniSONE 20 MG tablet Commonly known as: DELTASONE Take 2 tablets (40 mg total) by mouth daily with breakfast for 4 days. Start taking on: August 19, 2023   Rightest GL300 Lancets Misc USE AS DIRECTED TO CHECK BLOOD SUGAR UP TO 4 TIMES DAILY.   Rightest GM550 Blood Glucose w/Device Kit USE AS DIRECTED TO CHECK BLOOD SUGAR UP TO 4 TIMES DAILY.   Rightest GS550 Blood Glucose test strip Generic drug: glucose blood USE AS DIRECTED TO CHECK BLOOD SUGAR UP TO 4 TIMES DAILY.   spironolactone 25 MG tablet Commonly known as: ALDACTONE Take 1 tablet (25 mg total) by mouth daily.   True Metrix Meter w/Device Kit Use as directed to check blood sugar.       Allergies  Allergen Reactions   Ceclor [Cefaclor] Hives   Levofloxacin Rash   Lisinopril Cough   Ofloxacin Swelling   Penicillins Anaphylaxis   Ranitidine Hives   Tomato Rash    Follow-up Information      Enid Baas, MD Follow up.   Specialty: Internal Medicine Contact information: [address] [place] [address] [phone]                  The results of significant diagnostics from this hospitalization (including imaging, microbiology, ancillary and laboratory) are listed below for reference.    Significant Diagnostic Studies: DG Chest Port 1 View Result Date: 08/17/2023 CLINICAL DATA:  58 year old female with history of shortness of breath. EXAM: PORTABLE CHEST 1 VIEW COMPARISON:  Chest x-ray 03/04/2023. FINDINGS: Lung volumes are normal. No consolidative airspace disease. No pleural effusions. No pneumothorax. No pulmonary nodule or mass noted. Pulmonary vasculature and the cardiomediastinal silhouette are within normal limits. IMPRESSION: No radiographic evidence of acute cardiopulmonary disease. Electronically Signed   By: Trudie Reed M.D.   On: 08/17/2023 06:43    Microbiology: Recent Results (from the past 240 hours)  Culture, blood (routine x 2)  Status: None (Preliminary result)   Collection Time: 08/17/23  6:13 AM   Specimen: BLOOD  Result Value Ref Range Status   Specimen Description BLOOD RIGHT ANTECUBITAL  Final   Special Requests   Final    BOTTLES DRAWN AEROBIC AND ANAEROBIC Blood Culture adequate volume   Culture   Final    NO GROWTH 1 DAY Performed at [facility], [address], [place], [place] [address]    Report Status PENDING  Incomplete  Resp panel by RT-PCR (RSV, Flu A&B, Covid) Anterior Nasal Swab     Status: Abnormal   Collection Time: 08/17/23  6:13 AM   Specimen: Anterior Nasal Swab  Result Value Ref Range Status   SARS Coronavirus 2 by RT PCR NEGATIVE NEGATIVE Final    Comment: (NOTE) SARS-CoV-2 target nucleic acids are NOT DETECTED.  The SARS-CoV-2 RNA is generally detectable in upper respiratory specimens during the acute phase of infection. The lowest concentration of SARS-CoV-2 viral copies  this assay can detect is 138 copies/mL. A negative result does not preclude SARS-Cov-2 infection and should not be used as the sole basis for treatment or other patient management decisions. A negative result may occur with  improper specimen collection/handling, submission of specimen other than nasopharyngeal swab, presence of viral mutation(s) within the areas targeted by this assay, and inadequate number of viral copies(<138 copies/mL). A negative result must be combined with clinical observations, patient history, and epidemiological information. The expected result is Negative.  Fact Sheet for Patients:  BloggerCourse.com  Fact Sheet for Healthcare Providers:  SeriousBroker.it  This test is no t yet approved or cleared by the [place] FDA and  has been authorized for detection and/or diagnosis of SARS-CoV-2 by FDA under an Emergency Use Authorization (EUA). This EUA will remain  in effect (meaning this test can be used) for the duration of the COVID-19 declaration under Section 564(b)(1) of the Act, 21 U.S.C.section 360bbb-3(b)(1), unless the authorization is terminated  or revoked sooner.       Influenza A by PCR POSITIVE (A) NEGATIVE Final   Influenza B by PCR NEGATIVE NEGATIVE Final    Comment: (NOTE) The Xpert Xpress SARS-CoV-2/FLU/RSV plus assay is intended as an aid in the diagnosis of influenza from Nasopharyngeal swab specimens and should not be used as a sole basis for treatment. Nasal washings and aspirates are unacceptable for Xpert Xpress SARS-CoV-2/FLU/RSV testing.  Fact Sheet for Patients: BloggerCourse.com  Fact Sheet for Healthcare Providers: SeriousBroker.it  This test is not yet approved or cleared by the [place] FDA and has been authorized for detection and/or diagnosis of SARS-CoV-2 by FDA under an Emergency Use Authorization (EUA). This  EUA will remain in effect (meaning this test can be used) for the duration of the COVID-19 declaration under Section 564(b)(1) of the Act, 21 U.S.C. section 360bbb-3(b)(1), unless the authorization is terminated or revoked.     Resp Syncytial Virus by PCR NEGATIVE NEGATIVE Final    Comment: (NOTE) Fact Sheet for Patients: BloggerCourse.com  Fact Sheet for Healthcare Providers: SeriousBroker.it  This test is not yet approved or cleared by the [place] FDA and has been authorized for detection and/or diagnosis of SARS-CoV-2 by FDA under an Emergency Use Authorization (EUA). This EUA will remain in effect (meaning this test can be used) for the duration of the COVID-19 declaration under Section 564(b)(1) of the Act, 21 U.S.C. section 360bbb-3(b)(1), unless the authorization is terminated or revoked.  Performed at [facility], [address]  [place], [place] [address]   Culture, blood (routine x 2)     Status: None (Preliminary result)   Collection Time: 08/17/23  6:14 AM   Specimen: BLOOD  Result Value Ref Range Status   Specimen Description BLOOD RIGHT HAND  Final   Special Requests   Final    BOTTLES DRAWN AEROBIC AND ANAEROBIC Blood Culture results may not be optimal due to an inadequate volume of blood received in culture bottles   Culture   Final    NO GROWTH 1 DAY Performed at [facility], [address] [place], [place] [address]    Report Status PENDING  Incomplete     Labs: Basic Metabolic Panel: Recent Labs  Lab 08/17/23 0830 08/18/23 0438  NA 134* 136  K 4.1 4.0  CL 97* 102  CO2 24 26  GLUCOSE 140* 148*  BUN 13 14  CREATININE 0.83 0.74  CALCIUM 8.5* 8.7*   Liver Function Tests: Recent Labs  Lab 08/17/23 0830 08/18/23 0438  AST 48* 39  ALT 43 44  ALKPHOS 50 47  BILITOT 1.1 0.6  PROT 7.1 7.2  ALBUMIN 3.7 3.5   No results for input(s): "LIPASE", "AMYLASE" in the last  168 hours. No results for input(s): "AMMONIA" in the last 168 hours. CBC: Recent Labs  Lab 08/17/23 0613 08/18/23 0438  WBC 7.2 7.3  NEUTROABS 5.9  --   HGB 16.8* 14.6  HCT 51.1* 44.4  MCV 88.4 87.7  PLT 179 153   Cardiac Enzymes: No results for input(s): "CKTOTAL", "CKMB", "CKMBINDEX", "TROPONINI" in the last 168 hours. BNP: BNP (last 3 results) Recent Labs    03/03/23 1227 08/17/23 0613  BNP 1,751.3* 192.1*    ProBNP (last 3 results) No results for input(s): "PROBNP" in the last 8760 hours.  CBG: Recent Labs  Lab 08/17/23 1210 08/17/23 1703 08/17/23 2152 08/18/23 0734  GLUCAP 195* 188* 173* 139*       Signed:  Silvano Bilis MD.  [facility] 08/18/2023, 8:32 AM

## 2023-08-18 NOTE — Plan of Care (Signed)
Problem: Coping: Goal: Ability to adjust to condition or change in health will improve Outcome: Progressing   Problem: Fluid Volume: Goal: Ability to maintain a balanced intake and output will improve Outcome: Progressing   Problem: Health Behavior/Discharge Planning: Goal: Ability to identify and utilize available resources and services will improve Outcome: Progressing

## 2023-08-18 NOTE — Plan of Care (Signed)
Problem: Education: Goal: Ability to describe self-care measures that may prevent or decrease complications (Diabetes Survival Skills Education) will improve Outcome: Adequate for Discharge Goal: Individualized Educational Video(s) Outcome: Adequate for Discharge   Problem: Coping: Goal: Ability to adjust to condition or change in health will improve Outcome: Adequate for Discharge   Problem: Fluid Volume: Goal: Ability to maintain a balanced intake and output will improve Outcome: Adequate for Discharge   Problem: Health Behavior/Discharge Planning: Goal: Ability to identify and utilize available resources and services will improve Outcome: Adequate for Discharge Goal: Ability to manage health-related needs will improve Outcome: Adequate for Discharge   Problem: Metabolic: Goal: Ability to maintain appropriate glucose levels will improve Outcome: Adequate for Discharge   Problem: Nutritional: Goal: Maintenance of adequate nutrition will improve Outcome: Adequate for Discharge Goal: Progress toward achieving an optimal weight will improve Outcome: Adequate for Discharge   Problem: Skin Integrity: Goal: Risk for impaired skin integrity will decrease Outcome: Adequate for Discharge   Problem: Tissue Perfusion: Goal: Adequacy of tissue perfusion will improve Outcome: Adequate for Discharge   Problem: Education: Goal: Knowledge of General Education information will improve Description: Including pain rating scale, medication(s)/side effects and non-pharmacologic comfort measures Outcome: Adequate for Discharge   Problem: Health Behavior/Discharge Planning: Goal: Ability to manage health-related needs will improve Outcome: Adequate for Discharge   Problem: Clinical Measurements: Goal: Ability to maintain clinical measurements within normal limits will improve Outcome: Adequate for Discharge Goal: Will remain free from infection Outcome: Adequate for Discharge Goal:  Diagnostic test results will improve Outcome: Adequate for Discharge Goal: Respiratory complications will improve Outcome: Adequate for Discharge Goal: Cardiovascular complication will be avoided Outcome: Adequate for Discharge   Problem: Activity: Goal: Risk for activity intolerance will decrease Outcome: Adequate for Discharge   Problem: Nutrition: Goal: Adequate nutrition will be maintained Outcome: Adequate for Discharge   Problem: Coping: Goal: Level of anxiety will decrease Outcome: Adequate for Discharge   Problem: Elimination: Goal: Will not experience complications related to bowel motility Outcome: Adequate for Discharge Goal: Will not experience complications related to urinary retention Outcome: Adequate for Discharge   Problem: Pain Managment: Goal: General experience of comfort will improve and/or be controlled Outcome: Adequate for Discharge   Problem: Safety: Goal: Ability to remain free from injury will improve Outcome: Adequate for Discharge   Problem: Skin Integrity: Goal: Risk for impaired skin integrity will decrease Outcome: Adequate for Discharge   Problem: Education: Goal: Knowledge of disease or condition will improve Outcome: Adequate for Discharge Goal: Knowledge of the prescribed therapeutic regimen will improve Outcome: Adequate for Discharge Goal: Individualized Educational Video(s) Outcome: Adequate for Discharge   Problem: Activity: Goal: Ability to tolerate increased activity will improve Outcome: Adequate for Discharge Goal: Will verbalize the importance of balancing activity with adequate rest periods Outcome: Adequate for Discharge   Problem: Respiratory: Goal: Ability to maintain a clear airway will improve Outcome: Adequate for Discharge Goal: Levels of oxygenation will improve Outcome: Adequate for Discharge Goal: Ability to maintain adequate ventilation will improve Outcome: Adequate for Discharge

## 2023-08-22 LAB — CULTURE, BLOOD (ROUTINE X 2)
Culture: NO GROWTH
Culture: NO GROWTH
Special Requests: ADEQUATE

## 2023-09-02 ENCOUNTER — Other Ambulatory Visit: Payer: Self-pay

## 2023-09-22 ENCOUNTER — Other Ambulatory Visit: Payer: Self-pay | Admitting: Internal Medicine

## 2023-09-22 DIAGNOSIS — Z1231 Encounter for screening mammogram for malignant neoplasm of breast: Secondary | ICD-10-CM

## 2023-09-22 NOTE — Progress Notes (Signed)
 Chief Complaint:  Annual Physical   Chief Complaint  Patient presents with  . Establish Care    Subjective:   Lindsey Benjamin is a 58 y.o. female in today for annual physical.  History of Present Illness Lindsey Benjamin is a 58 year old female with COPD who presents for establishment of care. She was referred by Darice Marina from [facility]. Lives at home with her husband.  She has 1 living son who is in [place] .  Friends and neighbors help around here as her husband is quite sick.  She has a history of chronic COPD and uses a BiPAP with a 6-liter bleed-in oxygen at night and a 2-liter portable oxygen if her levels drop below 88-89% during daytime.  She smokes half a pack of cigarettes daily. She experiences shortness of breath and uses a walker with a seat for stability and breathing support. She was hospitalized in August 2024 for respiratory failure, which led to a diagnosis of congestive heart failure.  She has heart issues and underwent a cardiac MRI. She has a cardiologist in [facility] and has had a cardiac catheterization in the past with no blockages found. She takes a diuretic prescribed by her cardiologist and reports frequent urination due to this medication.  She has a history of sleep apnea managed by a neurologist and uses a BiPAP machine. 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 No falls at home.  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 She reports a phonics issue where hearing aids do not help with understanding speech. She has seen an ENT specialist   She has diabetes and takes glipizide . She reports cataracts and difficulty focusing with her bifocals. She has not had a recent eye exam due to lack of insurance but now has Medicaid coverage.  She has a deviated septum causing nasal blockage. She  reports a history of bronchial pneumonia as an infant, requiring a tracheostomy.  She was hospitalized overnight in February for influenza A despite vaccination and was treated with Tamiflu  and prednisone .   Current Outpatient Medications  Medication Sig Dispense Refill  . acetaminophen  (TYLENOL ) 500 MG tablet Take 500 mg by mouth every 6 (six) hours as needed    . albuterol  MDI, PROVENTIL , VENTOLIN , PROAIR , HFA 90 mcg/actuation inhaler Inhale 2 inhalations into the lungs 4 (four) times daily    . amLODIPine  (NORVASC ) 5 MG tablet Take 5 mg by mouth once daily    . blood glucose diagnostic (RIGHTEST GS550 TEST STRIPS) test strip USE AS DIRECTED TO CHECK BLOOD SUGAR UP TO 4 TIMES DAILY.    . diphenhydrAMINE (ALLERGY RELIEF,DIPHENHYDRAMIN,) 25 mg capsule Take 25 mg by mouth as needed for Allergies    . FUROsemide  (LASIX ) 40 MG tablet Take 40 mg by mouth once daily    . gabapentin  (NEURONTIN ) 600 MG tablet Take 300 mg by mouth    . glipiZIDE  (GLUCOTROL ) 5 MG tablet Take 5 mg by mouth    . losartan  (COZAAR ) 100 MG tablet Take 100 mg by mouth once daily    . multivit-min/iron/FA/vit K/lut (CENTRUM SILVER WOMEN ORAL) Take by mouth    . OXYGEN-AIR DELIVERY SYSTEMS MISC Place 3 L/min into one nostril    . pantoprazole  (PROTONIX ) 40 MG DR tablet Take 40 mg by mouth at bedtime    . pravastatin  (PRAVACHOL ) 40  MG tablet Take 40 mg by mouth    . spironolactone  (ALDACTONE ) 25 MG tablet Take 1 tablet by mouth once daily    . varenicline  tartrate (CHANTIX ) 0.5 MG tablet Take 1 tablet (0.5 mg total) by mouth 2 (two) times daily 60 tablet 3  . levothyroxine  (SYNTHROID ) 200 MCG tablet Take 1 tablet (200 mcg total) by mouth once daily 90 tablet 0  . tirzepatide (MOUNJARO) 2.5 mg/0.5 mL pen injector Inject 0.5 mLs (2.5 mg total) subcutaneously once a week 2 mL 1   No current facility-administered medications for this visit.    Allergies as of 09/21/2023 - Reviewed 09/21/2023  Allergen Reaction Noted  .  Cefaclor Hives 12/16/2014  . Levofloxacin Other (See Comments) and Rash 02/07/2018  . Lisinopril Cough 02/23/2017  . Ofloxacin Swelling 07/19/2019  . Penicillins Anaphylaxis 05/14/2014  . Ranitidine Hives 06/09/2017  . Tomato Rash 05/11/2016    Past Medical History:  Diagnosis Date  . COPD (chronic obstructive pulmonary disease) (CMS/HHS-HCC)   . Diabetes mellitus without complication (CMS/HHS-HCC)   . GERD (gastroesophageal reflux disease)   . High cholesterol   . Hypertension   . Hypothyroid     History reviewed. No pertinent surgical history.   Family History  Problem Relation Name Age of Onset  . Heart disease Mother    . Heart disease Father    . Obesity Father    . High blood pressure (Hypertension) Sister    . High blood pressure (Hypertension) Brother      Social History:  reports that she has been smoking cigarettes. She started smoking about 42 years ago. She has a 21.1 pack-year smoking history. She has never used smokeless tobacco. She reports that she does not currently use alcohol. She reports that she does not use drugs.   Goals Addressed             This Visit's Progress   . Follow my doctor's care plan   On track       Results for orders placed or performed in visit on 09/21/23  CBC w/auto Differential (5 Part)  Result Value Ref Range   WBC (White Blood Cell Count) 6.6 4.1 - 10.2 10^3/uL   RBC (Red Blood Cell Count) 5.21 4.04 - 5.48 10^6/uL   Hemoglobin 15.4 (H) 12.0 - 15.0 gm/dL   Hematocrit 54.2 64.9 - 47.0 %   MCV (Mean Corpuscular Volume) 87.7 80.0 - 100.0 fl   MCH (Mean Corpuscular Hemoglobin) 29.6 27.0 - 31.2 pg   MCHC (Mean Corpuscular Hemoglobin Concentration) 33.7 32.0 - 36.0 gm/dL   Platelet Count 804 849 - 450 10^3/uL   RDW-CV (Red Cell Distribution Width) 12.9 11.6 - 14.8 %   MPV (Mean Platelet Volume) 11.7 9.4 - 12.4 fl   Neutrophils 4.05 1.50 - 7.80 10^3/uL   Lymphocytes 1.58 1.00 - 3.60 10^3/uL   Monocytes 0.80 0.00 - 1.50  10^3/uL   Eosinophils 0.14 0.00 - 0.55 10^3/uL   Basophils 0.05 0.00 - 0.09 10^3/uL   Neutrophil % 61.0 32.0 - 70.0 %   Lymphocyte % 23.8 10.0 - 50.0 %   Monocyte % 12.0 4.0 - 13.0 %   Eosinophil % 2.1 1.0 - 5.0 %   Basophil% 0.8 0.0 - 2.0 %   Immature Granulocyte % 0.3 <=0.7 %   Immature Granulocyte Count 0.02 <=0.06 10^3/L  Comprehensive Metabolic Panel (CMP)  Result Value Ref Range   Glucose 65 (L) 70 - 110 mg/dL   Sodium 862 863 - 854 mmol/L  Potassium 4.0 3.6 - 5.1 mmol/L   Chloride 97 97 - 109 mmol/L   Carbon Dioxide (CO2) 32.2 (H) 22.0 - 32.0 mmol/L   Urea Nitrogen (BUN) 13 7 - 25 mg/dL   Creatinine 1.0 0.6 - 1.1 mg/dL   Glomerular Filtration Rate (eGFR) 64 >60 mL/min/1.73sq m   Calcium 9.5 8.7 - 10.3 mg/dL   AST  36 8 - 39 U/L   ALT  33 5 - 38 U/L   Alk Phos (alkaline Phosphatase) 67 34 - 104 U/L   Albumin 4.5 3.5 - 4.8 g/dL   Bilirubin, Total 0.7 0.3 - 1.2 mg/dL   Protein, Total 7.7 6.1 - 7.9 g/dL   A/G Ratio 1.4 1.0 - 5.0 gm/dL  Hemoglobin J8R  Result Value Ref Range   Hemoglobin A1C 7.1 (H) 4.2 - 5.6 %   Average Blood Glucose (Calc) 157 mg/dL   Narrative   Normal Range:    4.2 - 5.6% Increased Risk:  5.7 - 6.4% Diabetes:        >= 6.5% Glycemic Control for adults with diabetes:  <7%    Lipid Panel w/calc LDL  Result Value Ref Range   Cholesterol, Total 174 100 - 200 mg/dL   Triglyceride 749 (H) 35 - 199 mg/dL   HDL (High Density Lipoprotein) Cholesterol 43.6 35.0 - 85.0 mg/dL   LDL Calculated 80 0 - 130 mg/dL   VLDL Cholesterol 50 mg/dL   Cholesterol/HDL Ratio 4.0   Microalbumin/Creatinine Ratio, Random Urine  Result Value Ref Range   Creatinine, Random Urine 19.4 (L) 37.0 - 250.0 mg/dL   Urine Albumin, Random 16   mg/L   Urine Albumin/Creatinine Ratio 82.5 (H) <30.0 ug/mg  Vitamin D , 25-Hydroxy - [facility]  Result Value Ref Range   Vitamin D , 25-Hydroxy - [facility] 23.7 (L) 30.0 - 100.0 ng/mL   Narrative   Performed at:  [address] [employer] [address], [place], [place]  [id number] Lab Director: Frankey Sas MD, Phone:  [phone]  Urinalysis w/Microscopic  Result Value Ref Range   Color Colorless Colorless, Straw, Light Yellow, Yellow, Dark Yellow   Clarity Clear Clear   Specific Gravity 1.004 (L) 1.005 - 1.030   pH, Urine 6.0 5.0 - 8.0   Protein, Urinalysis Negative Negative mg/dL   Glucose, Urinalysis Negative Negative mg/dL   Ketones, Urinalysis Negative Negative mg/dL   Blood, Urinalysis Trace (!) Negative   Nitrite, Urinalysis Negative Negative   Leukocyte Esterase, Urinalysis Negative Negative   Bilirubin, Urinalysis Negative Negative   Urobilinogen, Urinalysis 0.2 0.2 - 1.0 mg/dL   WBC, UA <1 <=5 /hpf   Red Blood Cells, Urinalysis <1 <=3 /hpf   Bacteria, Urinalysis 0-5 0 - 5 /hpf   Squamous Epithelial Cells, Urinalysis 1 /hpf  Thyroid  Stimulating-Hormone (TSH)  Result Value Ref Range   Thyroid  Stimulating Hormone (TSH) 19.187 (H) 0.450-5.330 uIU/ml uIU/mL  Thyroxine (T4), Free  Result Value Ref Range   Thyroxine, Free (FT4) 0.76 0.66 - 1.14 ng/dL   Narrative   This reference range applies to non-pregnant individuals 18 or older. Contact Laboratory for reference ranges for pregnant patients.  Biotin ingestion may interfere with free T4 and Total T3 tests. If the results are inconsistent with the TSH level, previous test results, or the clinical presentation, then consider biotin interference. If needed, order repeat testing after stopping biotin.  Vitamin B12  Result Value Ref Range   Vitamin B12 285 (L) >300 pg/mL   Narrative   <200 pg/mL:  Low, consistent with Vitamin B12 Deficiency 200-300 pg/mL: Borderline, possible Vitamin B12 Deficiency >300 pg/mL:    Normal.  Vitamin B12 Deficiency is unlikely       ROS:  General: No fever, chills or recent illness. No change in weight Skin:   No skin lesions, growths, masses, rashes, pruritus  HEENT: No change in vision.  Chronically decreased hearing and  especially clarity of speech.. No pain or difficulty with swallowing Respiratory: Chronic cough and shortness of breath CV:  No chest pain or palpitations GI:  No pain, dyspepsia or change in bowel habits GU:  No dysuria, frequency, or hesitancy MSK:  Joint pains and lower back pain Neurological: No headaches, changes in mental status, loss of sensation or strength  Endocrine:  No heat or cold intolerance, polydipsia, polyuria Psychological: Denies insomnia or depression symptoms  Objective:   Body mass index is 38.87 kg/m.  BP 138/70   Pulse 88   Ht 147.3 cm (4' 10)   Wt 84.4 kg (186 lb)   LMP  (LMP Unknown)   SpO2 94%   BMI 38.87 kg/m   General: WD/WN short stature female, in no acute distress HEENT: Pupils equal and round, EOMI.  OP moist without lesions Neck: supple, trachea midline; no thyromegaly Chest: normal to palpation Lungs: Scattered wheezes especially posterior bases, no use of accessory muscles to breathe Cardiac:  Regular rate and rhythm without murmur, gallops, or rubs Vascular: No carotid bruit and radials 2+; distal pulses 2+ Abdominal:soft, nontender, positive bowel sounds.  No guarding or rebound tenderness Extremities:  No clubbing, cyanosis or edema Neuro: CN grossly intact.  Gait is unsteady and uses walker at baseline no acute motor or sensory deficits Dermatologic: no significant rashes or nodules Lymph: no cervical or supraclavicular lymphadenopathy   Assessment/Plan:   Annual physical exam  (primary encounter diagnosis) Chronic heart failure with preserved ejection fraction (HFpEF) (CMS/HHS-HCC) Chronic respiratory failure with hypercapnia (CMS/HHS-HCC) Type 2 diabetes mellitus without complication, without long-term current use of insulin  (CMS/HHS-HCC) Acquired hypothyroidism Mixed conductive and sensorineural hearing loss of both ears Encounter for screening mammogram for breast cancer Screening for colon cancer  Assessment &  Plan   Establish care and Annual physical- -Addressed current concerns -Discussed past history and reviewed old records -Previous labs reviewed and new labs ordered -Discussed diet activity and living situation -Addressed health maintenance -Immunizations reviewed -Mammogram referral sent -Colon cancer screening discussed and Cologuard has been ordered. -Encouraged to receive the shingles vaccine from pharmacy and RSV vaccine this fall  2.  Chronic respiratory failure secondary to chronic Obstructive Pulmonary Disease (COPD)- -following with pulmonology at [facility] -Uses oxygen and BiPAP at nighttime.  Uses portable oxygen as needed with exertion during the daytime -Continues to smoke almost half pack per day -Plans to restart her Chantix  which has helped her in the past. -CT lung cancer screening has been done by her pulmonologist. -Also has obesity hypoventilation and weight loss will be beneficial  3.  Type 2 diabetes- -Currently on glipizide  -Labs ordered today -Continue to monitor blood glucose at home -If eligible, will start GLP-1 agonist for cardioprotection and also for weight loss which might help with her breathing -Low-carb diet, diabetes education provided -Ophthalmology referral provided  4.  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 -They are considering Jardiance for her in the future -Small pericardial effusion on a previous echocardiogram -Blood pressure control is recommended. -Continue to monitor with cardiology  5.  Hypertension- -Well-controlled today -Low-salt diet -Continue  BiPAP at bedtime -On losartan , amlodipine  and spironolactone .  6.  Obesity- -BMI of 38.8 -Check thyroid  labs.  Patient on Synthroid  -Will be a candidate for GLP-1 agonist after diabetes labs will be checked. -Diet and lifestyle changes.   7.  Mixed conductive and sensorineural hearing loss- -Continue to follow-up with ENT. -Not a  candidate for hearing aids as she has trouble with the clarity of the speech and nothing volume according to patient.    Follow-up with me in 3 months for follow-up of hypertension, heart failure, COPD and diabetes.  Sooner for acute issues       LAVENIA BEAVER, MD  Portions of this note were created using dictation software and may contain typographical errors.

## 2023-10-07 LAB — COLOGUARD: COLOGUARD: NEGATIVE

## 2023-12-21 ENCOUNTER — Other Ambulatory Visit: Payer: Self-pay

## 2023-12-22 NOTE — Progress Notes (Signed)
 Chief Complaint:   Chief Complaint  Patient presents with  . Follow-up    Subjective:   Lindsey Benjamin is a 58 y.o. female in today for follow up today  History of Present Illness Lindsey Benjamin is a 58 year old female who presents with worsening leg swelling and swallowing issues.  Patient has known history of COPD, using his CPAP, hypertrophic cardiomyopathy following with cardiologist with chronic diastolic CHF, sensorineural hearing loss and diabetes mellitus.  After labs from last visit, she has been started on Ozempic.  She has delayed starting the Ozempic because of the injection as she was worried about it.  When she found it was painless, she continued it.  She is on the 0.25 mg dosage.  Due for labs today.  She experiences worsening swelling in her legs and feet, which has become painful and tight. The swelling persists despite taking her prescribed diuretic, furosemide , and has worsened over time. She is concerned about the fluid retention and its impact on her daily life, mentioning that her legs hurt due to the swelling.  .  She has a history of sleep apnea and is under the care of a neurologist who has requested iron and ferritin level tests. She is also taking vitamin supplements, including B12 and D3, but sometimes forgets to take them as they are not part of her regular medication regimen.  She experiences swallowing difficulties, described as 'delayed swallowing' associated with a hiatal hernia and acid reflux.  She feels like food is stuck in the distal esophagus and sometimes pills.  She takes Protonix  for her reflux as prescribed.  But certain foods have exacerbated her symptoms.  Occasionally does get the typical heartburn symptoms after eating. She has not noticed any particular increase in the symptoms since starting the Ozempic.  Patient does have a hospital bed at home to keep her head of the bed elevated     Current Outpatient Medications  Medication Sig  Dispense Refill  . acetaminophen  (TYLENOL ) 500 MG tablet Take 500 mg by mouth every 6 (six) hours as needed    . albuterol  MDI, PROVENTIL , VENTOLIN , PROAIR , HFA 90 mcg/actuation inhaler Inhale 2 inhalations into the lungs 4 (four) times daily    . amLODIPine  (NORVASC ) 5 MG tablet Take 5 mg by mouth once daily    . blood glucose diagnostic (RIGHTEST GS550 TEST STRIPS) test strip USE AS DIRECTED TO CHECK BLOOD SUGAR UP TO 4 TIMES DAILY.    . diphenhydrAMINE (ALLERGY RELIEF,DIPHENHYDRAMIN,) 25 mg capsule Take 25 mg by mouth as needed for Allergies    . FUROsemide  (LASIX ) 40 MG tablet Take 40 mg by mouth once daily    . gabapentin  (NEURONTIN ) 600 MG tablet Take 300 mg by mouth    . glipiZIDE  (GLUCOTROL ) 5 MG tablet Take 1 tablet (5 mg total) by mouth once daily 90 tablet 1  . levothyroxine  (SYNTHROID ) 200 MCG tablet Take 1 tablet (200 mcg total) by mouth once daily 90 tablet 0  . losartan  (COZAAR ) 100 MG tablet Take 100 mg by mouth once daily    . multivit-min/iron/FA/vit K/lut (CENTRUM SILVER WOMEN ORAL) Take by mouth    . OXYGEN-AIR DELIVERY SYSTEMS MISC Place 3 L/min into one nostril    . pantoprazole  (PROTONIX ) 40 MG DR tablet Take 40 mg by mouth at bedtime    . pravastatin  (PRAVACHOL ) 40 MG tablet Take 40 mg by mouth    . semaglutide (OZEMPIC) 0.25 mg or 0.5 mg (2 mg/3 mL) pen injector Inject  0.375 mLs (0.25 mg total) subcutaneously once a week 1.5 mL 1  . spironolactone  (ALDACTONE ) 25 MG tablet Take 1 tablet (25 mg total) by mouth once daily 90 tablet 1  . varenicline  tartrate (CHANTIX ) 0.5 MG tablet Take 1 tablet (0.5 mg total) by mouth 2 (two) times daily 60 tablet 3   No current facility-administered medications for this visit.    Allergies as of 12/22/2023 - Reviewed 12/22/2023  Allergen Reaction Noted  . Cefaclor Hives 12/16/2014  . Levofloxacin Other (See Comments) and Rash 02/07/2018  . Lisinopril Cough 02/23/2017  . Ofloxacin Swelling 07/19/2019  . Penicillins Anaphylaxis  05/14/2014  . Ranitidine Hives 06/09/2017  . Tomato Rash 05/11/2016    Past Medical History:  Diagnosis Date  . COPD (chronic obstructive pulmonary disease) (CMS/HHS-HCC)   . Diabetes mellitus without complication (CMS/HHS-HCC)   . GERD (gastroesophageal reflux disease)   . High cholesterol   . Hypertension   . Hypothyroid     History reviewed. No pertinent surgical history.   Family History  Problem Relation Name Age of Onset  . Heart disease Mother    . Heart disease Father    . Obesity Father    . High blood pressure (Hypertension) Sister    . High blood pressure (Hypertension) Brother      Social History:  reports that she has been smoking cigarettes. She started smoking about 42 years ago. She has a 21.2 pack-year smoking history. She has never used smokeless tobacco. She reports that she does not currently use alcohol. She reports that she does not use drugs.  Results for orders placed or performed in visit on 09/21/23  CBC w/auto Differential (5 Part)  Result Value Ref Range   WBC (White Blood Cell Count) 6.6 4.1 - 10.2 10^3/uL   RBC (Red Blood Cell Count) 5.21 4.04 - 5.48 10^6/uL   Hemoglobin 15.4 (H) 12.0 - 15.0 gm/dL   Hematocrit 54.2 64.9 - 47.0 %   MCV (Mean Corpuscular Volume) 87.7 80.0 - 100.0 fl   MCH (Mean Corpuscular Hemoglobin) 29.6 27.0 - 31.2 pg   MCHC (Mean Corpuscular Hemoglobin Concentration) 33.7 32.0 - 36.0 gm/dL   Platelet Count 804 849 - 450 10^3/uL   RDW-CV (Red Cell Distribution Width) 12.9 11.6 - 14.8 %   MPV (Mean Platelet Volume) 11.7 9.4 - 12.4 fl   Neutrophils 4.05 1.50 - 7.80 10^3/uL   Lymphocytes 1.58 1.00 - 3.60 10^3/uL   Monocytes 0.80 0.00 - 1.50 10^3/uL   Eosinophils 0.14 0.00 - 0.55 10^3/uL   Basophils 0.05 0.00 - 0.09 10^3/uL   Neutrophil % 61.0 32.0 - 70.0 %   Lymphocyte % 23.8 10.0 - 50.0 %   Monocyte % 12.0 4.0 - 13.0 %   Eosinophil % 2.1 1.0 - 5.0 %   Basophil% 0.8 0.0 - 2.0 %   Immature Granulocyte % 0.3 <=0.7 %    Immature Granulocyte Count 0.02 <=0.06 10^3/L  Comprehensive Metabolic Panel (CMP)  Result Value Ref Range   Glucose 65 (L) 70 - 110 mg/dL   Sodium 862 863 - 854 mmol/L   Potassium 4.0 3.6 - 5.1 mmol/L   Chloride 97 97 - 109 mmol/L   Carbon Dioxide (CO2) 32.2 (H) 22.0 - 32.0 mmol/L   Urea Nitrogen (BUN) 13 7 - 25 mg/dL   Creatinine 1.0 0.6 - 1.1 mg/dL   Glomerular Filtration Rate (eGFR) 64 >60 mL/min/1.73sq m   Calcium 9.5 8.7 - 10.3 mg/dL   AST  36 8 - 39  U/L   ALT  33 5 - 38 U/L   Alk Phos (alkaline Phosphatase) 67 34 - 104 U/L   Albumin 4.5 3.5 - 4.8 g/dL   Bilirubin, Total 0.7 0.3 - 1.2 mg/dL   Protein, Total 7.7 6.1 - 7.9 g/dL   A/G Ratio 1.4 1.0 - 5.0 gm/dL  Hemoglobin J8R  Result Value Ref Range   Hemoglobin A1C 7.1 (H) 4.2 - 5.6 %   Average Blood Glucose (Calc) 157 mg/dL   Narrative   Normal Range:    4.2 - 5.6% Increased Risk:  5.7 - 6.4% Diabetes:        >= 6.5% Glycemic Control for adults with diabetes:  <7%    Lipid Panel w/calc LDL  Result Value Ref Range   Cholesterol, Total 174 100 - 200 mg/dL   Triglyceride 749 (H) 35 - 199 mg/dL   HDL (High Density Lipoprotein) Cholesterol 43.6 35.0 - 85.0 mg/dL   LDL Calculated 80 0 - 130 mg/dL   VLDL Cholesterol 50 mg/dL   Cholesterol/HDL Ratio 4.0   Microalbumin/Creatinine Ratio, Random Urine  Result Value Ref Range   Creatinine, Random Urine 19.4 (L) 37.0 - 250.0 mg/dL   Urine Albumin, Random 16   mg/L   Urine Albumin/Creatinine Ratio 82.5 (H) <30.0 ug/mg  Vitamin D , 25-Hydroxy - [facility]  Result Value Ref Range   Vitamin D , 25-Hydroxy - [facility] 23.7 (L) 30.0 - 100.0 ng/mL   Narrative   Performed at:  [address] [employer] [address], [place], [place]  [id number] Lab Director: Frankey Sas MD, Phone:  [phone]  Urinalysis w/Microscopic  Result Value Ref Range   Color Colorless Colorless, Straw, Light Yellow, Yellow, Dark Yellow   Clarity Clear Clear   Specific Gravity 1.004 (L) 1.005 - 1.030   pH,  Urine 6.0 5.0 - 8.0   Protein, Urinalysis Negative Negative mg/dL   Glucose, Urinalysis Negative Negative mg/dL   Ketones, Urinalysis Negative Negative mg/dL   Blood, Urinalysis Trace (!) Negative   Nitrite, Urinalysis Negative Negative   Leukocyte Esterase, Urinalysis Negative Negative   Bilirubin, Urinalysis Negative Negative   Urobilinogen, Urinalysis 0.2 0.2 - 1.0 mg/dL   WBC, UA <1 <=5 /hpf   Red Blood Cells, Urinalysis <1 <=3 /hpf   Bacteria, Urinalysis 0-5 0 - 5 /hpf   Squamous Epithelial Cells, Urinalysis 1 /hpf  Thyroid  Stimulating-Hormone (TSH)  Result Value Ref Range   Thyroid  Stimulating Hormone (TSH) 19.187 (H) 0.450-5.330 uIU/ml uIU/mL  Thyroxine (T4), Free  Result Value Ref Range   Thyroxine, Free (FT4) 0.76 0.66 - 1.14 ng/dL   Narrative   This reference range applies to non-pregnant individuals 18 or older. Contact Laboratory for reference ranges for pregnant patients.  Biotin ingestion may interfere with free T4 and Total T3 tests. If the results are inconsistent with the TSH level, previous test results, or the clinical presentation, then consider biotin interference. If needed, order repeat testing after stopping biotin.  Vitamin B12  Result Value Ref Range   Vitamin B12 285 (L) >300 pg/mL   Narrative   <200 pg/mL:    Low, consistent with Vitamin B12 Deficiency 200-300 pg/mL: Borderline, possible Vitamin B12 Deficiency >300 pg/mL:    Normal.  Vitamin B12 Deficiency is unlikely   Cologuard Facilities manager)  Result Value Ref Range   Cologuard Negative       ROS:  General: No fever, chills or recent illness. No change in weight HEENT: No change in vision or hearing. No pain but  difficulty swallowing Respiratory: No cough or shortness of breath CV:  No chest pain or palpitations GI:  No pain, dyspepsia or change in bowel habits GU:  No dysuria, frequency, or hesitancy MSK:  No joint pain or injury, lower extremity edema Neurological: No headaches,  changes in mental status, loss of sensation or strength  Psychological: No anxiety insomnia or depression  Objective:   There is no height or weight on file to calculate BMI.  LMP  (LMP Unknown)   General: WD/WN female, in no acute distress HEENT: Pupils equal and round, EOMI. oral mucosa moist.  Oropharynx clear. Neck: supple, trachea midline; no thyromegaly Respiratory:clear to auscultation.  No dullness to percussion.  No use of accessory muscles. Cardiac:  Regular rate and rhythm without murmur, gallops, or rubs Abdominal:soft, nontender, positive bowel sounds.  No organomegaly. Musculoskeletal:  No clubbing, cyanosis, 3+ edema of both lower extremities without any cellulitis changes but tenderness on palpation noted Neuro: CN grossly intact.  No acute decrease in sensation in the upper and lower extremities bilaterally. Lymph: no cervical or supraclavicular lymphadenopathy   Assessment/Plan:   Type 2 diabetes mellitus without complication, without long-term current use of insulin  (CMS/HHS-HCC)  (primary encounter diagnosis) Acquired hypothyroidism Chronic heart failure with preserved ejection fraction (HFpEF) (CMS/HHS-HCC) Mixed conductive and sensorineural hearing loss of both ears  Assessment & Plan   1.  Bilateral lower extremity edema- - Known history of heart failure, mostly diastolic heart failure - Currently on furosemide  and Aldactone  - Given the 3+ edema, labs ordered. - Switched over her furosemide  to torsemide just for 10 days and monitor - Recently seen by cardiology as well.  No recent echocardiogram ordered.  Monitor closely.  Known history of small pericardial effusion in the past. -Low-salt diet recommended.   2. Gastroesophageal Reflux Disease (GERD) with Hiatal Hernia -Continue Protonix . - Monitor if Ozempic makes symptoms worse. - Advise on dietary modifications: small portion meals, avoid trigger foods - Ensure the head of the bed is elevated during  sleep  3.  Esophageal dysphagia- - Barium swallow study has been ordered  4. Type 2 Diabetes Mellitus -Dietary education, low-carb diet - On glipizide . - Increase Ozempic to 0.5 mg subcu weekly - Cardiology was planning to start Jardiance.  If Bernadine is started, we can cut back on the glipizide  down the line. - Continue with the eye exams    5.  Hypothyroidism- - Continue Synthroid . - Will check labs today as hypothyroidism can also worsen lower extremity edema.  6.  Vitamin D  and B12 deficiency- - Continue regular supplements.     Follow-up in 4 to 6 weeks for follow-up of lower extremity edema, dysphagia, diabetes      Goals     . Follow my doctor's care plan        LAVENIA BEAVER, MD  Portions of this note were created using dictation software and may contain typographical errors.

## 2023-12-27 ENCOUNTER — Other Ambulatory Visit: Payer: Self-pay | Admitting: Internal Medicine

## 2023-12-27 DIAGNOSIS — E119 Type 2 diabetes mellitus without complications: Secondary | ICD-10-CM

## 2024-01-03 ENCOUNTER — Ambulatory Visit
Admission: RE | Admit: 2024-01-03 | Discharge: 2024-01-03 | Disposition: A | Discharge status: Home / Self Care | Source: Ambulatory Visit | Attending: Internal Medicine | Admitting: Internal Medicine

## 2024-01-03 DIAGNOSIS — E119 Type 2 diabetes mellitus without complications: Secondary | ICD-10-CM | POA: Diagnosis present

## 2024-03-02 ENCOUNTER — Other Ambulatory Visit: Payer: Self-pay

## 2024-03-28 ENCOUNTER — Encounter

## 2024-04-19 ENCOUNTER — Ambulatory Visit
Admission: RE | Admit: 2024-04-19 | Discharge: 2024-04-19 | Disposition: A | Discharge status: Home / Self Care | Source: Ambulatory Visit | Attending: Internal Medicine | Admitting: Internal Medicine

## 2024-04-19 DIAGNOSIS — Z1231 Encounter for screening mammogram for malignant neoplasm of breast: Secondary | ICD-10-CM | POA: Insufficient documentation

## 2024-06-05 ENCOUNTER — Other Ambulatory Visit: Payer: Self-pay | Admitting: Podiatry

## 2024-06-15 ENCOUNTER — Other Ambulatory Visit: Payer: Self-pay

## 2024-06-15 ENCOUNTER — Inpatient Hospital Stay: Admission: RE | Admit: 2024-06-15 | Discharge: 2024-06-15 | Attending: Podiatry | Admitting: Podiatry

## 2024-06-15 VITALS — Ht <= 58 in | Wt 181.0 lb

## 2024-06-15 DIAGNOSIS — I5032 Chronic diastolic (congestive) heart failure: Secondary | ICD-10-CM

## 2024-06-15 DIAGNOSIS — Z01810 Encounter for preprocedural cardiovascular examination: Secondary | ICD-10-CM

## 2024-06-15 DIAGNOSIS — Q669 Congenital deformity of feet, unspecified, unspecified foot: Secondary | ICD-10-CM

## 2024-06-15 DIAGNOSIS — Z01812 Encounter for preprocedural laboratory examination: Secondary | ICD-10-CM

## 2024-06-15 DIAGNOSIS — J449 Chronic obstructive pulmonary disease, unspecified: Secondary | ICD-10-CM

## 2024-06-15 DIAGNOSIS — I214 Non-ST elevation (NSTEMI) myocardial infarction: Secondary | ICD-10-CM

## 2024-06-15 DIAGNOSIS — Z01818 Encounter for other preprocedural examination: Secondary | ICD-10-CM

## 2024-06-15 DIAGNOSIS — I421 Obstructive hypertrophic cardiomyopathy: Secondary | ICD-10-CM

## 2024-06-15 DIAGNOSIS — E119 Type 2 diabetes mellitus without complications: Secondary | ICD-10-CM

## 2024-06-15 NOTE — Progress Notes (Signed)
 Pt states that after surgery she will be alone and have no one to stay with her the 1st 24 hours after surgery. I told pt that anesthesia requires you to have someone with you the 1st 24 hours after surgery. I informed pt she needs to call Dr Layla office today and let them know of this. Pt states she would and will see if maybe she can get admitted overnight after her toe amputation.

## 2024-06-15 NOTE — Patient Instructions (Addendum)
 Your procedure is scheduled on:06-22-24 Friday Report to the Registration Desk on the 1st floor of the [facility].Then proceed to the 2nd floor surgery desk To find out your arrival time, please call [phone] between 1PM - 3PM on:07-01-24 Thursday If your arrival time is 6:00 am, do not arrive before that time as the [facility] entrance doors do not open until 6:00 am.  REMEMBER: Instructions that are not followed completely may result in serious medical risk, up to and including death; or upon the discretion of your surgeon and anesthesiologist your surgery may need to be rescheduled.  Do not eat food after midnight the night before surgery.  No gum chewing or hard candies.  You may however, drink Water up to 2 hours before you are scheduled to arrive for your surgery. Do not drink anything within 2 hours of your scheduled arrival time.   In addition, your doctor has ordered for you to drink the provided:  Gatorade G2 Drinking this carbohydrate drink up to two hours before surgery helps to reduce insulin  resistance and improve patient outcomes. Please complete drinking 2 hours before scheduled arrival time.  One week prior to surgery:Stop NOW (06-15-24) Stop Anti-inflammatories (NSAIDS) such as Advil, Aleve, Ibuprofen, Motrin, Naproxen, Naprosyn and Aspirin based products such as Excedrin, Goody's Powder, BC Powder. Stop ANY OVER THE COUNTER supplements until after surgery (Vitamin D3, Vitamin B12)  You may however, continue to take Tylenol  if needed for pain up until the day of surgery.  Stop your Semaglutide (Ozempic) 7 days prior to surgery-Do NOT take again until AFTER surgery  Continue taking all of your other prescription medications up until the day of surgery.  ON THE DAY OF SURGERY ONLY TAKE THESE MEDICATIONS WITH SIPS OF WATER: -amLODipine  (NORVASC )  -levothyroxine  (SYNTHROID )    Bring your Albuterol  Inhaler to the hospital  No Alcohol for 24 hours before or  after surgery.  No Smoking including e-cigarettes for 24 hours before surgery.  No chewable tobacco products for at least 6 hours before surgery.  No nicotine  patches on the day of surgery.  Do not use any recreational drugs for at least a week (preferably 2 weeks) before your surgery.  Please be advised that the combination of cocaine and anesthesia may have negative outcomes, up to and including death. If you test positive for cocaine, your surgery will be cancelled.  On the morning of surgery brush your teeth with toothpaste and water, you may rinse your mouth with mouthwash if you wish. Do not swallow any toothpaste or mouthwash.  Use CHG Soap as directed on instruction sheet.  Do not wear jewelry, make-up, hairpins, clips or nail polish.  For welded (permanent) jewelry: bracelets, anklets, waist bands, etc.  Please have this removed prior to surgery.  If it is not removed, there is a chance that hospital personnel will need to cut it off on the day of surgery.  Do not wear lotions, powders, or perfumes.   Do not shave body hair from the neck down 48 hours before surgery.  Contact lenses, hearing aids and dentures may not be worn into surgery.  Do not bring valuables to the hospital. [facility] is not responsible for any missing/lost belongings or valuables.   Notify your doctor if there is any change in your medical condition (cold, fever, infection).  Wear comfortable clothing (specific to your surgery type) to the hospital.  After surgery, you can help prevent lung complications by doing breathing exercises.  Take deep  breaths and cough every 1-2 hours. Your doctor may order a device called an Incentive Spirometer to help you take deep breaths. When coughing or sneezing, hold a pillow firmly against your incision with both hands. This is called splinting. Doing this helps protect your incision. It also decreases belly discomfort.  If you are being admitted to the  hospital overnight, leave your suitcase in the car. After surgery it may be brought to your room.  In case of increased patient census, it may be necessary for you, the patient, to continue your postoperative care in the Same Day Surgery department.  If you are being discharged the day of surgery, you will not be allowed to drive home. You will need a responsible individual to drive you home and stay with you for 24 hours after surgery.   If you are taking public transportation, you will need to have a responsible individual with you.  Please call the Pre-admissions Testing Dept. at [phone] if you have any questions about these instructions.  Surgery Visitation Policy:  Patients having surgery or a procedure may have two visitors.  Children under the age of 79 must have an adult with them who is not the patient.                                                                                                             Preparing for Surgery with CHLORHEXIDINE  GLUCONATE (CHG) Soap  Chlorhexidine  Gluconate (CHG) Soap  o An antiseptic cleaner that kills germs and bonds with the skin to continue killing germs even after washing  o Used for showering the night before surgery and morning of surgery  Before surgery, you can play an important role by reducing the number of germs on your skin.  CHG (Chlorhexidine  gluconate) soap is an antiseptic cleanser which kills germs and bonds with the skin to continue killing germs even after washing.  Please do not use if you have an allergy to CHG or antibacterial soaps. If your skin becomes reddened/irritated stop using the CHG.  1. Shower the NIGHT BEFORE SURGERY with CHG soap.  2. If you choose to wash your hair, wash your hair first as usual with your normal shampoo.  3. After shampooing, rinse your hair and body thoroughly to remove the shampoo.  4. Use CHG as you would any other liquid soap. You can apply CHG directly to the skin and  wash gently with a clean washcloth.  5. Apply the CHG soap to your body only from the neck down. Do not use on open wounds or open sores. Avoid contact with your eyes, ears, mouth, and genitals (private parts). Wash face and genitals (private parts) with your normal soap.  6. Wash thoroughly, paying special attention to the area where your surgery will be performed.  7. Thoroughly rinse your body with warm water.  8. Do not shower/wash with your normal soap after using and rinsing off the CHG soap.  9. Do not use lotions, oils, etc., after showering with CHG.  10. Bruna  yourself dry with a clean towel.  11. Wear clean pajamas to bed the night before surgery.  12. Place clean sheets on your bed the night of your shower and do not sleep with pets.  13. Do not apply any deodorants/lotions/powders.  14. Please wear clean clothes to the hospital.  15. Remember to brush your teeth with your regular toothpaste.   Merchandiser, Retail to address health-related social needs:  [email or web address]

## 2024-06-19 ENCOUNTER — Encounter: Payer: Self-pay | Admitting: Podiatry

## 2024-06-19 NOTE — Progress Notes (Incomplete)
 Perioperative / Anesthesia Services  Pre-Admission Testing Clinical Review / Pre-Operative Anesthesia Consult  Date: 06/19/2024  PATIENT DEMOGRAPHICS: Name: Lindsey Benjamin DOB: 1966-06-18 MRN:   [id number]  Note: Available PAT nursing documentation and vital signs have been reviewed. Clinical nursing staff has updated patient's PMH/PSHx, current medication list, and drug allergies/intolerances to ensure complete and comprehensive history available to assist care teams in MDM as it pertains to the aforementioned surgical procedure and anticipated anesthetic course. Extensive review of available clinical information personally performed. Nursing documentation reviewed. [place] PMH and PSHx updated with any diagnoses and/or procedures that I have knowledge of that may have been inadvertently omitted during her intake with the pre-admission testing department's nursing staff.  PLANNED SURGICAL PROCEDURE(S):   Case: [id number] Date/Time: 06/22/24 0715   Procedure: AMPUTATION, TOE (Left: Fourth Toe)   Anesthesia type: Choice   Diagnosis:      Toe deformity, left [M20.62]     Pain in toes of both feet [M79.674, M79.675]     Type 2 diabetes mellitus without complication, without long-term current use of insulin  (HCC) [E11.9]   Pre-op diagnosis:      Toe deformity, left M20.62     Pain in toes of both feet M79.674, M79.675     Type 2 diabetes mellitus without complication, without long-term current use of insulin  HCC E11.9   Location: [facility] OR ROOM 01 / [facility]   Surgeons: Ashley Soulier, DPM        CLINICAL DISCUSSION: [name] is a 58 y.o. female who is submitted for pre-surgical anesthesia review and clearance prior to her undergoing the above procedure. Patient is a Current Smoker (10 pack years). Pertinent PMH includes: HCM, NSTEMI, HFpEF, PSVT, cardiomegaly, HTN, HLD, T2DM, hypothyroidism, asthma, chronic hypoxic respiratory failure (on supplemental  oxygen), OSAH (on nocturnal PAP therapy), GERD (on daily PPI), anemia, congenital LEFT toe deformity, anxiety, medication noncompliance.  Patient is followed by cardiology Leobardo, MD). She was last seen in the cardiology clinic on 04/18/2024; notes reviewed. At the time of her clinic visit, patient with chronic dyspnea related to her COPD diagnosis and ongoing smoking history.  Patient experiencing lower extremity edema.  She denied any chest pain, PND, orthopnea, palpitations, weakness, fatigue, vertiginous symptoms, or presyncope/syncope. Patient with a past medical history significant for cardiovascular diagnoses. Documented physical exam was grossly benign, providing no evidence of acute exacerbation and/or decompensation of the patient's known cardiovascular conditions.  Myocardial perfusion imaging study was performed on 03/13/2015 revealing a normal left ventricular systolic function with a EF of 55 to 65%.  During stress, T wave inversions noted in leads I, II, III, aVL, and V4-V6.  Overall, there was no evidence of significant ischemia or arrhythmia.  Study determined to be normal and low risk.  Patient underwent myocardial perfusion imaging study on 11/03/2020 revealing a normal left ventricular systolic function with an EF of 50% at rest.  EF did decrease to 35% with stress.  Perfusion defects noted in the mid inferolateral, basal inferolateral, and apical segments consistent with possible ischemia.  There were no significant coronary artery calcifications noted on the attenuation correction imaging.  There was mild transient LV dilatation noted with stress.  Findings felt to be consistent with balanced ischemia.  Patient underwent diagnostic LEFT heart catheterization on 11/24/2020.  Study revealed normal left ventricular systolic function.  LVEDP elevated at 25 mmHg.  There was no significant coronary artery disease.  Recommendations were for medical management.  TTE performed on  06/13/2023  revealed a normal left ventricular systolic function with an EF of >55%.  There were no significant regional wall motion abnormalities.  Left ventricular diastolic Doppler parameters were normal.  Mild to moderate circumferential pericardial effusion measuring 1.1 cm was noted.  No right ventricular diastolic collapse or exaggerated respiratory variation noted and transvalvular Doppler flow velocities.  No echocardiographic evidence of tamponade physiology.  Patient suffered an NSTEMI on 08/17/2023.  Troponin peaked at 103 ng/L.  Cardiac event occurred in the setting of acute respiratory failure secondary to AECOPD.  Component of demand ischemia was present.  Cardiac MRI was performed on 09/06/2023 revealing severe asymmetric septal hypertrophy with a maximal wall thickness of 2.7 cm.  There was diffuse LGE that was most prominent in the mid anterior, septal, and apical inferior walls.  Greater than 50% of the myocardium was enhanced.  Left ventricular size and function was normal with a hyperdynamic LVEF of 67%.  There was no significant LVOT obstruction.  Findings consistent with hypertrophic cardiomyopathy.  Blood pressure uncontrolled at 173/96 mmHg on currently prescribed CCB (amlodipine ), diuretic (furosemide ), beta-blocker (metoprolol succinate), and MRA (prior lactone) therapies.  Patient is not on any type of lipid-lowering therapies for her HLD diagnosis and further ASCVD prevention.  T2DM well-controlled on currently prescribed regimen; last HgbA1c was 6.1% when checked on 05/03/2024.  Patient does have an OSAH diagnosis and compliant with prescribed nocturnal BiPAP therapy.  Patient has a history of chronic hypoxemic respiratory failure requiring the use of supplemental oxygen ATC (5-6 L/[place]).  Functional capacity limited by patient's age, weight, multiple medical comorbidities, and overall respiratory status.  She is able to complete her ADLs/IADLs with some degree of cardiovascular limitation.   Per the [facility], patient questionably able to achieve 4 METS of physical activity without experiencing, at least to some degree, significant angina/anginal equivalent symptoms. No changes were made to her medication regimen during her visit with cardiology.  Patient scheduled to follow-up with outpatient cardiology in 6 months or sooner if needed.  MALLORIE NORROD is scheduled for an elective AMPUTATION, TOE (Left: Fourth Toe) on 06/22/2024 with Dr. Eva Gay, DPM. Given patient's past medical history significant for cardiovascular diagnoses, presurgical cardiac clearance was sought by the PAT team. Per cardiology, patient has slightly elevated risk due to her underlying HCM, but no further testing is indicated prior to proceeding.  In review of her medication reconciliation, the patient is not noted to be taking any type of anticoagulation or antiplatelet therapies that would need to be held during her perioperative course.  Patient denies previous perioperative complications with anesthesia in the past. In review her EMR, it is noted that patient underwent a general anesthetic course at [facility] ([facility] in 05/2020 without documented complications.   MOST RECENT VITAL SIGNS:    06/15/2024    2:00 PM 08/18/2023    7:27 AM 08/18/2023    4:49 AM  Vitals with BMI  Height 4' 10    Weight 181 lbs  204 lbs 13 oz  BMI 37.84    Systolic  165   Diastolic  74   Pulse  66    PROVIDERS/SPECIALISTS: NOTE: Primary physician provider listed below. Patient may have been seen by APP or partner within same practice.   PROVIDER ROLE / SPECIALTY LAST SHERLEAN Gay Eva, DPM Podiatry (Surgeon) 06/05/2024  Sherial Bail, MD Primary Care Provider 05/10/2024  Jama Old, MD Cardiology 04/18/2024   ALLERGIES: Allergies[1]  CURRENT HOME  MEDICATIONS:  acetaminophen  (TYLENOL ) 650 MG CR tablet   albuterol  (VENTOLIN  HFA) 108 (90 Base) MCG/ACT inhaler    amLODipine  (NORVASC ) 10 MG tablet   Cholecalciferol (D3 VITAMIN PO)   diphenhydrAMINE-PSE-APAP (EQL ALLERGY/SINUS HEADACHE PO)   fluticasone  (FLONASE) 50 MCG/ACT nasal spray   furosemide  (LASIX ) 40 MG tablet   gabapentin  (NEURONTIN ) 600 MG tablet   levothyroxine  (SYNTHROID ) 200 MCG tablet   levothyroxine  (SYNTHROID ) 50 MCG tablet   loperamide (IMODIUM) 2 MG capsule   losartan  (COZAAR ) 100 MG tablet   metoprolol succinate (TOPROL-XL) 50 MG 24 hr tablet   pantoprazole  (PROTONIX ) 40 MG tablet   pravastatin  (PRAVACHOL ) 40 MG tablet   spironolactone  (ALDACTONE ) 25 MG tablet   blood glucose meter kit and supplies KIT   Blood Glucose Monitoring Suppl (TRUE METRIX METER) w/Device KIT   glucose blood (RIGHTEST GS550 BLOOD GLUCOSE) test strip   OXYGEN   Rightest GL300 Lancets MISC   Semaglutide, 1 MG/DOSE, 4 MG/3ML SOPN   HISTORY: Past Medical History:  Diagnosis Date   (HFpEF) heart failure with preserved ejection fraction (HCC)    Anxiety    Asthma    Chronic hypoxic respiratory failure, on home oxygen therapy (HCC)    6L [place]   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 demand ischemia component   OSA treated with BiPAP    unable to use Bi-Pap currently due to cracked humidifier-Using Oxygen 5 L at bedtime (06-15-24)   PSVT (paroxysmal supraventricular tachycardia)    RLS (restless legs syndrome)    SIRS  (systemic inflammatory response syndrome) (HCC) 08/17/2023   Smoker    Past Surgical History:  Procedure Laterality Date   CARDIAC CATHETERIZATION  11/2020   CESAREAN SECTION     x2   CHOLECYSTECTOMY     FOOT SURGERY Left    INNER EAR SURGERY     tubes in ears and repair a hole in the eardrum   Family History  Problem Relation Age of Onset   Cushing syndrome Mother    Thyroid  disease Mother    Heart attack Father 36   Hyperlipidemia Brother    Thyroid  disease Brother    Hypertension Brother    Breast cancer Neg Hx    Social History   Tobacco Use   Smoking status: Every Day    Current packs/day: 0.50    Average packs/day: 0.5 packs/day for 20.0 years (10.0 ttl pk-yrs)    Types: Cigarettes   Smokeless tobacco: Never  Substance Use Topics   Alcohol use: Not Currently    Alcohol/week: 0.0 - 1.0 standard drinks of alcohol    Comment: occasionally   LABS:  No visits with results within 30 Day(s) from this visit.  Latest known visit with results is:  Admission on 08/17/2023, Discharged on 08/18/2023  Component Date Value Ref Range Status   Specimen Description 08/17/2023  BLOOD RIGHT HAND   Final   Special Requests 08/17/2023 BOTTLES DRAWN AEROBIC AND ANAEROBIC Blood Culture results may not be optimal due to an inadequate volume of blood received in culture bottles   Final   Culture 08/17/2023    Final                   Value:NO GROWTH 5 DAYS Performed at [facility], [address] [place], [place] [address]    Report Status 08/17/2023 08/22/2023 FINAL   Final   Specimen Description 08/17/2023 BLOOD RIGHT ANTECUBITAL   Final   Special Requests 08/17/2023 BOTTLES DRAWN AEROBIC AND ANAEROBIC Blood Culture adequate volume   Final   Culture 08/17/2023    Final                   Value:NO GROWTH 5 DAYS Performed at [facility], [address] [place], [place] [address]    Report Status 08/17/2023 08/22/2023 FINAL   Final   WBC 08/17/2023 7.2  4.0 -  10.5 K/uL Final   RBC 08/17/2023 5.78 (H)  3.87 - 5.11 MIL/uL Final   Hemoglobin 08/17/2023 16.8 (H)  12.0 - 15.0 g/dL Final   HCT 97/87/7974 51.1 (H)  36.0 - 46.0 % Final   MCV 08/17/2023 88.4  80.0 - 100.0 fL Final   MCH 08/17/2023 29.1  26.0 - 34.0 pg Final   MCHC 08/17/2023 32.9  30.0 - 36.0 g/dL Final   RDW 97/87/7974 12.4  11.5 - 15.5 % Final   Platelets 08/17/2023 179  150 - 400 K/uL Final   nRBC 08/17/2023 0.0  0.0 - 0.2 % Final   Neutrophils Relative % 08/17/2023 81  % Final   Neutro Abs 08/17/2023 5.9  1.7 - 7.7 K/uL Final   Lymphocytes Relative 08/17/2023 12  % Final   Lymphs Abs 08/17/2023 0.9  0.7 - 4.0 K/uL Final   Monocytes Relative 08/17/2023 5  % Final   Monocytes Absolute 08/17/2023 0.3  0.1 - 1.0 K/uL Final   Eosinophils Relative 08/17/2023 1  % Final   Eosinophils Absolute 08/17/2023 0.1  0.0 - 0.5 K/uL Final   Basophils Relative 08/17/2023 1  % Final   Basophils Absolute 08/17/2023 0.0  0.0 - 0.1 K/uL Final   Immature Granulocytes 08/17/2023 0  % Final   Abs Immature Granulocytes 08/17/2023 0.02  0.00 - 0.07 K/uL Final   Performed at [facility], [address] [place], [place] [address]   B Natriuretic Peptide 08/17/2023 192.1 (H)  0.0 - 100.0 pg/mL Final   Performed at [facility], [address] [place], [place] [address]   Troponin I (High Sensitivity) 08/17/2023 78 (H)  <18 ng/L Final   Comment: (NOTE) Elevated high sensitivity troponin I (hsTnI) values and significant  changes across serial measurements may suggest ACS but many other  chronic and acute conditions are known to elevate hsTnI results.  Refer to the Links section for chest pain algorithms and additional  guidance. Performed at [facility], [address] [place], [place] [address]    Specimen Source 08/17/2023 URINE, CLEAN CATCH   Final   Color, Urine 08/17/2023 YELLOW (A)  YELLOW Final   APPearance 08/17/2023 CLEAR (A)  CLEAR Final   Specific Gravity,  Urine 08/17/2023 1.011  1.005 - 1.030 Final   pH 08/17/2023 8.0  5.0 - 8.0 Final   Glucose, UA 08/17/2023 NEGATIVE  NEGATIVE mg/dL Final   Hgb urine dipstick 08/17/2023 SMALL (  A)  NEGATIVE Final   Bilirubin Urine 08/17/2023 NEGATIVE  NEGATIVE Final   Ketones, ur 08/17/2023 NEGATIVE  NEGATIVE mg/dL Final   Protein, ur 97/87/7974 30 (A)  NEGATIVE mg/dL Final   Nitrite 97/87/7974 NEGATIVE  NEGATIVE Final   Leukocytes,Ua 08/17/2023 NEGATIVE  NEGATIVE Final   RBC / HPF 08/17/2023 0-5  0 - 5 RBC/hpf Final   WBC, UA 08/17/2023 0-5  0 - 5 WBC/hpf Final   Comment:        Reflex urine culture not performed if WBC <=10, OR if Squamous epithelial cells >5. If Squamous epithelial cells >5 suggest recollection.    Bacteria, UA 08/17/2023 RARE (A)  NONE SEEN Final   Squamous Epithelial / HPF 08/17/2023 0-5  0 - 5 /HPF Final   Performed at [facility], [address] [place], [place] [address]   SARS Coronavirus 2 by RT PCR 08/17/2023 NEGATIVE  NEGATIVE Final   Comment: (NOTE) SARS-CoV-2 target nucleic acids are NOT DETECTED.  The SARS-CoV-2 RNA is generally detectable in upper respiratory specimens during the acute phase of infection. The lowest concentration of SARS-CoV-2 viral copies this assay can detect is 138 copies/mL. A negative result does not preclude SARS-Cov-2 infection and should not be used as the sole basis for treatment or other patient management decisions. A negative result may occur with  improper specimen collection/handling, submission of specimen other than nasopharyngeal swab, presence of viral mutation(s) within the areas targeted by this assay, and inadequate number of viral copies(<138 copies/mL). A negative result must be combined with clinical observations, patient history, and epidemiological information. The expected result is Negative.  Fact Sheet for Patients:  bloggercourse.com  Fact Sheet for Healthcare Providers:   seriousbroker.it  This test is no                          t yet approved or cleared by the [place]  FDA and  has been authorized for detection and/or diagnosis of SARS-CoV-2 by FDA under an Emergency Use Authorization (EUA). This EUA will remain  in effect (meaning this test can be used) for the duration of the COVID-19 declaration under Section 564(b)(1) of the Act, 21 U.S.C.section 360bbb-3(b)(1), unless the authorization is terminated  or revoked sooner.       Influenza A by PCR 08/17/2023 POSITIVE (A)  NEGATIVE Final   Influenza B by PCR 08/17/2023 NEGATIVE  NEGATIVE Final   Comment: (NOTE) The Xpert Xpress SARS-CoV-2/FLU/RSV plus assay is intended as an aid in the diagnosis of influenza from Nasopharyngeal swab specimens and should not be used as a sole basis for treatment. Nasal washings and aspirates are unacceptable for Xpert Xpress SARS-CoV-2/FLU/RSV testing.  Fact Sheet for Patients: bloggercourse.com  Fact Sheet for Healthcare Providers: seriousbroker.it  This test is not yet approved or cleared by the [place]  FDA and has been authorized for detection and/or diagnosis of SARS-CoV-2 by FDA under an Emergency Use Authorization (EUA). This EUA will remain in effect (meaning this test can be used) for the duration of the COVID-19 declaration under Section 564(b)(1) of the Act, 21 U.S.C. section 360bbb-3(b)(1), unless the authorization is terminated or revoked.     Resp Syncytial Virus by PCR 08/17/2023 NEGATIVE  NEGATIVE Final   Comment: (NOTE) Fact Sheet for Patients: bloggercourse.com  Fact Sheet for Healthcare Providers: seriousbroker.it  This test is not yet approved or cleared by the [place]  FDA and has been authorized for detection and/or diagnosis of SARS-CoV-2  by FDA under an Emergency Use Authorization (EUA). This  EUA will remain in effect (meaning this test can be used) for the duration of the COVID-19 declaration under Section 564(b)(1) of the Act, 21 U.S.C. section 360bbb-3(b)(1), unless the authorization is terminated or revoked.  Performed at [facility], [address] [place], [place] [address]    Troponin I (High Sensitivity) 08/17/2023 105 (HH)  <18 ng/L Final   Comment: CRITICAL RESULT CALLED TO, READ BACK BY AND VERIFIED WITH BRANDY LIGHT 08/17/23 0926 MW (NOTE) Elevated high sensitivity troponin I (hsTnI) values and significant  changes across serial measurements may suggest ACS but many other  chronic and acute conditions are known to elevate hsTnI results.  Refer to the Links section for chest pain algorithms and additional  guidance. Performed at [facility], [address] [place], [place] [address]    Sodium 08/17/2023 134 (L)  135 - 145 mmol/L Final   Potassium 08/17/2023 4.1  3.5 - 5.1 mmol/L Final   Chloride 08/17/2023 97 (L)  98 - 111 mmol/L Final   CO2 08/17/2023 24  22 - 32 mmol/L Final   Glucose, Bld 08/17/2023 140 (H)  70 - 99 mg/dL Final   Glucose reference range applies only to samples taken after fasting for at least 8 hours.   BUN 08/17/2023 13  6 - 20 mg/dL Final   Creatinine, Ser 08/17/2023 0.83  0.44 - 1.00 mg/dL Final   Calcium 97/87/7974 8.5 (L)  8.9 - 10.3 mg/dL Final   Total Protein 97/87/7974 7.1  6.5 - 8.1 g/dL Final   Albumin 97/87/7974 3.7  3.5 - 5.0 g/dL Final   AST 97/87/7974 48 (H)  15 - 41 U/L Final   ALT 08/17/2023 43  0 - 44 U/L Final   Alkaline Phosphatase 08/17/2023 50  38 - 126 U/L Final   Total Bilirubin 08/17/2023 1.1  0.0 - 1.2 mg/dL Final   GFR, Estimated 08/17/2023 >60  >60 mL/min Final   Comment: (NOTE) Calculated using the CKD-EPI Creatinine Equation (2021)    Anion gap 08/17/2023 13  5 - 15 Final   Performed at [facility], [address] [place], [place] [address]   Troponin I (High  Sensitivity) 08/17/2023 61 (H)  <18 ng/L Final   Comment: (NOTE) Elevated high sensitivity troponin I (hsTnI) values and significant  changes across serial measurements may suggest ACS but many other  chronic and acute conditions are known to elevate hsTnI results.  Refer to the Links section for chest pain algorithms and additional  guidance. Performed at [facility], [address] [place], [place] [address]    Glucose-Capillary 08/17/2023 195 (H)  70 - 99 mg/dL Final   Glucose reference range applies only to samples taken after fasting for at least 8 hours.   Troponin I (High Sensitivity) 08/17/2023 50 (H)  <18 ng/L Final   Comment: (NOTE) Elevated high sensitivity troponin I (hsTnI) values and significant  changes across serial measurements may suggest ACS but many other  chronic and acute conditions are known to elevate hsTnI results.  Refer to the Links section for chest pain algorithms and additional  guidance. Performed at [facility], [address] [place], [place] [address]    WBC 08/18/2023 7.3  4.0 - 10.5 K/uL Final   RBC 08/18/2023 5.06  3.87 - 5.11 MIL/uL Final   Hemoglobin 08/18/2023 14.6  12.0 - 15.0 g/dL Final   HCT 97/86/7974 44.4  36.0 - 46.0 %  Final   MCV 08/18/2023 87.7  80.0 - 100.0 fL Final   MCH 08/18/2023 28.9  26.0 - 34.0 pg Final   MCHC 08/18/2023 32.9  30.0 - 36.0 g/dL Final   RDW 97/86/7974 12.7  11.5 - 15.5 % Final   Platelets 08/18/2023 153  150 - 400 K/uL Final   nRBC 08/18/2023 0.0  0.0 - 0.2 % Final   Performed at [facility], [address] [place], [place] [address]   Sodium 08/18/2023 136  135 - 145 mmol/L Final   Potassium 08/18/2023 4.0  3.5 - 5.1 mmol/L Final   Chloride 08/18/2023 102  98 - 111 mmol/L Final   CO2 08/18/2023 26  22 - 32 mmol/L Final   Glucose, Bld 08/18/2023 148 (H)  70 - 99 mg/dL Final   Glucose reference range applies only to samples taken after fasting for at least 8 hours.   BUN  08/18/2023 14  6 - 20 mg/dL Final   Creatinine, Ser 08/18/2023 0.74  0.44 - 1.00 mg/dL Final   Calcium 97/86/7974 8.7 (L)  8.9 - 10.3 mg/dL Final   Total Protein 97/86/7974 7.2  6.5 - 8.1 g/dL Final   Albumin 97/86/7974 3.5  3.5 - 5.0 g/dL Final   AST 97/86/7974 39  15 - 41 U/L Final   ALT 08/18/2023 44  0 - 44 U/L Final   Alkaline Phosphatase 08/18/2023 47  38 - 126 U/L Final   Total Bilirubin 08/18/2023 0.6  0.0 - 1.2 mg/dL Final   GFR, Estimated 08/18/2023 >60  >60 mL/min Final   Comment: (NOTE) Calculated using the CKD-EPI Creatinine Equation (2021)    Anion gap 08/18/2023 8  5 - 15 Final   Performed at [facility], [address] [place], [place] [address]   Glucose-Capillary 08/17/2023 188 (H)  70 - 99 mg/dL Final   Glucose reference range applies only to samples taken after fasting for at least 8 hours.   Lactic Acid, Venous 08/17/2023 1.6  0.5 - 1.9 mmol/L Final   Performed at [facility], [address] [place], [place] [address]   Glucose-Capillary 08/17/2023 173 (H)  70 - 99 mg/dL Final   Glucose reference range applies only to samples taken after fasting for at least 8 hours.   Glucose-Capillary 08/18/2023 139 (H)  70 - 99 mg/dL Final   Glucose reference range applies only to samples taken after fasting for at least 8 hours.   Glucose-Capillary 08/18/2023 163 (H)  70 - 99 mg/dL Final   Glucose reference range applies only to samples taken after fasting for at least 8 hours.    ECG: Date: 06/20/2024  Time ECG obtained: 0926 AM Rate: 80 bpm Rhythm: normal sinus Axis (leads I and aVF): normal Intervals: PR 194 ms. QRS 96 ms. QTc 470 ms. ST segment and T wave changes: No evidence of acute T wave abnormalities or significant ST segment elevation or depression.  Evidence of a possible, age undetermined, prior infarct:  No Comparison: Similar to previous tracing obtained on 08/17/2023   IMAGING / PROCEDURES: X-RAY FOOT LEFT 3 PLUS VIEWS performed  on 03/08/2024 Shortened fourth metatarsal with hammertoe contracture consistent with breaking metatarsal.   No acute fracture throughout.   Mild osteoarthritic changes in the midtarsal region most noted at the talonavicular joint.   Posterior Achilles insertional calcaneal exostosis demonstrated.   MRI CARDIAC [facility] W WO CONTRAST performed on 09/06/2023 Severe asymmetric septal hypertrophy, maximal wall thickness = 2.7cm Diffuse heterogenous enhancement  on late gadolinium imaging, most prominent in the mid anterior and septal walls, and apical inferior wall. Greater than 15% of the myocardium is enhanced.  Normal left ventricle size,  and systolic function; LVEF = 67%  No significant LVOT obstruction  Normal right ventricular systolic function  Findings are consistent with hypertrophic cardiomyopathy   TRANSTHORACIC ECHOCARDIOGRAM performed on 06/13/2023 Limited study to assess pericardial effusion.  There is a small to moderate, circumferential pericardial effusion measuring 1.1cm that appears grossly unchanged from the prior study (04/12/2023).  There is no echocardiographic evidence of tamponade physiology.   LEFT HEART CATHETERIZATION AND CORONARY ANGIOGRAPHY performed on 11/24/2020 Normal left ventricular systolic function and EF. 10-20% stenosis mid LAD 10% stenosis ostial LCx 20% mid RCA Recommendations: medical management  PET CT MYOCARDIAL PERFUSION MULTIPLE performed on 11/03/2020 There is a small, mildly severe, nearly completely reversible perfusion defect involving the mid inferolateral and basal inferolateral segments.  This is consistent with possible ischemia.  There is a very small, mildly severe, completely reversible perfusion defect involving the apical segment. This is consistent with possible artifact (motion and misregistration of attenuation CT) but, cannot rule out subtle ischemia.  No significant coronary calcifications were noted on the attenuation CT  At rest,  left ventricular systolic function is moderately reduced. Post stress the ejection fraction is calculated at 35%. The left ventricle is normal in size. EF at rest 50%  Mild transient LV dilation was noted with stress, while this finding is non-specific, it can be associated with so called diffuse balanced ischemia  There is a drop in ejection fraction during stress as compared to rest which is non-specific but can be seen in balanced ischemia   IMPRESSION AND PLAN: SHALIMAR MCCLAIN has been referred for pre-anesthesia review and clearance prior to her undergoing the planned anesthetic and procedural courses. Available labs, pertinent testing, and imaging results were personally reviewed by me in preparation for upcoming operative/procedural course. [facility] medical record has been updated following extensive record review and patient interview with PAT staff.   This patient has been appropriately cleared by cardiology with an overall MODERATE risk of patient experiencing significant perioperative cardiovascular complications. here at [facility]. Based on clinical review performed today (06/19/2024), barring any significant acute changes in the patient's overall condition, it is anticipated that she will be able to proceed with the planned surgical intervention. Any acute changes in clinical condition may necessitate her procedure being postponed and/or cancelled. Patient will meet with anesthesia team (MD and/or CRNA) on the day of her procedure for preoperative evaluation/assessment. Questions regarding anesthetic course will be fielded at that time.   Pre-surgical instructions were reviewed with the patient during his PAT appointment, and questions were fielded to satisfaction by PAT clinical staff. She has been instructed on which medications that she will need to hold prior to surgery, as well as the ones that have been deemed safe/appropriate to take on the day  of her procedure. As part of the general education provided by PAT, patient made aware both verbally and in writing, that she would need to abstain from the use of any illegal substances during her perioperative course. She was advised that failure to follow the provided instructions could necessitate case cancellation or result in serious perioperative complications up to and including death. Patient encouraged to contact PAT and/or her surgeon's office to discuss any questions or concerns that may arise prior to surgery; verbalized understanding.   Dorise Pereyra, MSN, APRN,  FNP-C, [facility]  Perioperative Services Nurse Practitioner Phone: [phone] Fax: [phone] 06/19/2024 9:55 AM  NOTE: This note has been prepared using Dragon dictation software. Despite my best ability to proofread, there is always the potential that unintentional transcriptional errors may still occur from this process.     [1]  Allergies Allergen Reactions   Ceclor [Cefaclor] Hives   Levofloxacin Rash   Lisinopril Cough   Ofloxacin Swelling   Penicillins Anaphylaxis   Ranitidine Hives   Tomato Rash   Vibramycin [Doxycycline] Nausea And Vomiting

## 2024-06-20 ENCOUNTER — Inpatient Hospital Stay: Admission: RE | Admit: 2024-06-20 | Discharge: 2024-06-20 | Attending: Podiatry | Admitting: Podiatry

## 2024-06-20 DIAGNOSIS — I5032 Chronic diastolic (congestive) heart failure: Secondary | ICD-10-CM | POA: Diagnosis not present

## 2024-06-20 DIAGNOSIS — J449 Chronic obstructive pulmonary disease, unspecified: Secondary | ICD-10-CM | POA: Diagnosis not present

## 2024-06-20 DIAGNOSIS — I421 Obstructive hypertrophic cardiomyopathy: Secondary | ICD-10-CM | POA: Insufficient documentation

## 2024-06-20 DIAGNOSIS — Z01812 Encounter for preprocedural laboratory examination: Secondary | ICD-10-CM

## 2024-06-20 DIAGNOSIS — I214 Non-ST elevation (NSTEMI) myocardial infarction: Secondary | ICD-10-CM | POA: Insufficient documentation

## 2024-06-20 DIAGNOSIS — R9431 Abnormal electrocardiogram [ECG] [EKG]: Secondary | ICD-10-CM | POA: Diagnosis not present

## 2024-06-20 DIAGNOSIS — E119 Type 2 diabetes mellitus without complications: Secondary | ICD-10-CM | POA: Insufficient documentation

## 2024-06-20 DIAGNOSIS — Z01818 Encounter for other preprocedural examination: Secondary | ICD-10-CM | POA: Diagnosis present

## 2024-06-20 DIAGNOSIS — Z01810 Encounter for preprocedural cardiovascular examination: Secondary | ICD-10-CM

## 2024-06-20 LAB — CBC
HCT: 48.1 % — ABNORMAL HIGH (ref 36.0–46.0)
Hemoglobin: 16.2 g/dL — ABNORMAL HIGH (ref 12.0–15.0)
MCH: 29.1 pg (ref 26.0–34.0)
MCHC: 33.7 g/dL (ref 30.0–36.0)
MCV: 86.4 fL (ref 80.0–100.0)
Platelets: 245 K/uL (ref 150–400)
RBC: 5.57 MIL/uL — ABNORMAL HIGH (ref 3.87–5.11)
RDW: 13.1 % (ref 11.5–15.5)
WBC: 7.1 K/uL (ref 4.0–10.5)
nRBC: 0 % (ref 0.0–0.2)

## 2024-06-20 LAB — BASIC METABOLIC PANEL WITH GFR
Anion gap: 12 (ref 5–15)
BUN: 13 mg/dL (ref 6–20)
CO2: 27 mmol/L (ref 22–32)
Calcium: 9.5 mg/dL (ref 8.9–10.3)
Chloride: 100 mmol/L (ref 98–111)
Creatinine, Ser: 0.9 mg/dL (ref 0.44–1.00)
GFR, Estimated: >60 mL/min (ref >60)
Glucose, Bld: 90 mg/dL (ref 70–99)
Potassium: 3.7 mmol/L (ref 3.5–5.1)
Sodium: 139 mmol/L (ref 135–145)

## 2024-06-22 ENCOUNTER — Ambulatory Visit: Payer: Self-pay | Admitting: Urgent Care

## 2024-06-22 ENCOUNTER — Encounter: Payer: Self-pay | Admitting: Urgent Care

## 2024-06-22 ENCOUNTER — Encounter: Payer: Self-pay | Admitting: Podiatry

## 2024-06-22 ENCOUNTER — Encounter: Admission: RE | Payer: Self-pay

## 2024-06-22 ENCOUNTER — Ambulatory Visit
Admission: RE | Admit: 2024-06-22 | Discharge: 2024-06-22 | Disposition: A | Discharge status: Home / Self Care | Attending: Podiatry | Admitting: Podiatry

## 2024-06-22 ENCOUNTER — Ambulatory Visit

## 2024-06-22 ENCOUNTER — Other Ambulatory Visit: Payer: Self-pay

## 2024-06-22 DIAGNOSIS — J449 Chronic obstructive pulmonary disease, unspecified: Secondary | ICD-10-CM | POA: Diagnosis not present

## 2024-06-22 DIAGNOSIS — I5030 Unspecified diastolic (congestive) heart failure: Secondary | ICD-10-CM | POA: Insufficient documentation

## 2024-06-22 DIAGNOSIS — K219 Gastro-esophageal reflux disease without esophagitis: Secondary | ICD-10-CM | POA: Insufficient documentation

## 2024-06-22 DIAGNOSIS — I471 Supraventricular tachycardia, unspecified: Secondary | ICD-10-CM | POA: Insufficient documentation

## 2024-06-22 DIAGNOSIS — E1165 Type 2 diabetes mellitus with hyperglycemia: Secondary | ICD-10-CM | POA: Diagnosis not present

## 2024-06-22 DIAGNOSIS — J9611 Chronic respiratory failure with hypoxia: Secondary | ICD-10-CM | POA: Diagnosis not present

## 2024-06-22 DIAGNOSIS — D649 Anemia, unspecified: Secondary | ICD-10-CM | POA: Diagnosis not present

## 2024-06-22 DIAGNOSIS — S93105A Unspecified dislocation of left toe(s), initial encounter: Secondary | ICD-10-CM | POA: Diagnosis present

## 2024-06-22 DIAGNOSIS — I422 Other hypertrophic cardiomyopathy: Secondary | ICD-10-CM | POA: Insufficient documentation

## 2024-06-22 DIAGNOSIS — M79674 Pain in right toe(s): Secondary | ICD-10-CM | POA: Diagnosis not present

## 2024-06-22 DIAGNOSIS — X58XXXA Exposure to other specified factors, initial encounter: Secondary | ICD-10-CM | POA: Insufficient documentation

## 2024-06-22 DIAGNOSIS — Z9981 Dependence on supplemental oxygen: Secondary | ICD-10-CM | POA: Diagnosis not present

## 2024-06-22 DIAGNOSIS — Z7985 Long-term (current) use of injectable non-insulin antidiabetic drugs: Secondary | ICD-10-CM | POA: Diagnosis not present

## 2024-06-22 DIAGNOSIS — G4733 Obstructive sleep apnea (adult) (pediatric): Secondary | ICD-10-CM | POA: Diagnosis not present

## 2024-06-22 DIAGNOSIS — Q6689 Other  specified congenital deformities of feet: Secondary | ICD-10-CM | POA: Diagnosis not present

## 2024-06-22 DIAGNOSIS — E039 Hypothyroidism, unspecified: Secondary | ICD-10-CM | POA: Diagnosis not present

## 2024-06-22 DIAGNOSIS — Z7951 Long term (current) use of inhaled steroids: Secondary | ICD-10-CM | POA: Diagnosis not present

## 2024-06-22 DIAGNOSIS — I252 Old myocardial infarction: Secondary | ICD-10-CM | POA: Diagnosis not present

## 2024-06-22 DIAGNOSIS — M79675 Pain in left toe(s): Secondary | ICD-10-CM | POA: Diagnosis present

## 2024-06-22 DIAGNOSIS — Z01818 Encounter for other preprocedural examination: Secondary | ICD-10-CM

## 2024-06-22 DIAGNOSIS — Z79899 Other long term (current) drug therapy: Secondary | ICD-10-CM | POA: Diagnosis not present

## 2024-06-22 DIAGNOSIS — I110 Hypertensive heart disease with heart failure: Secondary | ICD-10-CM | POA: Insufficient documentation

## 2024-06-22 DIAGNOSIS — F17210 Nicotine dependence, cigarettes, uncomplicated: Secondary | ICD-10-CM | POA: Diagnosis not present

## 2024-06-22 DIAGNOSIS — E785 Hyperlipidemia, unspecified: Secondary | ICD-10-CM | POA: Insufficient documentation

## 2024-06-22 DIAGNOSIS — Z8249 Family history of ischemic heart disease and other diseases of the circulatory system: Secondary | ICD-10-CM | POA: Diagnosis not present

## 2024-06-22 HISTORY — PX: AMPUTATION TOE: SHX6595

## 2024-06-22 LAB — GLUCOSE, CAPILLARY
Glucose-Capillary: 111 mg/dL — ABNORMAL HIGH (ref 70–99)
Glucose-Capillary: 175 mg/dL — ABNORMAL HIGH (ref 70–99)

## 2024-06-22 SURGERY — AMPUTATION, TOE
Anesthesia: General | Site: Fourth Toe | Laterality: Left

## 2024-06-22 MED ORDER — FENTANYL CITRATE (PF) 100 MCG/2ML IJ SOLN: 25.0000 ug | INTRAMUSCULAR | Status: DC | PRN

## 2024-06-22 MED ORDER — CHLORHEXIDINE GLUCONATE 0.12 % MT SOLN
15.0000 mL | Freq: Once | OROMUCOSAL | Status: AC
  Administered 2024-06-22: 15 mL via OROMUCOSAL

## 2024-06-22 MED ORDER — PROPOFOL 1000 MG/100ML IV EMUL
INTRAVENOUS | Status: AC
  Filled 2024-06-22: qty 100

## 2024-06-22 MED ORDER — BUPIVACAINE HCL 0.5 % IJ SOLN
INTRAMUSCULAR | Status: DC | PRN
  Administered 2024-06-22: 5 mL

## 2024-06-22 MED ORDER — FENTANYL CITRATE (PF) 100 MCG/2ML IJ SOLN
INTRAMUSCULAR | Status: AC
  Filled 2024-06-22: qty 2

## 2024-06-22 MED ORDER — SODIUM CHLORIDE 0.9 % IV SOLN: INTRAVENOUS | Status: DC

## 2024-06-22 MED ORDER — MIDAZOLAM HCL 2 MG/2ML IJ SOLN
INTRAMUSCULAR | Status: AC
  Filled 2024-06-22: qty 2

## 2024-06-22 MED ORDER — PROPOFOL 500 MG/50ML IV EMUL
INTRAVENOUS | Status: DC | PRN
  Administered 2024-06-22: 10 mg via INTRAVENOUS
  Administered 2024-06-22: 100 ug/kg/min via INTRAVENOUS

## 2024-06-22 MED ORDER — BUPIVACAINE HCL (PF) 0.5 % IJ SOLN
INTRAMUSCULAR | Status: AC
  Filled 2024-06-22: qty 30

## 2024-06-22 MED ORDER — VANCOMYCIN HCL IN DEXTROSE 1-5 GM/200ML-% IV SOLN
1000.0000 mg | INTRAVENOUS | Status: AC
  Administered 2024-06-22: 1000 mg via INTRAVENOUS

## 2024-06-22 MED ORDER — LIDOCAINE HCL (PF) 1 % IJ SOLN
INTRAMUSCULAR | Status: DC | PRN
  Administered 2024-06-22: 5 mL

## 2024-06-22 MED ORDER — OXYCODONE HCL 5 MG PO TABS
ORAL_TABLET | ORAL | Status: AC
  Filled 2024-06-22: qty 1

## 2024-06-22 MED ORDER — OXYCODONE HCL 5 MG PO TABS
5.0000 mg | ORAL_TABLET | Freq: Once | ORAL | Status: AC | PRN
  Administered 2024-06-22: 5 mg via ORAL

## 2024-06-22 MED ORDER — PHENYLEPHRINE 80 MCG/ML (10ML) SYRINGE FOR IV PUSH (FOR BLOOD PRESSURE SUPPORT)
PREFILLED_SYRINGE | INTRAVENOUS | Status: AC
  Filled 2024-06-22: qty 10

## 2024-06-22 MED ORDER — FENTANYL CITRATE (PF) 100 MCG/2ML IJ SOLN
INTRAMUSCULAR | Status: DC | PRN
  Administered 2024-06-22: 25 ug via INTRAVENOUS

## 2024-06-22 MED ORDER — MIDAZOLAM HCL (PF) 2 MG/2ML IJ SOLN
INTRAMUSCULAR | Status: DC | PRN
  Administered 2024-06-22: 2 mg via INTRAVENOUS

## 2024-06-22 MED ORDER — METOCLOPRAMIDE HCL 5 MG/ML IJ SOLN: 5.0000 mg | Freq: Three times a day (TID) | INTRAMUSCULAR | Status: DC | PRN

## 2024-06-22 MED ORDER — LIDOCAINE HCL (PF) 1 % IJ SOLN
INTRAMUSCULAR | Status: AC
  Filled 2024-06-22: qty 30

## 2024-06-22 MED ORDER — KETAMINE HCL 50 MG/5ML IJ SOSY
PREFILLED_SYRINGE | INTRAMUSCULAR | Status: DC | PRN
  Administered 2024-06-22: 10 mg via INTRAVENOUS

## 2024-06-22 MED ORDER — ONDANSETRON HCL 4 MG PO TABS: 4.0000 mg | ORAL_TABLET | Freq: Four times a day (QID) | ORAL | Status: DC | PRN

## 2024-06-22 MED ORDER — 0.9 % SODIUM CHLORIDE (POUR BTL) OPTIME
TOPICAL | Status: DC | PRN
  Administered 2024-06-22: 500 mL

## 2024-06-22 MED ORDER — LIDOCAINE HCL (PF) 2 % IJ SOLN
INTRAMUSCULAR | Status: AC
  Filled 2024-06-22: qty 5

## 2024-06-22 MED ORDER — VANCOMYCIN HCL IN DEXTROSE 1-5 GM/200ML-% IV SOLN
INTRAVENOUS | Status: AC
  Filled 2024-06-22: qty 200

## 2024-06-22 MED ORDER — ORAL CARE MOUTH RINSE: 15.0000 mL | Freq: Once | OROMUCOSAL | Status: AC

## 2024-06-22 MED ORDER — KETAMINE HCL 50 MG/5ML IJ SOSY
PREFILLED_SYRINGE | INTRAMUSCULAR | Status: AC
  Filled 2024-06-22: qty 5

## 2024-06-22 MED ORDER — PHENYLEPHRINE 80 MCG/ML (10ML) SYRINGE FOR IV PUSH (FOR BLOOD PRESSURE SUPPORT)
PREFILLED_SYRINGE | INTRAVENOUS | Status: DC | PRN
  Administered 2024-06-22: 80 ug via INTRAVENOUS

## 2024-06-22 MED ORDER — GENTAMICIN SULFATE 40 MG/ML IJ SOLN
5.0000 mg/kg | INTRAVENOUS | Status: AC
  Administered 2024-06-22: 410.4 mg via INTRAVENOUS
  Filled 2024-06-22: qty 10.25

## 2024-06-22 MED ORDER — ONDANSETRON HCL 4 MG/2ML IJ SOLN: 4.0000 mg | Freq: Four times a day (QID) | INTRAMUSCULAR | Status: DC | PRN

## 2024-06-22 MED ORDER — CHLORHEXIDINE GLUCONATE 0.12 % MT SOLN
OROMUCOSAL | Status: AC
  Filled 2024-06-22: qty 15

## 2024-06-22 MED ORDER — METOCLOPRAMIDE HCL 10 MG PO TABS: 5.0000 mg | ORAL_TABLET | Freq: Three times a day (TID) | ORAL | Status: DC | PRN

## 2024-06-22 MED ORDER — OXYCODONE HCL 5 MG/5ML PO SOLN: 5.0000 mg | Freq: Once | ORAL | Status: AC | PRN

## 2024-06-22 SURGICAL SUPPLY — 43 items
BLADE OSC/SAGITTAL MD 5.5X18 (BLADE) ×1 IMPLANT
BLADE SURG MINI STRL (BLADE) ×1 IMPLANT
BNDG COHESIVE 4X5 TAN STRL LF (GAUZE/BANDAGES/DRESSINGS) ×1 IMPLANT
BNDG ELASTIC 4X5.8 VLCR NS LF (GAUZE/BANDAGES/DRESSINGS) ×1 IMPLANT
BNDG ESMARCH 4X12 STRL LF (GAUZE/BANDAGES/DRESSINGS) ×1 IMPLANT
BNDG GAUZE DERMACEA FLUFF 4 (GAUZE/BANDAGES/DRESSINGS) ×1 IMPLANT
BNDG STRETCH GAUZE 3IN X12FT (GAUZE/BANDAGES/DRESSINGS) ×2 IMPLANT
CUFF TOURN SGL QUICK 12 (TOURNIQUET CUFF) IMPLANT
CUFF TOURN SGL QUICK 18X4 (TOURNIQUET CUFF) IMPLANT
DRAPE FLUOR MINI C-ARM 54X84 (DRAPES) ×1 IMPLANT
DRAPE XRAY CASSETTE 23X24 (DRAPES) IMPLANT
DURAPREP 26ML APPLICATOR (WOUND CARE) ×1 IMPLANT
ELECTRODE REM PT RTRN 9FT ADLT (ELECTROSURGICAL) ×1 IMPLANT
GAUZE PACKING IODOFORM 1/2INX (GAUZE/BANDAGES/DRESSINGS) ×1 IMPLANT
GAUZE SPONGE 4X4 12PLY STRL (GAUZE/BANDAGES/DRESSINGS) ×1 IMPLANT
GAUZE STRETCH 2X75IN STRL (MISCELLANEOUS) ×1 IMPLANT
GAUZE XEROFORM 1X8 LF (GAUZE/BANDAGES/DRESSINGS) ×1 IMPLANT
GLOVE BIO SURGEON STRL SZ7.5 (GLOVE) ×1 IMPLANT
GLOVE BIOGEL PI IND STRL 8 (GLOVE) ×1 IMPLANT
GOWN STRL REUS W/ TWL XL LVL3 (GOWN DISPOSABLE) ×1 IMPLANT
GOWN STRL REUS W/TWL XL LVL4 (GOWN DISPOSABLE) ×1 IMPLANT
KIT TURNOVER KIT A (KITS) ×1 IMPLANT
LABEL OR SOLS (LABEL) ×1 IMPLANT
MANIFOLD NEPTUNE II (INSTRUMENTS) ×1 IMPLANT
NDL HYPO 25X1 1.5 SAFETY (NEEDLE) ×1 IMPLANT
NDL SAFETY ECLIP 18X1.5 (MISCELLANEOUS) ×1 IMPLANT
NEEDLE HYPO 25X1 1.5 SAFETY (NEEDLE) ×1 IMPLANT
NS IRRIG 500ML POUR BTL (IV SOLUTION) ×1 IMPLANT
PACK EXTREMITY ARMC (MISCELLANEOUS) ×1 IMPLANT
PAD ABD DERMACEA PRESS 5X9 (GAUZE/BANDAGES/DRESSINGS) ×2 IMPLANT
PENCIL SMOKE EVACUATOR (MISCELLANEOUS) ×1 IMPLANT
SHIELD FULL FACE ANTIFOG 7M (MISCELLANEOUS) ×1 IMPLANT
SOL .9 NS 3000ML IRR UROMATIC (IV SOLUTION) ×1 IMPLANT
SOLN STERILE WATER 500 ML (IV SOLUTION) ×1 IMPLANT
STOCKINETTE IMPERV 14X48 (MISCELLANEOUS) ×1 IMPLANT
STOCKINETTE M/LG 89821 (MISCELLANEOUS) ×1 IMPLANT
STRAP SAFETY 5IN WIDE (MISCELLANEOUS) ×1 IMPLANT
SUT ETHILON 5-0 FS-2 18 BLK (SUTURE) ×1 IMPLANT
SUT VIC AB 3-0 SH 27X BRD (SUTURE) IMPLANT
SUTURE EHLN 3-0 FS-10 30 BLK (SUTURE) ×1 IMPLANT
SYR 10ML LL (SYRINGE) ×3 IMPLANT
TIP FAN IRRIG PULSAVAC PLUS (DISPOSABLE) ×1 IMPLANT
TRAP FLUID SMOKE EVACUATOR (MISCELLANEOUS) ×1 IMPLANT

## 2024-06-22 NOTE — Transfer of Care (Signed)
 Immediate Anesthesia Transfer of Care Note  Patient: Lindsey Benjamin  Procedure(s) Performed: AMPUTATION, TOE (Left: Fourth Toe)  Patient Location: PACU  Anesthesia Type:General  Level of Consciousness: awake, alert , and oriented  Airway & Oxygen Therapy: Patient Spontanous Breathing  Post-op Assessment: Report given to RN and Post -op Vital signs reviewed and stable  Post vital signs: stable  Last Vitals:  Vitals Value Taken Time  BP 134/78 06/22/24 08:22  Temp 36.3 C 06/22/24 08:22  Pulse 74 06/22/24 08:27  Resp 15 06/22/24 08:27  SpO2 95 % 06/22/24 08:27  Vitals shown include unfiled device data.  Last Pain:  Vitals:   06/22/24 0822  TempSrc:   PainSc: 0-No pain         Complications: No notable events documented.

## 2024-06-22 NOTE — H&P (Signed)
 HISTORY AND PHYSICAL INTERVAL NOTE:  06/22/2024  7:17 AM  Lindsey Benjamin  has presented today for surgery, with the diagnosis of Toe deformity, left M20.62 Pain in toes of both feet M79.674, M79.675 Type 2 diabetes mellitus without complication, without long-term current use of insulin  HCC E11.9.  The various methods of treatment have been discussed with the patient.  No guarantees were given.  After consideration of risks, benefits and other options for treatment, the patient has consented to surgery.  I have reviewed the patients chart and labs.     Benjamin history and physical examination was performed in my office.  The patient was reexamined.  There have been no changes to this history and physical examination.  Lindsey Benjamin

## 2024-06-22 NOTE — Anesthesia Preprocedure Evaluation (Signed)
 "                                  Anesthesia Evaluation  Patient identified by MRN, date of birth, ID band Patient awake    Reviewed: Allergy & Precautions, NPO status , Patient's Chart, lab work & pertinent test results  History of Anesthesia Complications Negative for: history of anesthetic complications  Airway Mallampati: II  TM Distance: >3 FB Neck ROM: full    Dental  (+) Edentulous Upper, Edentulous Lower   Pulmonary sleep apnea and Continuous Positive Airway Pressure Ventilation , COPD,  COPD inhaler and oxygen dependent, Current Smoker and Patient abstained from smoking.   Pulmonary exam normal        Cardiovascular hypertension, On Medications + Past MI and +CHF  + dysrhythmias Supra Ventricular Tachycardia   ECHO 2024  IMPRESSIONS     1. Left ventricular ejection fraction, by estimation, is >55%. The left  ventricle has normal function. Left ventricular endocardial border not  optimally defined to evaluate regional wall motion. There is moderate left  ventricular hypertrophy.   2. Right ventricular systolic function is normal. The right ventricular  size is normal.   3. There is a small to moderate pericardial effusion. There is no  evidence of cardiac tamponade.     Neuro/Psych  PSYCHIATRIC DISORDERS Anxiety     negative neurological ROS     GI/Hepatic Neg liver ROS,GERD  Medicated,,  Endo/Other  diabetes, Poorly Controlled, Type 2Hypothyroidism    Renal/GU negative Renal ROS  negative genitourinary   Musculoskeletal   Abdominal   Peds  Hematology negative hematology ROS (+)   Anesthesia Other Findings Past Medical History: No date: (HFpEF) heart failure with preserved ejection fraction (HCC) No date: Anxiety No date: Asthma No date: Cardiomegaly No date: Chronic hypoxic respiratory failure, on home oxygen therapy  (HCC)     Comment:  6L [place] No date: 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 No date: COPD (chronic obstructive  pulmonary disease) (HCC) No date: DM (diabetes mellitus), type 2 (HCC) No date: GERD (gastroesophageal reflux disease) No date: Hepatic steatosis No date: History of anemia No date: Hyperlipidemia No date: Hypertension No date: Hypertrophic cardiomyopathy (HCC) No date: Hypothyroidism No date: Mixed conductive and sensorineural hearing loss of left ear No date: Morbid obesity with BMI of 40.0-44.9, adult (HCC) No date: Noncompliance with medication regimen 08/17/2023: NSTEMI (non-ST elevated myocardial infarction) [facility]     Comment:  a.) in setting of respiratory failure secondary to               AECOPD; troponin peaked at 105 ng/L; ? demand ischemia               component No date: OSA treated with BiPAP     Comment:  unable to use Bi-Pap currently due to cracked               humidifier-Using Oxygen 5 L at bedtime (06-15-24) No date: PSVT (paroxysmal supraventricular tachycardia) No date: RLS (restless legs syndrome) 08/17/2023: SIRS (systemic inflammatory response syndrome) (HCC) No date: Smoker  Past Surgical History: 11/2020: CARDIAC CATHETERIZATION No date: CESAREAN SECTION     Comment:  x2 No date: CHOLECYSTECTOMY No date: FOOT SURGERY; Left No date: INNER EAR SURGERY     Comment:  tubes in ears and repair a hole in the eardrum     Reproductive/Obstetrics negative OB ROS  Anesthesia Physical Anesthesia Plan  ASA: 3  Anesthesia Plan: General   Post-op Pain Management: Toradol IV (intra-op)* and Ofirmev  IV (intra-op)*   Induction: Intravenous  PONV Risk Score and Plan: 3 and Propofol  infusion and TIVA  Airway Management Planned: Natural Airway and Nasal Cannula  Additional Equipment:   Intra-op Plan:   Post-operative Plan:   Informed Consent: I have reviewed the patients History and Physical, chart, labs and discussed the procedure including the risks, benefits and alternatives for the proposed  anesthesia with the patient or authorized representative who has indicated his/her understanding and acceptance.     Dental Advisory Given  Plan Discussed with: Anesthesiologist, CRNA and Surgeon  Anesthesia Plan Comments: (Patient consented for risks of anesthesia including but not limited to:  - adverse reactions to medications - risk of airway placement if required - damage to eyes, teeth, lips or other oral mucosa - nerve damage due to positioning  - sore throat or hoarseness - Damage to heart, brain, nerves, lungs, other parts of body or loss of life  Patient voiced understanding and assent.)         Anesthesia Quick Evaluation  "

## 2024-06-22 NOTE — Anesthesia Postprocedure Evaluation (Signed)
"   Anesthesia Post Note  Patient: Lindsey Benjamin  Procedure(s) Performed: AMPUTATION, TOE (Left: Fourth Toe)  Patient location during evaluation: PACU Anesthesia Type: General Level of consciousness: awake and alert Pain management: pain level controlled Vital Signs Assessment: post-procedure vital signs reviewed and stable Respiratory status: spontaneous breathing, nonlabored ventilation, respiratory function stable and patient connected to nasal cannula oxygen Cardiovascular status: blood pressure returned to baseline and stable Postop Assessment: no apparent nausea or vomiting Anesthetic complications: no   No notable events documented.   Last Vitals:  Vitals:   06/22/24 0845 06/22/24 0912  BP: (!) 146/94 (!) 142/72  Pulse: (!) 58 64  Resp: 10 15  Temp: (!) 36.1 C (!) 36.1 C  SpO2: 98% 98%    Last Pain:  Vitals:   06/22/24 0920  TempSrc:   PainSc: 4                  [name]      "

## 2024-06-22 NOTE — Op Note (Signed)
 Operative note   Surgeon:Nasirah Sachs [employer]/support/administrative Officer: None    Preop diagnosis: Dislocated left fourth toe    Postop diagnosis: Same    Procedure: 1.  Amputation left fourth toe 2.  Intraoperative fluoroscopy without assistance of radiologist    EBL: 5 mL    Anesthesia: IV sedation with local.  Local consist of a total of 10 cc of one-to-one mixture of 0.5% plain bupivacaine  and plain lidocaine .    Hemostasis: None    Specimen: None    Complications: None    Operative indications:EMMER LILLIBRIDGE is an 58 y.o. that presents today for surgical intervention.  The risks/benefits/alternatives/complications have been discussed and consent has been given.    Procedure:  Patient was brought into the OR and placed on the operating table in thesupine position. After anesthesia was obtained theleft lower extremity was prepped and draped in usual sterile fashion.  Attention was directed to the left fourth toe where the dorsally dislocated toe was noted.  2 semielliptical incisions were made around the toe itself.  Full-thickness flaps were created down to the metatarsophalangeal joint and the toe was then disarticulated.  Bleeders were Bovie cauterized as appropriate.  The wound was flushed with copious amounts of irrigation.  Closure was then performed with a 3-0 Vicryl the deeper tissue and a 3-0 nylon for the skin.  A bulky sterile dressing was applied.    Patient tolerated the procedure and anesthesia well.  Was transported from the OR to the PACU with all vital signs stable and vascular status intact. To be discharged per routine protocol.  Will follow up in approximately 1 week in the outpatient clinic.

## 2024-06-25 ENCOUNTER — Encounter: Payer: Self-pay | Admitting: Podiatry

## 2024-08-10 ENCOUNTER — Other Ambulatory Visit: Payer: Self-pay
